# Patient Record
Sex: Female | Born: 1940
Health system: Southern US, Community
[De-identification: ages and names within clinical notes are randomized; demographics above are authoritative.]

## PROBLEM LIST (undated history)

## (undated) DIAGNOSIS — Z8639 Personal history of other endocrine, nutritional and metabolic disease: Secondary | ICD-10-CM

## (undated) DIAGNOSIS — F419 Anxiety disorder, unspecified: Secondary | ICD-10-CM

## (undated) DIAGNOSIS — I10 Essential (primary) hypertension: Secondary | ICD-10-CM

## (undated) DIAGNOSIS — R600 Localized edema: Secondary | ICD-10-CM

## (undated) DIAGNOSIS — K760 Fatty (change of) liver, not elsewhere classified: Secondary | ICD-10-CM

## (undated) DIAGNOSIS — M545 Low back pain, unspecified: Secondary | ICD-10-CM

## (undated) DIAGNOSIS — R2689 Other abnormalities of gait and mobility: Secondary | ICD-10-CM

## (undated) DIAGNOSIS — G4700 Insomnia, unspecified: Secondary | ICD-10-CM

## (undated) DIAGNOSIS — R55 Syncope and collapse: Secondary | ICD-10-CM

## (undated) DIAGNOSIS — R569 Unspecified convulsions: Secondary | ICD-10-CM

## (undated) DIAGNOSIS — F329 Major depressive disorder, single episode, unspecified: Secondary | ICD-10-CM

## (undated) DIAGNOSIS — F418 Other specified anxiety disorders: Secondary | ICD-10-CM

## (undated) DIAGNOSIS — C50919 Malignant neoplasm of unspecified site of unspecified female breast: Secondary | ICD-10-CM

## (undated) DIAGNOSIS — M159 Polyosteoarthritis, unspecified: Secondary | ICD-10-CM

## (undated) DIAGNOSIS — M5410 Radiculopathy, site unspecified: Secondary | ICD-10-CM

## (undated) DIAGNOSIS — L719 Rosacea, unspecified: Secondary | ICD-10-CM

## (undated) DIAGNOSIS — W19XXXA Unspecified fall, initial encounter: Secondary | ICD-10-CM

## (undated) DIAGNOSIS — Z9010 Acquired absence of unspecified breast and nipple: Secondary | ICD-10-CM

## (undated) DIAGNOSIS — M810 Age-related osteoporosis without current pathological fracture: Secondary | ICD-10-CM

## (undated) DIAGNOSIS — R251 Tremor, unspecified: Secondary | ICD-10-CM

## (undated) DIAGNOSIS — E785 Hyperlipidemia, unspecified: Secondary | ICD-10-CM

## (undated) DIAGNOSIS — C801 Malignant (primary) neoplasm, unspecified: Secondary | ICD-10-CM

## (undated) HISTORY — DX: Age-related osteoporosis without current pathological fracture: M81.0

## (undated) HISTORY — DX: Polyosteoarthritis, unspecified: M15.9

## (undated) HISTORY — DX: Rosacea, unspecified: L71.9

## (undated) HISTORY — DX: Major depressive disorder, single episode, unspecified: F32.9

## (undated) HISTORY — DX: Malignant (primary) neoplasm, unspecified: C80.1

## (undated) HISTORY — DX: Syncope and collapse: R55

## (undated) HISTORY — DX: Hyperlipidemia, unspecified: E78.5

## (undated) HISTORY — DX: Insomnia, unspecified: G47.00

## (undated) HISTORY — DX: Other specified anxiety disorders: F41.8

## (undated) HISTORY — PX: MASTECTOMY: SHX3

## (undated) HISTORY — PX: CATARACT EXTRACTION: SUR2

## (undated) HISTORY — DX: Unspecified fall, initial encounter: W19.XXXA

## (undated) HISTORY — DX: Fatty (change of) liver, not elsewhere classified: K76.0

## (undated) HISTORY — PX: TONSILLECTOMY: SUR1361

## (undated) HISTORY — DX: Localized edema: R60.0

## (undated) HISTORY — DX: Unspecified convulsions: R56.9

## (undated) HISTORY — DX: Anxiety disorder, unspecified: F41.9

## (undated) HISTORY — DX: Essential (primary) hypertension: I10

## (undated) HISTORY — DX: Personal history of other endocrine, nutritional and metabolic disease: Z86.39

## (undated) HISTORY — DX: Radiculopathy, site unspecified: M54.10

## (undated) HISTORY — DX: Other abnormalities of gait and mobility: R26.89

## (undated) HISTORY — DX: Malignant neoplasm of unspecified site of unspecified female breast: C50.919

## (undated) HISTORY — DX: Low back pain, unspecified: M54.50

## (undated) HISTORY — DX: Acquired absence of unspecified breast and nipple: Z90.10

## (undated) HISTORY — DX: Tremor, unspecified: R25.1

---
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
## 1992-06-26 DIAGNOSIS — C801 Malignant (primary) neoplasm, unspecified: Secondary | ICD-10-CM

## 1992-06-26 HISTORY — PX: BREAST SURGERY: SHX581

## 1992-06-26 HISTORY — DX: Malignant (primary) neoplasm, unspecified: C80.1

## 2000-11-21 ENCOUNTER — Ambulatory Visit (HOSPITAL_COMMUNITY): Admission: RE | Admit: 2000-11-21 | Discharge: 2000-11-21 | Payer: Self-pay | Admitting: Pulmonary Disease

## 2001-03-05 ENCOUNTER — Emergency Department (HOSPITAL_COMMUNITY): Admission: EM | Admit: 2001-03-05 | Discharge: 2001-03-05 | Payer: Self-pay | Admitting: Emergency Medicine

## 2001-03-05 ENCOUNTER — Encounter: Payer: Self-pay | Admitting: Emergency Medicine

## 2001-10-16 ENCOUNTER — Ambulatory Visit (HOSPITAL_COMMUNITY): Admission: RE | Admit: 2001-10-16 | Discharge: 2001-10-16 | Payer: Self-pay | Admitting: Pulmonary Disease

## 2001-10-22 ENCOUNTER — Ambulatory Visit (HOSPITAL_COMMUNITY): Admission: RE | Admit: 2001-10-22 | Discharge: 2001-10-22 | Payer: Self-pay | Admitting: Pulmonary Disease

## 2001-12-16 ENCOUNTER — Other Ambulatory Visit: Admission: RE | Admit: 2001-12-16 | Discharge: 2001-12-16 | Payer: Self-pay | Admitting: Gynecology

## 2002-12-19 ENCOUNTER — Other Ambulatory Visit: Admission: RE | Admit: 2002-12-19 | Discharge: 2002-12-19 | Payer: Self-pay | Admitting: Gynecology

## 2003-06-02 ENCOUNTER — Ambulatory Visit (HOSPITAL_COMMUNITY): Admission: RE | Admit: 2003-06-02 | Discharge: 2003-06-02 | Payer: Self-pay | Admitting: Pulmonary Disease

## 2003-12-23 ENCOUNTER — Other Ambulatory Visit: Admission: RE | Admit: 2003-12-23 | Discharge: 2003-12-23 | Payer: Self-pay | Admitting: Gynecology

## 2004-03-09 ENCOUNTER — Ambulatory Visit (HOSPITAL_COMMUNITY): Admission: RE | Admit: 2004-03-09 | Discharge: 2004-03-09 | Payer: Self-pay | Admitting: Internal Medicine

## 2005-01-02 ENCOUNTER — Other Ambulatory Visit: Admission: RE | Admit: 2005-01-02 | Discharge: 2005-01-02 | Payer: Self-pay | Admitting: Gynecology

## 2005-04-12 ENCOUNTER — Ambulatory Visit (HOSPITAL_COMMUNITY): Admission: RE | Admit: 2005-04-12 | Discharge: 2005-04-12 | Payer: Self-pay | Admitting: Pulmonary Disease

## 2005-04-13 ENCOUNTER — Ambulatory Visit: Payer: Self-pay | Admitting: Cardiology

## 2006-01-04 ENCOUNTER — Other Ambulatory Visit: Admission: RE | Admit: 2006-01-04 | Discharge: 2006-01-04 | Payer: Self-pay | Admitting: Gynecology

## 2006-11-20 ENCOUNTER — Ambulatory Visit: Payer: Self-pay | Admitting: Oncology

## 2007-01-04 ENCOUNTER — Ambulatory Visit: Payer: Self-pay | Admitting: Oncology

## 2007-01-23 ENCOUNTER — Encounter: Admission: RE | Admit: 2007-01-23 | Discharge: 2007-01-23 | Payer: Self-pay | Admitting: Oncology

## 2007-02-01 ENCOUNTER — Encounter: Admission: RE | Admit: 2007-02-01 | Discharge: 2007-02-01 | Payer: Self-pay | Admitting: Otolaryngology

## 2008-01-08 ENCOUNTER — Ambulatory Visit: Payer: Self-pay | Admitting: Oncology

## 2008-01-13 LAB — LACTATE DEHYDROGENASE: LDH: 177 U/L (ref 94–250)

## 2008-01-13 LAB — COMPREHENSIVE METABOLIC PANEL
Alkaline Phosphatase: 61 U/L (ref 39–117)
BUN: 22 mg/dL (ref 6–23)
CO2: 27 mEq/L (ref 19–32)
Creatinine, Ser: 0.81 mg/dL (ref 0.40–1.20)
Glucose, Bld: 88 mg/dL (ref 70–99)
Total Bilirubin: 0.4 mg/dL (ref 0.3–1.2)
Total Protein: 7.6 g/dL (ref 6.0–8.3)

## 2008-01-13 LAB — CBC WITH DIFFERENTIAL/PLATELET
Basophils Absolute: 0 10*3/uL (ref 0.0–0.1)
Eosinophils Absolute: 0.1 10*3/uL (ref 0.0–0.5)
HCT: 40.3 % (ref 34.8–46.6)
LYMPH%: 20.3 % (ref 14.0–48.0)
MCV: 87.5 fL (ref 81.0–101.0)
MONO#: 0.5 10*3/uL (ref 0.1–0.9)
MONO%: 8 % (ref 0.0–13.0)
NEUT#: 4 10*3/uL (ref 1.5–6.5)
NEUT%: 68.6 % (ref 39.6–76.8)
Platelets: 190 10*3/uL (ref 145–400)
WBC: 5.9 10*3/uL (ref 3.9–10.0)

## 2009-01-19 ENCOUNTER — Ambulatory Visit: Payer: Self-pay | Admitting: Oncology

## 2009-01-22 ENCOUNTER — Encounter: Admission: RE | Admit: 2009-01-22 | Discharge: 2009-01-22 | Payer: Self-pay | Admitting: Oncology

## 2009-01-22 LAB — CBC WITH DIFFERENTIAL/PLATELET
EOS%: 2.6 % (ref 0.0–7.0)
Eosinophils Absolute: 0.1 10*3/uL (ref 0.0–0.5)
LYMPH%: 25.2 % (ref 14.0–49.7)
MCH: 29.9 pg (ref 25.1–34.0)
MCV: 88.4 fL (ref 79.5–101.0)
MONO%: 9.8 % (ref 0.0–14.0)
Platelets: 170 10*3/uL (ref 145–400)
RBC: 4.41 10*6/uL (ref 3.70–5.45)
RDW: 14 % (ref 11.2–14.5)

## 2009-01-22 LAB — COMPREHENSIVE METABOLIC PANEL
AST: 28 U/L (ref 0–37)
Albumin: 4.5 g/dL (ref 3.5–5.2)
Alkaline Phosphatase: 79 U/L (ref 39–117)
BUN: 18 mg/dL (ref 6–23)
Glucose, Bld: 90 mg/dL (ref 70–99)
Potassium: 4.5 mEq/L (ref 3.5–5.3)
Sodium: 141 mEq/L (ref 135–145)
Total Bilirubin: 0.6 mg/dL (ref 0.3–1.2)
Total Protein: 6.8 g/dL (ref 6.0–8.3)

## 2010-02-04 ENCOUNTER — Ambulatory Visit: Payer: Self-pay | Admitting: Oncology

## 2010-02-09 ENCOUNTER — Ambulatory Visit: Payer: Self-pay | Admitting: Vascular Surgery

## 2010-02-09 ENCOUNTER — Ambulatory Visit
Admission: RE | Admit: 2010-02-09 | Discharge: 2010-02-09 | Payer: Self-pay | Source: Home / Self Care | Admitting: Oncology

## 2010-06-09 ENCOUNTER — Ambulatory Visit: Payer: Self-pay | Admitting: Otolaryngology

## 2010-11-11 NOTE — Op Note (Signed)
NAME:  Tammy Terry, Tammy Terry                          ACCOUNT NO.:  [id number]   MEDICAL RECORD NO.:  [id number]                   PATIENT TYPE:  AMB   LOCATION:  DAY                                  FACILITY:  [facility]   PHYSICIAN:  Lionel December, M.D.                 DATE OF BIRTH:  1940-08-17   DATE OF PROCEDURE:  03/09/2004  DATE OF DISCHARGE:                                 OPERATIVE REPORT   PROCEDURE:  Total colonoscopy.   INDICATIONS:  Tammy Terry is a 70 year old Caucasian female who is undergoing  screening colonoscopy. She does not have any GI symptoms. Personal history  is significant for breast carcinoma which remains in remission. There is no  family history of colon carcinoma. Procedure and risks were reviewed with  the patient and informed consent was obtained.   PREOPERATIVE MEDICATIONS:  Demerol 50 mg IV, Versed 6 mg IV in divided  doses.   FINDINGS:  Procedure performed in endoscopy suite. The patient's vital signs  and O2 saturations were monitored during procedure and remained stable. The  patient was placed in the left lateral position and rectal examination  performed. No abnormality noted on external or digital exam. Olympus video  scope was placed in the rectum and advanced into sigmoid colon and beyond.  Preparation was satisfactory. She had some liquid stool scattered here and  there which was suctioned out. Scope was advanced to the cecum which was  identified by appendiceal orifice and ileocecal valve. Picture taken for the  record. As the scope was withdrawn, colonic mucosa was once again carefully  examined, and there were no polyps and/or other abnormalities. The rectal  mucosa similarly was normal. Scope was retroflexed to examine anorectal  junction which was unremarkable. The scope was straightened and withdrawn.  The patient tolerated the procedure well.   FINAL DIAGNOSIS:  Normal colonoscopy.   RECOMMENDATIONS:  She should continue yearly Hemoccults and  consider next  reexamine 10 years for now.      ___________________________________________                                            Lionel December, M.D.   NR/MEDQ  D:  03/09/2004  T:  03/09/2004  Job:  [id number]   cc:   Gretta Cool, M.D.  [address] [place]  [place] [address]  Fax: [phone]   Oneal Deputy. Juanetta Gosling, M.D.  [address]  [place]  [place] [address]  Fax: [phone]

## 2010-12-08 ENCOUNTER — Ambulatory Visit (INDEPENDENT_AMBULATORY_CARE_PROVIDER_SITE_OTHER): Payer: Medicare Other | Admitting: Otolaryngology

## 2010-12-08 DIAGNOSIS — H60399 Other infective otitis externa, unspecified ear: Secondary | ICD-10-CM

## 2011-02-10 ENCOUNTER — Other Ambulatory Visit: Payer: Self-pay | Admitting: Oncology

## 2011-02-10 ENCOUNTER — Encounter (HOSPITAL_BASED_OUTPATIENT_CLINIC_OR_DEPARTMENT_OTHER): Payer: Medicare Other | Admitting: Oncology

## 2011-02-10 DIAGNOSIS — M7989 Other specified soft tissue disorders: Secondary | ICD-10-CM

## 2011-02-10 DIAGNOSIS — C50519 Malignant neoplasm of lower-outer quadrant of unspecified female breast: Secondary | ICD-10-CM

## 2011-02-10 LAB — COMPREHENSIVE METABOLIC PANEL
ALT: 15 U/L (ref 0–35)
AST: 18 U/L (ref 0–37)
Albumin: 4.3 g/dL (ref 3.5–5.2)
Alkaline Phosphatase: 67 U/L (ref 39–117)
Calcium: 10.5 mg/dL (ref 8.4–10.5)
Chloride: 101 mEq/L (ref 96–112)
Potassium: 4 mEq/L (ref 3.5–5.3)
Sodium: 138 mEq/L (ref 135–145)
Total Protein: 7.4 g/dL (ref 6.0–8.3)

## 2011-02-10 LAB — PROTIME-INR
INR: 0.9 — ABNORMAL LOW (ref 2.00–3.50)
Protime: 10.8 Seconds (ref 10.6–13.4)

## 2011-02-10 LAB — CBC WITH DIFFERENTIAL/PLATELET
BASO%: 0.4 % (ref 0.0–2.0)
Basophils Absolute: 0 10*3/uL (ref 0.0–0.1)
EOS%: 1.8 % (ref 0.0–7.0)
HGB: 12.8 g/dL (ref 11.6–15.9)
MCH: 30.6 pg (ref 25.1–34.0)
MCHC: 34.2 g/dL (ref 31.5–36.0)
MCV: 89.6 fL (ref 79.5–101.0)
MONO%: 7.3 % (ref 0.0–14.0)
RBC: 4.18 10*6/uL (ref 3.70–5.45)
RDW: 14.1 % (ref 11.2–14.5)
lymph#: 1.1 10*3/uL (ref 0.9–3.3)

## 2011-06-01 ENCOUNTER — Ambulatory Visit (INDEPENDENT_AMBULATORY_CARE_PROVIDER_SITE_OTHER): Payer: Medicare Other | Admitting: Otolaryngology

## 2011-06-01 DIAGNOSIS — H903 Sensorineural hearing loss, bilateral: Secondary | ICD-10-CM

## 2012-01-10 ENCOUNTER — Encounter: Payer: Self-pay | Admitting: Oncology

## 2012-01-19 ENCOUNTER — Telehealth: Payer: Self-pay | Admitting: Oncology

## 2012-01-19 NOTE — Telephone Encounter (Signed)
lmonvm for pt confirming appt for 9/6. Also checked w/Mlissa @ [facility] and per [place] pt has annual mammo 12/25/11.

## 2012-01-19 NOTE — Telephone Encounter (Signed)
Pt called back to let me know she did get my message re 9/6 appt and she had her mammo 12/25/11 @ [facility].

## 2012-02-02 ENCOUNTER — Ambulatory Visit: Payer: Medicare Other | Admitting: Oncology

## 2012-03-01 ENCOUNTER — Telehealth: Payer: Self-pay | Admitting: Oncology

## 2012-03-01 ENCOUNTER — Other Ambulatory Visit (HOSPITAL_BASED_OUTPATIENT_CLINIC_OR_DEPARTMENT_OTHER): Payer: Medicare Other | Admitting: Lab

## 2012-03-01 ENCOUNTER — Ambulatory Visit (HOSPITAL_BASED_OUTPATIENT_CLINIC_OR_DEPARTMENT_OTHER): Payer: Medicare Other | Admitting: Oncology

## 2012-03-01 ENCOUNTER — Encounter: Payer: Self-pay | Admitting: Oncology

## 2012-03-01 VITALS — BP 186/95 | HR 59 | Temp 97.3°F | Resp 18 | Ht 66.0 in | Wt 170.9 lb

## 2012-03-01 DIAGNOSIS — F341 Dysthymic disorder: Secondary | ICD-10-CM

## 2012-03-01 DIAGNOSIS — E785 Hyperlipidemia, unspecified: Secondary | ICD-10-CM | POA: Insufficient documentation

## 2012-03-01 DIAGNOSIS — C50919 Malignant neoplasm of unspecified site of unspecified female breast: Secondary | ICD-10-CM | POA: Insufficient documentation

## 2012-03-01 DIAGNOSIS — M7989 Other specified soft tissue disorders: Secondary | ICD-10-CM

## 2012-03-01 DIAGNOSIS — Z853 Personal history of malignant neoplasm of breast: Secondary | ICD-10-CM

## 2012-03-01 DIAGNOSIS — C50519 Malignant neoplasm of lower-outer quadrant of unspecified female breast: Secondary | ICD-10-CM

## 2012-03-01 DIAGNOSIS — F418 Other specified anxiety disorders: Secondary | ICD-10-CM

## 2012-03-01 HISTORY — DX: Hyperlipidemia, unspecified: E78.5

## 2012-03-01 HISTORY — DX: Other specified anxiety disorders: F41.8

## 2012-03-01 LAB — LACTATE DEHYDROGENASE (CC13): LDH: 176 U/L (ref 125–220)

## 2012-03-01 LAB — COMPREHENSIVE METABOLIC PANEL (CC13)
ALT: 13 U/L (ref 0–55)
Albumin: 4.1 g/dL (ref 3.5–5.0)
CO2: 27 mEq/L (ref 22–29)
Chloride: 105 mEq/L (ref 98–107)
Potassium: 4.3 mEq/L (ref 3.5–5.1)
Sodium: 142 mEq/L (ref 136–145)
Total Bilirubin: 0.8 mg/dL (ref 0.20–1.20)
Total Protein: 6.8 g/dL (ref 6.4–8.3)

## 2012-03-01 LAB — CBC WITH DIFFERENTIAL/PLATELET
BASO%: 0.5 % (ref 0.0–2.0)
EOS%: 1.5 % (ref 0.0–7.0)
HCT: 41 % (ref 34.8–46.6)
LYMPH%: 18.3 % (ref 14.0–49.7)
MCH: 30.2 pg (ref 25.1–34.0)
MCHC: 33 g/dL (ref 31.5–36.0)
NEUT%: 70.6 % (ref 38.4–76.8)
RBC: 4.48 10*6/uL (ref 3.70–5.45)
lymph#: 1.1 10*3/uL (ref 0.9–3.3)

## 2012-03-01 NOTE — Progress Notes (Signed)
Hematology and Oncology Follow Up Visit  Tammy Terry [id number] 02-Aug-1940 71 y.o. 03/01/2012 3:51 PM   Principle Diagnosis: Encounter Diagnoses  Name Primary?  . Breast cancer, stage 3 Yes  . Depression with anxiety   . Hyperlipidemia      Interim History:   Follow-up visit for this pleasant 71 year old lady with a history of stage III invasive lobular carcinoma of the left breast diagnosed in April 1994, 5-cm primary, ER/PR positive, one node positive, positive nipple involvement.  Positive lymphatic invasion.  She was treated with left mastectomy, eight cycles of CMF chemotherapy, chest wall radiation, and 5 years of tamoxifen.  She has done extremely well with no signs of obvious recurrence and now out over 19 years. I have given her the option of graduating from our office that she still likes to see me once a year.  Medically she has had no interim problems but psychologically she has become significantly depressed and anxious. She really doesn't understand why. There have been no major events that might have brought on a depression. She is feeling more and more isolated and feels she can't be separated from her husband for any length of time. 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 She has been on chronic low-dose Xanax 0.5 mg twice daily and Celexa 10 mg daily. She did see a psychologist in the past but didn't feel that this helped and stopped going after 2 visits. She has discussed her depression with her gynecologist and will be seeing her primary care physician next week.  She denies any headache. She has had a change in vision. She had a left cataract surgery a few months ago. This did not improve her vision. Which affects both eyes. She is reluctant to have the right cataract surgery done. She denies any bone pain. No change in bowel habit. No vaginal bleeding.  Medications: reviewed  Allergies:  Allergies  Allergen  Reactions  . Amoxicillin   . Codeine     Review of Systems: Constitutional: No constitutional symptoms   Respiratory: No cough or dyspnea Cardiovascular:  No chest pain or palpitations Gastrointestinal: See above Genito-Urinary: See above  Musculoskeletal: See above Neurologic: See above Skin: No rash or ecchymosis Remaining ROS negative.  Physical Exam: Blood pressure 186/95, pulse 59, temperature 97.3 F (36.3 C), temperature source Oral, resp. rate 18, height 5\' 6"  (1.676 m), weight 170 lb 14.4 oz (77.52 kg). Wt Readings from Last 3 Encounters:  03/01/12 170 lb 14.4 oz (77.52 kg)     General appearance: Well-nourished Caucasian woman HENNT: Pharynx no erythema or exudate Lymph nodes: No lymphadenopathy Breasts: Left mastectomy, no chest wall lesions, no right breast masses. Lungs: Clear to auscultation resonant to percussion Heart: Regular rhythm no murmur Abdomen: Soft nontender no mass no organomegaly Extremities: No edema no calf tenderness Vascular: No cyanosis Neurologic: Motor strength 5 over 5, reflexes 1+ symmetric Skin: No rash or ecchymosis  Lab Results: Lab Results  Component Value Date   WBC 6.0 03/01/2012   HGB 13.5 03/01/2012   HCT 41.0 03/01/2012   MCV 91.5 03/01/2012   PLT 141* 03/01/2012     Chemistry      Component Value Date/Time   NA 142 03/01/2012 1009   NA 138 02/10/2011 1529   K 4.3 03/01/2012 1009   K 4.0 02/10/2011 1529   CL 105 03/01/2012 1009   CL 101 02/10/2011 1529   CO2 27  03/01/2012 1009   CO2 27 02/10/2011 1529   BUN 20.0 03/01/2012 1009   BUN 19 02/10/2011 1529   CREATININE 0.8 03/01/2012 1009   CREATININE 0.73 02/10/2011 1529      Component Value Date/Time   CALCIUM 9.8 03/01/2012 1009   CALCIUM 10.5 02/10/2011 1529   ALKPHOS 73 03/01/2012 1009   ALKPHOS 67 02/10/2011 1529   AST 14 03/01/2012 1009   AST 18 02/10/2011 1529   ALT 13 03/01/2012 1009   ALT 15 02/10/2011 1529   BILITOT 0.80 03/01/2012 1009   BILITOT 0.4 02/10/2011 1529        Radiological Studies: Mammograms done back in January 28, 2023 on the right breast showed no new disease   Impression and Plan:  #1. Stage IIIA invasive lobular carcinoma left breast treated as outlined above. She remains free of any obvious new disease now out a remarkable 19 years. Plan: Continue annual followup  #2. Depression with anxiety. I think she might benefit from psychiatric evaluation.. I gave her the name of Dr. Geralyn Flash a good psychiatrist in [place] where she lives. She'll discuss this with her primary care physician next week.   CC:. Dr. Kari Baars; Beather Arbour; Charlean Merl, MD 9/6/20133:51 PM

## 2012-03-01 NOTE — Telephone Encounter (Signed)
[facility] @ [facility] for mammogram appt on 12/30/12 10 am date per patient's request. Order was also faxed to [facility] scheduling Pt will see MD with lab on 01/31/13

## 2012-05-30 ENCOUNTER — Ambulatory Visit (INDEPENDENT_AMBULATORY_CARE_PROVIDER_SITE_OTHER): Payer: Medicare Other | Admitting: Otolaryngology

## 2012-05-30 DIAGNOSIS — H903 Sensorineural hearing loss, bilateral: Secondary | ICD-10-CM

## 2012-05-30 DIAGNOSIS — H6980 Other specified disorders of Eustachian tube, unspecified ear: Secondary | ICD-10-CM

## 2012-05-30 DIAGNOSIS — H93299 Other abnormal auditory perceptions, unspecified ear: Secondary | ICD-10-CM

## 2012-09-11 ENCOUNTER — Other Ambulatory Visit (HOSPITAL_COMMUNITY): Payer: Self-pay | Admitting: Pulmonary Disease

## 2012-09-17 ENCOUNTER — Ambulatory Visit (HOSPITAL_COMMUNITY)
Admission: RE | Admit: 2012-09-17 | Discharge: 2012-09-17 | Disposition: A | Discharge status: Home / Self Care | Payer: Medicare Other | Source: Ambulatory Visit | Attending: Pulmonary Disease | Admitting: Pulmonary Disease

## 2012-09-17 DIAGNOSIS — K7689 Other specified diseases of liver: Secondary | ICD-10-CM | POA: Insufficient documentation

## 2012-09-17 DIAGNOSIS — R748 Abnormal levels of other serum enzymes: Secondary | ICD-10-CM | POA: Insufficient documentation

## 2012-10-10 ENCOUNTER — Encounter (INDEPENDENT_AMBULATORY_CARE_PROVIDER_SITE_OTHER): Payer: Self-pay | Admitting: *Deleted

## 2012-10-22 ENCOUNTER — Encounter (INDEPENDENT_AMBULATORY_CARE_PROVIDER_SITE_OTHER): Payer: Self-pay | Admitting: Internal Medicine

## 2012-10-22 ENCOUNTER — Ambulatory Visit (INDEPENDENT_AMBULATORY_CARE_PROVIDER_SITE_OTHER): Payer: Medicare Other | Admitting: Internal Medicine

## 2012-10-22 VITALS — BP 144/60 | HR 76 | Ht 65.0 in | Wt 171.6 lb

## 2012-10-22 DIAGNOSIS — I10 Essential (primary) hypertension: Secondary | ICD-10-CM | POA: Insufficient documentation

## 2012-10-22 DIAGNOSIS — K7689 Other specified diseases of liver: Secondary | ICD-10-CM

## 2012-10-22 DIAGNOSIS — K760 Fatty (change of) liver, not elsewhere classified: Secondary | ICD-10-CM

## 2012-10-22 DIAGNOSIS — R748 Abnormal levels of other serum enzymes: Secondary | ICD-10-CM | POA: Insufficient documentation

## 2012-10-22 LAB — COMPREHENSIVE METABOLIC PANEL
ALT: 24 U/L (ref 0–35)
AST: 18 U/L (ref 0–37)
Chloride: 102 mEq/L (ref 96–112)
Creat: 0.71 mg/dL (ref 0.50–1.10)
Total Bilirubin: 0.8 mg/dL (ref 0.3–1.2)

## 2012-10-22 NOTE — Progress Notes (Signed)
Subjective:     Patient ID: Tammy Terry, female   DOB: 08/11/1940, 72 y.o.   MRN: [id number]  Tammy Terry is a 72 yr old female referred to our office for elevated liver enzymes. She was also noted have a fatty liver. She denies prior hx of elevated liver enzymes. She has been off the cholesterol medication since February of this year. Appetite is good. No weight.  No abdominal pain. BMs are normal; every 3 days.   09/11/2012 US abdomen: IMPRESSION:  Fatty infiltration of the liver. No other abnormality seen in the  abdomen.   08/23/2012 ALP 94, AST 36, ALT 46  CMP     Component Value Date/Time   NA 142 03/01/2012 1009   NA 138 02/10/2011 1529   K 4.3 03/01/2012 1009   K 4.0 02/10/2011 1529   CL 105 03/01/2012 1009   CL 101 02/10/2011 1529   CO2 27 03/01/2012 1009   CO2 27 02/10/2011 1529   GLUCOSE 69* 03/01/2012 1009   GLUCOSE 96 02/10/2011 1529   BUN 20.0 03/01/2012 1009   BUN 19 02/10/2011 1529   CREATININE 0.8 03/01/2012 1009   CREATININE 0.73 02/10/2011 1529   CALCIUM 9.8 03/01/2012 1009   CALCIUM 10.5 02/10/2011 1529   PROT 6.8 03/01/2012 1009   PROT 7.4 02/10/2011 1529   ALBUMIN 4.1 03/01/2012 1009   ALBUMIN 4.3 02/10/2011 1529   AST 14 03/01/2012 1009   AST 18 02/10/2011 1529   ALT 13 03/01/2012 1009   ALT 15 02/10/2011 1529   ALKPHOS 73 03/01/2012 1009   ALKPHOS 67 02/10/2011 1529   BILITOT 0.80 03/01/2012 1009   BILITOT 0.4 02/10/2011 1529      Review of Systems see hpi Current Outpatient Prescriptions  Medication Sig Dispense Refill  . amLODipine (NORVASC) 2.5 MG tablet Take 2.5 mg by mouth daily.      Marland Kitchen aspirin 81 MG tablet Take 81 mg by mouth daily.      . Cholecalciferol (VITAMIN D-3 PO) Take 2,000 Int'l Units by mouth daily.      . citalopram (CELEXA) 10 MG tablet Take 20 mg by mouth daily.       . clonazePAM (KLONOPIN) 0.5 MG tablet Take 0.5 mg by mouth 2 (two) times daily as needed for anxiety.      . fish oil-omega-3 fatty acids 1000 MG capsule Take 1 g by mouth daily.      . Misc  Natural Products (OSTEO BI-FLEX ADV TRIPLE ST PO) Take 2 tablets by mouth daily.      . polyethylene glycol (MIRALAX / GLYCOLAX) packet Take 17 g by mouth daily as needed.       No current facility-administered medications for this visit.   Past Medical History  Diagnosis Date  . Depression with anxiety 03/01/2012  . Hyperlipidemia 03/01/2012  . Hypertension    Past Surgical History  Procedure Laterality Date  . Mastectomy      left breast for cancer in 1994   Allergies  Allergen Reactions  . Amoxicillin   . Codeine         Objective:   Physical Exam  Filed Vitals:   10/22/12 1021  BP: 144/60  Pulse: 76  Height: 5\' 5"  (1.651 m)  Weight: 171 lb 9.6 oz (77.837 kg)   Alert and oriented. Skin warm and dry. Oral mucosa is moist.   . Sclera anicteric, conjunctivae is pink. Thyroid not enlarged. No cervical lymphadenopathy. Lungs clear. Heart regular rate and rhythm.  Abdomen  is soft.  Abdomen obese. Bowel sounds are positive. No hepatomegaly. No abdominal masses felt. No tenderness.  No edema to lower extremities.       Assessment:    Elevated transaminases. ALT slightly elevated. Fatty infiltration of the liver on [place].  Zocor on hold.     Plan:    Repeat Cmet today. OV in 3 months with a Cmet. Patient needs to diet and exercise.

## 2012-10-22 NOTE — Patient Instructions (Addendum)
Diet and exercise. Cmet in 3 months

## 2012-10-23 ENCOUNTER — Telehealth (INDEPENDENT_AMBULATORY_CARE_PROVIDER_SITE_OTHER): Payer: Self-pay | Admitting: *Deleted

## 2012-10-23 DIAGNOSIS — K760 Fatty (change of) liver, not elsewhere classified: Secondary | ICD-10-CM

## 2012-10-23 NOTE — Telephone Encounter (Signed)
.  Per Terri Setzer,NP 

## 2012-10-30 ENCOUNTER — Encounter (INDEPENDENT_AMBULATORY_CARE_PROVIDER_SITE_OTHER): Payer: Self-pay

## 2012-12-18 ENCOUNTER — Encounter (INDEPENDENT_AMBULATORY_CARE_PROVIDER_SITE_OTHER): Payer: Self-pay | Admitting: *Deleted

## 2012-12-18 ENCOUNTER — Other Ambulatory Visit (INDEPENDENT_AMBULATORY_CARE_PROVIDER_SITE_OTHER): Payer: Self-pay | Admitting: *Deleted

## 2012-12-18 DIAGNOSIS — K760 Fatty (change of) liver, not elsewhere classified: Secondary | ICD-10-CM

## 2013-01-02 ENCOUNTER — Telehealth: Payer: Self-pay | Admitting: *Deleted

## 2013-01-02 NOTE — Telephone Encounter (Signed)
Received call from [facility] stating pt needs a recall for diagnostic & has made an appt for tues @ 10am & needs an order.  Returned call & asked her to fax order for MD to sign.

## 2013-01-13 ENCOUNTER — Telehealth: Payer: Self-pay | Admitting: *Deleted

## 2013-01-13 ENCOUNTER — Other Ambulatory Visit: Payer: Self-pay | Admitting: Oncology

## 2013-01-13 DIAGNOSIS — R921 Mammographic calcification found on diagnostic imaging of breast: Secondary | ICD-10-CM

## 2013-01-13 NOTE — Telephone Encounter (Signed)
Patient presents to office asking for advice on how to schedule right breast core biopsy that was suggested by [facility] at [facility]. Had called office and not heard back last week. Made her aware that nurse had attempted to reach her several times last week at her home # (confirmed #) without success. Today this RN called her home and was only able to get rapid busy signal. Patient will have her phone checked. Was provided with her cell [phone] as back up. Left messages with [facility] of [place] [phone] ext [phone] to contact our office/patient to schedule core biopsy if they are able to do this for patient. Also instructed patient to call them again when she returns home and give her cell # in addition to home # for contact. She will call our office when biopsy is scheduled to determine if Dr. Cyndie Chime wants to move her 01/31/13 annual office visit.

## 2013-01-13 NOTE — Telephone Encounter (Signed)
[facility] called and said they will contact patient to set up biopsy at the [facility] in [place]. Will send orders over for Dr. Cyndie Chime to sign.

## 2013-01-15 ENCOUNTER — Telehealth: Payer: Self-pay | Admitting: *Deleted

## 2013-01-15 NOTE — Telephone Encounter (Signed)
Patient called and left message that her right breast biopsy is scheduled for Wed. 7/30 at 3pm at the [facility]  She thinks her phone is fine.  She does have a cell phone but it is not turned on all the time.  She left her email so we could communicate.  Tried to call her back and let her know that we need to communicate with her by phone, we are unable to use personal email for communication.  No answer at her home phone and unable to leave a message on this number. No answer on cell number and no mailbox set up on it.

## 2013-01-18 LAB — COMPREHENSIVE METABOLIC PANEL
CO2: 30 mEq/L (ref 19–32)
Creat: 0.74 mg/dL (ref 0.50–1.10)
Glucose, Bld: 90 mg/dL (ref 70–99)
Total Bilirubin: 0.8 mg/dL (ref 0.3–1.2)

## 2013-01-21 ENCOUNTER — Ambulatory Visit (INDEPENDENT_AMBULATORY_CARE_PROVIDER_SITE_OTHER): Payer: Medicare Other | Admitting: Internal Medicine

## 2013-01-22 ENCOUNTER — Ambulatory Visit
Admission: RE | Admit: 2013-01-22 | Discharge: 2013-01-22 | Disposition: A | Discharge status: Home / Self Care | Payer: Medicare Other | Source: Ambulatory Visit | Attending: Oncology | Admitting: Oncology

## 2013-01-22 ENCOUNTER — Encounter: Payer: Self-pay | Admitting: Oncology

## 2013-01-22 DIAGNOSIS — R921 Mammographic calcification found on diagnostic imaging of breast: Secondary | ICD-10-CM

## 2013-01-27 ENCOUNTER — Ambulatory Visit: Payer: Self-pay | Admitting: Gynecology

## 2013-01-27 ENCOUNTER — Encounter: Payer: Self-pay | Admitting: Gynecology

## 2013-01-27 ENCOUNTER — Ambulatory Visit (INDEPENDENT_AMBULATORY_CARE_PROVIDER_SITE_OTHER): Payer: Medicare Other | Admitting: Gynecology

## 2013-01-27 VITALS — BP 146/86 | Ht 64.5 in | Wt 166.0 lb

## 2013-01-27 DIAGNOSIS — N951 Menopausal and female climacteric states: Secondary | ICD-10-CM

## 2013-01-27 DIAGNOSIS — M949 Disorder of cartilage, unspecified: Secondary | ICD-10-CM

## 2013-01-27 DIAGNOSIS — N952 Postmenopausal atrophic vaginitis: Secondary | ICD-10-CM

## 2013-01-27 DIAGNOSIS — Z780 Asymptomatic menopausal state: Secondary | ICD-10-CM

## 2013-01-27 DIAGNOSIS — M8580 Other specified disorders of bone density and structure, unspecified site: Secondary | ICD-10-CM

## 2013-01-27 DIAGNOSIS — Z853 Personal history of malignant neoplasm of breast: Secondary | ICD-10-CM

## 2013-01-27 NOTE — Progress Notes (Signed)
Tammy Terry 10/18/1940 [id number]   History:    72 y.o. new patient to the practice who was previously been followed by Dr. Nicholas Lose who has retired. Patient with long-standing history of osteopenia as well as vaginal atrophy. Patient is here for gynecological exam.  Patient withhistory of stage III invasive lobular carcinoma of the left breast diagnosed in April 1994, 5-cm primary, ER/PR positive, one node positive, positive nipple involvement. Positive lymphatic invasion. She was treated with left mastectomy, eight cycles of CMF chemotherapy, chest wall radiation, and 5 years of tamoxifen. She has been followed by Dr. Cyndie Chime   her oncologist. Early this year patient had a right breast biopsy the pathology report was fibroadenoma.  Patient does suffer from depression and anxiety this being followed by her psychiatrist. she has been taking Xanax 0.5 mg twice a day when necessary  For anxiety along with Celexa 10 mg daily.  Patient would know prior history of abnormal Pap smears. Her primary physician is Dr. Kari Baars who has been doing her lab work in treating her for hypercholesterolemia. Patient's last colonoscopy was in 2005. Her last bone density study was in 2012 with osteopenia being diagnosed.    Past medical history,surgical history, family history and social history were all reviewed and documented in the EPIC chart.  Gynecologic History No LMP recorded. Patient is postmenopausal. Contraception: post menopausal status Last Pap: 2010. Results were: normal Last mammogram: see above. Results were: see above  Obstetric History OB History   Grav Para Term Preterm Abortions TAB SAB Ect Mult Living   1 1        1      # Outc Date GA Lbr Len/2nd Wgt Sex Del Anes PTL Lv   1 PAR                ROS: A ROS was performed and pertinent positives and negatives are included in the history.  GENERAL: No fevers or chills. HEENT: No change in vision, no earache, sore throat or sinus  congestion. NECK: No pain or stiffness. CARDIOVASCULAR: No chest pain or pressure. No palpitations. PULMONARY: No shortness of breath, cough or wheeze. GASTROINTESTINAL: No abdominal pain, nausea, vomiting or diarrhea, melena or bright red blood per rectum. GENITOURINARY: No urinary frequency, urgency, hesitancy or dysuria. MUSCULOSKELETAL: No joint or muscle pain, no back pain, no recent trauma. DERMATOLOGIC: No rash, no itching, no lesions. ENDOCRINE: No polyuria, polydipsia, no heat or cold intolerance. No recent change in weight. HEMATOLOGICAL: No anemia or easy bruising or bleeding. NEUROLOGIC: No headache, seizures, numbness, tingling or weakness. PSYCHIATRIC: No depression, no loss of interest in normal activity or change in sleep pattern.     Exam: chaperone present  BP 146/86  Ht 5' 4.5" (1.638 m)  Wt 166 lb (75.297 kg)  BMI 28.06 kg/m2  Body mass index is 28.06 kg/(m^2).  General appearance : Well developed well nourished female. No acute distress HEENT: Neck supple, trachea midline, no carotid bruits, no thyroidmegaly Lungs: Clear to auscultation, no rhonchi or wheezes, or rib retractions  Heart: Regular rate and rhythm, no murmurs or gallops Breast:Examined in sitting and supine position were symmetrical in appearance, no palpable masses or tenderness,  no skin retraction, no nipple inversion, no nipple discharge, no skin discoloration, no axillary or supraclavicular lymphadenopathy Abdomen: no palpable masses or tenderness, no rebound or guarding Extremities: no edema or skin discoloration or tenderness  Pelvic:  Bartholin, Urethra, Skene Glands: Within normal limits  Vagina: No gross lesions or discharge,atrophic changes  Cervix: No gross lesions or discharge  Uterus  axial, normal size, shape and consistency, non-tender and mobile  Adnexa  Without masses or tenderness  Anus and perineum  normal   Rectovaginal  normal sphincter tone without palpated masses or  tenderness             Hemoccult cords provided     Assessment/Plan:  72 y.o. female with history of osteopenia overdue for her bone density study which she will schedule. She will bring to the office the same day the Hemoccult cards for testing. I gave her information on Evista which she would benefit since she has had history of breast cancer and  with history of osteopenia. We will discuss further after the upcoming bone density study. Patient did state that she had been several years ago on Fosamax for greater than 5 years. Pap smear not done today for a new guidelines were discussed. She was reminded to continue doing monthly self breast examination. We discussed importance of calcium vitamin D and regular exercise for osteoporosis prevention.   Ok Edwards MD, 1:21 PM 01/27/2013

## 2013-01-27 NOTE — Patient Instructions (Addendum)
Raloxifene tablets What is this medicine? RALOXIFENE (ral OX i feen) reduces the amount of calcium lost from bones. It is used to treat and prevent osteoporosis in women who have experienced menopause. This medicine may be used for other purposes; ask your health care provider or pharmacist if you have questions. What should I tell my health care provider before I take this medicine? They need to know if you have any of these conditions: -a history of blood clots -cancer -heart failure -liver disease -premenopausal -an unusual or allergic reaction to raloxifene, other medicines, foods, dyes, or preservatives -pregnant or trying to get pregnant -breast-feeding How should I use this medicine? Take this medicine by mouth with a glass of water. Follow the directions on the prescription label. The tablets can be taken with or without food. Take your doses at regular intervals. Do not take your medicine more often than directed. Talk to your pediatrician regarding the use of this medicine in children. Special care may be needed. Overdosage: If you think you have taken too much of this medicine contact a poison control center or emergency room at once. NOTE: This medicine is only for you. Do not share this medicine with others. What if I miss a dose? If you miss a dose, take it as soon as you can. If it is almost time for your next dose, take only that dose. Do not take double or extra doses. What may interact with this medicine? -ampicillin -cholestyramine -colestipol -diazepam -diazoxide -female hormones like hormone replacement therapy -lidocaine -warfarin This list may not describe all possible interactions. Give your health care provider a list of all the medicines, herbs, non-prescription drugs, or dietary supplements you use. Also tell them if you smoke, drink alcohol, or use illegal drugs. Some items may interact with your medicine. What should I watch for while using this  medicine? Visit your doctor or health care professional for regular checks on your progress. Do not stop taking this medicine except on the advice of your doctor or health care professional. You should make sure you get enough calcium and vitamin D in your diet while you are taking this medicine. Discuss your dietary needs with your health care professional or nutritionist. Exercise may help to prevent bone loss. Discuss your exercise needs with your doctor or health care professional. This medicine can rarely cause blood clots. You should avoid long periods of bed rest while taking this medicine. If you are going to have surgery, tell your doctor or health care professional that you are taking this medicine. This medicine should be stopped at least 3 days before surgery. After surgery, it should be restarted only after you are walking again. It should not be restarted while you still need long periods of bed rest. You should not smoke while taking this medicine. Smoking may also increase your risk of blood clots. Smoking can also decrease the effects of this medicine. This medicine does not prevent hot flashes. It may cause hot flashes in some patients at the start of therapy. What side effects may I notice from receiving this medicine? Side effects that you should report to your doctor or health care professional as soon as possible: -change in vision -chest pain -difficulty breathing -leg pain or swelling -skin rash, itching Side effects that usually do not require medical attention (report to your doctor or health care professional if they continue or are bothersome): -fluid build-up -leg cramps -stomach pain -sweating This list may not describe all possible side  effects. Call your doctor for medical advice about side effects. You may report side effects to FDA at 1-800-FDA-1088. Where should I keep my medicine? Keep out of the reach of children. Store at room temperature between 15 and 30  degrees C (59 and 86 degrees F). Throw away any unused medicine after the expiration date. NOTE: This sheet is a summary. It may not cover all possible information. If you have questions about this medicine, talk to your doctor, pharmacist, or health care provider.  2013, [employer]vier/Gold Standard. (05/28/2008 3:15:14 PM) Bone Densitometry Bone densitometry is a special X-ray that measures your bone density and can be used to help predict your risk of bone fractures. This test is used to determine bone mineral content and density to diagnose osteoporosis. Osteoporosis is the loss of bone that may cause the bone to become weak. Osteoporosis commonly occurs in women entering menopause. However, it may be found in men and in people with other diseases. PREPARATION FOR TEST No preparation necessary. WHO SHOULD BE TESTED?  All women older than 85.  Postmenopausal women (50 to 44) with risk factors for osteoporosis.  People with a previous fracture caused by normal activities.  People with a small body frame (less than 127 poundsor a body mass index [BMI] of less than 21).  People who have a parent with a hip fracture or history of osteoporosis.  People who smoke.  People who have rheumatoid arthritis.  Anyone who engages in excessive alcohol use (more than 3 drinks most days).  Women who experience early menopause. WHEN SHOULD YOU BE RETESTED? Current guidelines suggest that you should wait at least 2 years before doing a bone density test again if your first test was normal.Recent studies indicated that women with normal bone density may be able to wait a few years before needing to repeat a bone density test. You should discuss this with your caregiver.  NORMAL FINDINGS   Normal: less than standard deviation below normal (greater than -1).  Osteopenia: 1 to 2.5 standard deviations below normal (-1 to -2.5).  Osteoporosis: greater than 2.5 standard deviations below normal (less than  -2.5). Test results are reported as a "T score" and a "Z score."The T score is a number that compares your bone density with the bone density of healthy, young women.The Z score is a number that compares your bone density with the scores of women who are the same age, gender, and race.  Ranges for normal findings may vary among different laboratories and hospitals. You should always check with your doctor after having lab work or other tests done to discuss the meaning of your test results and whether your values are considered within normal limits. MEANING OF TEST  Your caregiver will go over the test results with you and discuss the importance and meaning of your results, as well as treatment options and the need for additional tests if necessary. OBTAINING THE TEST RESULTS It is your responsibility to obtain your test results. Ask the lab or department performing the test when and how you will get your results. Document Released: 07/04/2004 Document Revised: 09/04/2011 Document Reviewed: 07/27/2010 [facility] Patient Information 2014 [place], [place].

## 2013-01-29 ENCOUNTER — Telehealth: Payer: Self-pay | Admitting: *Deleted

## 2013-01-29 NOTE — Telephone Encounter (Signed)
Received message from pt asking to confirm appt for 01/31/13.  Attempted several times home # --received busy signal; when it did ring--no answering machine.  Will continue to try and confirm appt for pt.

## 2013-01-31 ENCOUNTER — Telehealth: Payer: Self-pay | Admitting: Oncology

## 2013-01-31 ENCOUNTER — Ambulatory Visit (HOSPITAL_BASED_OUTPATIENT_CLINIC_OR_DEPARTMENT_OTHER): Payer: Medicare Other | Admitting: Oncology

## 2013-01-31 ENCOUNTER — Other Ambulatory Visit (HOSPITAL_BASED_OUTPATIENT_CLINIC_OR_DEPARTMENT_OTHER): Payer: Medicare Other | Admitting: Lab

## 2013-01-31 VITALS — BP 151/72 | HR 61 | Temp 97.9°F | Resp 18 | Ht 64.0 in | Wt 167.4 lb

## 2013-01-31 DIAGNOSIS — F341 Dysthymic disorder: Secondary | ICD-10-CM

## 2013-01-31 DIAGNOSIS — Z853 Personal history of malignant neoplasm of breast: Secondary | ICD-10-CM

## 2013-01-31 DIAGNOSIS — C50912 Malignant neoplasm of unspecified site of left female breast: Secondary | ICD-10-CM

## 2013-01-31 DIAGNOSIS — C50919 Malignant neoplasm of unspecified site of unspecified female breast: Secondary | ICD-10-CM

## 2013-01-31 LAB — COMPREHENSIVE METABOLIC PANEL (CC13)
ALT: 12 U/L (ref 0–55)
BUN: 20.9 mg/dL (ref 7.0–26.0)
CO2: 27 mEq/L (ref 22–29)
Calcium: 9.8 mg/dL (ref 8.4–10.4)
Creatinine: 0.8 mg/dL (ref 0.6–1.1)
Total Bilirubin: 0.68 mg/dL (ref 0.20–1.20)

## 2013-01-31 LAB — CBC WITH DIFFERENTIAL/PLATELET
BASO%: 0.8 % (ref 0.0–2.0)
Basophils Absolute: 0 10*3/uL (ref 0.0–0.1)
EOS%: 1.5 % (ref 0.0–7.0)
HCT: 37.9 % (ref 34.8–46.6)
HGB: 12.7 g/dL (ref 11.6–15.9)
LYMPH%: 22.8 % (ref 14.0–49.7)
MCH: 30.6 pg (ref 25.1–34.0)
MCHC: 33.6 g/dL (ref 31.5–36.0)
NEUT%: 65.1 % (ref 38.4–76.8)
Platelets: 141 10*3/uL — ABNORMAL LOW (ref 145–400)

## 2013-01-31 NOTE — Telephone Encounter (Signed)
gv and printed appt sched and avs for pt...sched mamo at [facility] in [place].Marland KitchenMarland Kitchen

## 2013-02-03 NOTE — Progress Notes (Signed)
Hematology and Oncology Follow Up Visit  Tammy Terry [id number] 1941-04-12 72 y.o. 02/03/2013 8:40 AM   Principle Diagnosis: Encounter Diagnosis  Name Primary?  . Breast cancer, stage 3, left Yes     Interim History:   Follow-up visit for this pleasant 72 year old lady with a history of stage III invasive lobular carcinoma of the left breast diagnosed in April 1994, 5-cm primary, ER/PR positive, one node positive, positive nipple involvement. Positive lymphatic invasion. She was treated with left mastectomy, eight cycles of CMF chemotherapy, chest wall radiation, and 5 years of tamoxifen. She has done extremely well with no signs of obvious recurrence and now out over 19 years. I have given her the option of graduating from our office that she still likes to see me once a year. Recent routine followup mammogram done 12/30/2012 showed an area of clustered calcifications in the right breast. She was called back. A biopsy was done on July 30. Fortunately this just showed a benign fibroadenoma and no invasive cancer.  At time of visit last year she was severely depressed. She did take steps to help herself. She is seeing a psychiatrist and a psychologist. She reports significant progress. Current dose of Celexa 20 mg daily with Klonopin 0.5 mg twice daily as needed for anxiety.  No other interim medical problems. She denies any severe headache or change in vision, no bone pain, no change in bowel habit, no vaginal bleeding or discharge.   Medications: reviewed  Allergies:  Allergies  Allergen Reactions  . Amoxicillin   . Codeine     Review of Systems: Constitutional:   No constitutional symptoms Respiratory: No cough or dyspnea Cardiovascular:  No chest pain or palpitations Gastrointestinal: See above Genito-Urinary: See above Musculoskeletal: See above Neurologic: See above Skin: No rash Remaining ROS negative.  Physical Exam: Blood pressure 151/72, pulse 61, temperature  97.9 F (36.6 C), temperature source Oral, resp. rate 18, height 5\' 4"  (1.626 m), weight 167 lb 6.4 oz (75.932 kg). Wt Readings from Last 3 Encounters:  01/31/13 167 lb 6.4 oz (75.932 kg)  01/27/13 166 lb (75.297 kg)  10/22/12 171 lb 9.6 oz (77.837 kg)     General appearance: Well-nourished Caucasian woman HENNT: Pharynx no erythema or exudate Lymph nodes: No cervical, supraclavicular, or axillary adenopathy Breasts: Left mastectomy. No chest wall lesions. No palpable right breast masses. Ecchymosis at site of recent biopsy. Lungs: Clear to auscultation resonant to percussion Heart: Regular rhythm no murmur Abdomen: Soft, nontender, no mass, no organomegaly Extremities: No edema, no calf tenderness Musculoskeletal: No joint deformities GU: Vascular: No carotid bruits, no cyanosis Neurologic: Mental status intact, cranial nerves grossly normal, motor strength 5 over 5, reflexes 1+ symmetric Skin: No rash or ecchymosis  Lab Results: Lab Results  Component Value Date   WBC 4.5 01/31/2013   HGB 12.7 01/31/2013   HCT 37.9 01/31/2013   MCV 91.3 01/31/2013   PLT 141* 01/31/2013     Chemistry      Component Value Date/Time   NA 143 01/31/2013 1253   NA 142 01/17/2013 0112   K 4.1 01/31/2013 1253   K 4.9 01/17/2013 0112   CL 104 01/17/2013 0112   CL 105 03/01/2012 1009   CO2 27 01/31/2013 1253   CO2 30 01/17/2013 0112   BUN 20.9 01/31/2013 1253   BUN 18 01/17/2013 0112   CREATININE 0.8 01/31/2013 1253   CREATININE 0.74 01/17/2013 0112   CREATININE 0.73 02/10/2011 1529      Component Value  Date/Time   CALCIUM 9.8 01/31/2013 1253   CALCIUM 9.7 01/17/2013 0112   ALKPHOS 69 01/31/2013 1253   ALKPHOS 63 01/17/2013 0112   AST 14 01/31/2013 1253   AST 18 01/17/2013 0112   ALT 12 01/31/2013 1253   ALT 14 01/17/2013 0112   BILITOT 0.68 01/31/2013 1253   BILITOT 0.8 01/17/2013 0112       Radiological Studies: Mm Rt Breast Bx W Loc Dev 1st Lesion Image Bx Spec Stereo Guide  01/23/2013   **ADDENDUM** CREATED:  01/23/2013 10:18:45  Final pathology demonstrates FIBROADENOMA WITH CALCIFICATIONS - NO MALIGNANCY. Histology correlates with imaging findings.  The patient was contacted by phone on 01/23/2013 and these results given to her which she understood. Her questions were answered. The patient had no complaints with her biopsy site.  Recommend right diagnostic mammogram in 6 months to follow up other likely benign right breast calcifications.  **END ADDENDUM** SIGNED BY: Tinnie Gens T. Si Gaul, M.D.  01/22/2013   *RADIOLOGY REPORT*  Clinical Data:  Right breast calcifications  STEREOTACTIC-GUIDED VACUUM ASSISTED BIOPSY OF THE RIGHT BREAST AND SPECIMEN RADIOGRAPH  Comparison: Mammograms dated 01/07/2013 and 12/30/2012 from [facility]  I met with the patient and we discussed the procedure of stereotactic-guided biopsy, including benefits and alternatives. We discussed the high likelihood of a successful procedure. We discussed the risks of the procedure, including infection, bleeding, tissue injury, clip migration, and inadequate sampling. Informed, written consent was given. The usual time-out protocol was performed immediately prior to the procedure.  Using sterile technique and 2% Lidocaine as local anesthetic, under stereotactic guidance, a 9 gauge vacuum-assisted device was used to perform core needle biopsy of calcifications in the posterior third of the medial aspect of the right breast using a medial approach. Specimen radiograph was performed, showing calcifications are present the tissue samples.  Specimens with calcifications are identified for pathology.  At the conclusion of the procedure, a tophat tissue marker clip was deployed into the biopsy cavity.  Follow-up 2-view mammogram confirmed clip placement.  IMPRESSION: Stereotactic-guided biopsy of calcifications in the posterior third of the medial right breast.  No apparent complications.   Original Report Authenticated By: Baird Lyons, M.D.     Impression: #1. Stage IIIA invasive lobular carcinoma left breast treated as outlined above. She remains free of any obvious new disease now out a remarkable 20 years.   She recently established with a new gynecologist. He asked her to consider use of Evista for osteoporosis prevention with secondary benefit for breast cancer prevention. I told her I personally do not use Evista. It was only studied for breast cancer prevention in a trial of women who did not have a prior diagnosis of breast cancer. It was never studied in any population of patients with diagnosed breast cancer. Although it does not stimulate the endometrium like tamoxifen does, it is not any less thrombogenic than tamoxifen with approximate 1% per year chance of thrombotic events including stroke which is increased in women over the age of 95. She is 20 years without breast cancer recurrence. I do not feel she needs to be put on a preventive drug. There are safer medications available to use to prevent osteoporosis. I would recommend either a single annual dose of Reclast or a biannual dose of Zometa.  #2. Recent 01/22/2013 right breast biopsy with findings of benign fibroadenoma  #3. Depression with anxiety. Much better controlled with combination of antidepressant and counseling.     CC:. Dr. Kari Baars; Dr. Lars Mage  Lily Peer; Dr. Geralyn Flash   Levert Feinstein, MD 8/11/20148:40 AM

## 2013-02-19 ENCOUNTER — Encounter: Payer: Self-pay | Admitting: Gynecology

## 2013-02-27 ENCOUNTER — Ambulatory Visit (INDEPENDENT_AMBULATORY_CARE_PROVIDER_SITE_OTHER): Payer: Medicare Other

## 2013-02-27 DIAGNOSIS — M899 Disorder of bone, unspecified: Secondary | ICD-10-CM

## 2013-02-27 DIAGNOSIS — M8580 Other specified disorders of bone density and structure, unspecified site: Secondary | ICD-10-CM

## 2013-02-27 DIAGNOSIS — Z780 Asymptomatic menopausal state: Secondary | ICD-10-CM

## 2013-03-05 ENCOUNTER — Other Ambulatory Visit: Payer: Medicare Other | Admitting: Anesthesiology

## 2013-03-05 DIAGNOSIS — Z1211 Encounter for screening for malignant neoplasm of colon: Secondary | ICD-10-CM

## 2013-03-05 LAB — FECAL OCCULT BLOOD, IMMUNOCHEMICAL: IFOBT: NEGATIVE

## 2013-03-18 ENCOUNTER — Encounter: Payer: Self-pay | Admitting: Gynecology

## 2013-03-18 ENCOUNTER — Ambulatory Visit (INDEPENDENT_AMBULATORY_CARE_PROVIDER_SITE_OTHER): Payer: Medicare Other | Admitting: Gynecology

## 2013-03-18 VITALS — BP 132/80

## 2013-03-18 DIAGNOSIS — M899 Disorder of bone, unspecified: Secondary | ICD-10-CM

## 2013-03-18 DIAGNOSIS — M8580 Other specified disorders of bone density and structure, unspecified site: Secondary | ICD-10-CM | POA: Insufficient documentation

## 2013-03-18 NOTE — Progress Notes (Signed)
Patient is a 72 year old who presented to the office today to discuss her recent bone density study. Patient stated that for over 10 years she had been on Fosamax has a prior history of osteoporosis. Patient has been off of the Fosamax for greater than 5 years.  Patient withhistory of stage III invasive lobular carcinoma of the left breast diagnosed in April 1994, 5-cm primary, ER/PR positive, one node positive, positive nipple involvement. Positive lymphatic invasion. She was treated with left mastectomy, eight cycles of CMF chemotherapy, chest wall radiation, and 5 years of tamoxifen. She has been followed by Dr. Cyndie Chime her oncologist. Early this year patient had a right breast biopsy the pathology report was fibroadenoma. Patient has been 20 years without breast cancer recurrence and the oncologist and not recommend putting her on Evista but perhaps a single dose Reclast  IV once a year.  Recent bone density study done here at our office had indicated that her lowest T. Score was at the left femoral neck with a value of -1.8. Her 10 year fracture risk of the hip is 3.8% exceeding 3.0% threshold. Patient's bone mass is 25% below normal. The patient has a 7 times greater risk of hip fracture and 5 time greater risk of spine fracture when compared to a younger population.   Prior bone density study done in a different facility with a different machine so comparison not as accurate. Based on patient's age of 26 and her past history of osteoporosis she would benefit by Recalst IV or Actonel 150 mg q. Monthly. The risks benefits and pros and cons of both medication were discussed to include osteonecrosis of the jaw and spontaneous fracture of the hip. We discussed the importance of calcium 1200 mg daily and vitamin D 2000 units daily as well as weightbearing exercises. Patient's recent BUN, creatinine and calcium levels were normal on August 8. Literature information was provided. We would need to do a bone  density study one year after initiating therapy to monitor response. Literature and information was provided.

## 2013-03-18 NOTE — Patient Instructions (Addendum)
Calcium Carbonate; Risedronate tablets What is this medicine? CALCIUM CARBONATE; RISEDRONATE (KAL see um KAR bon ate; ris ED roe nate) reduces calcium loss from bones. It is used to prevent and to treat osteoporosis. This medicine may be used for other purposes; ask your health care provider or pharmacist if you have questions. What should I tell my health care provider before I take this medicine? They need to know if you have any of these conditions: -dental disease -esophageal, stomach, or intestinal problems, like acid reflux or GERD -hyperparathyroidism -kidney stones or disease -low or high blood calcium -problems sitting or standing for 30 minutes -trouble swallowing -an unusual or allergic reaction to calcium, risedronate, other bisphosphonates or medicines, foods, dyes, or preservatives -pregnant or trying to get pregnant -breast-feeding How should I use this medicine? Follow the directions on the prescription label. Take risedronate on the same day of the week, every week. Take calcium on the other 6 days of the week. On the day you take the risedronate: You must take this medicine exactly as directly or you will lower the amount of the medicine that you absorb into your body or you may cause yourself harm. Take this medicine by mouth first thing in the morning, after you are up for the day. Do not eat or drink anything before you take this medicine. Swallow the tablet with a full glass (6 to 8 ounces) of plain water. Do not take this medicine with any other drink. Do not chew or crush the tablet. After taking this medicine, do not eat breakfast, drink, or take any other medicines or vitamins for at least 30 minutes. Stand or sit up for at least 30 minutes after you take this medicine; do not lie down. Take this medicine on the same day every week. Do not take your medicine more often than directed. On the days you take the calcium: Take your calcium tablet with food on the 6 days of  the week that you do not take risedronate. Do not take other medicines at the same time as your calcium. Talk to your pediatrician regarding the use of this medicine in children. Special care may be needed. Overdosage: If you think you have taken too much of this medicine contact a poison control center or emergency room at once. NOTE: This medicine is only for you. Do not share this medicine with others. What if I miss a dose? If you miss a dose of risedronate, take the dose on the morning after you remember. Then take your next dose on your regular scheduled day of the week. Never take 2 tablets on the same day. Do not take double or extra doses. If you miss a dose of calcium, take it as soon as you remember with your next meal. Do not take 2 tablets with the same meal. Do not take calcium and risedronate tablets at the same time. What may interact with this medicine? Do not take this medicine with any of the following medications: -ammonium chloride -methenamine This medicine may also interact with the following medications: -antacids like aluminum hydroxide or magnesium hydroxide -antibiotics like ciprofloxacin, tetracycline and others -aspirin -calcium supplements -drugs for inflammation like ibuprofen, naproxen, and others -iron supplements -magnesium supplements -NSAIDs, medicines for pain and inflammation, like ibuprofen or naproxen -steroid medicines like prednisone or cortisone -thiazide diuretics -thyroid hormones -vitamins with minerals This list may not describe all possible interactions. Give your health care provider a list of all the medicines, herbs, non-prescription drugs, or  dietary supplements you use. Also tell them if you smoke, drink alcohol, or use illegal drugs. Some items may interact with your medicine. What should I watch for while using this medicine? Visit your doctor or health care professional for regular check ups. It may be some time before you see the  benefit from this medicine. Your doctor may order blood tests or other tests to see how you are doing. You should make sure that you get enough calcium and vitamin D while you are taking this medicine. Discuss the foods you eat and the vitamins you take with your health care professional. Some people who take this medicine have severe bone, joint, and/or muscle pain. This medicine may also increase your risk for a broken thigh bone. Tell your doctor right away if you have pain in your upper leg or groin. Tell your doctor if you have any pain that does not go away or that gets worse. What side effects may I notice from receiving this medicine? Side effects that you should report to your doctor as soon as possible: -allergic reactions like skin rash, itching or hives, swelling of the face, lips, or tongue -breathing problems -black or tarry stools -changes in vision -heartburn or stomach pain -jaw pain, especially after dental work -pain or difficulty when swallowing -redness, blistering, peeling, or loosening of the skin, including inside the mouth -unusually weak or tired Side effects that usually do not require medical attention (report to your doctor if they continue or are bothersome): -bone, muscle, or joint pain -changes in taste -diarrhea or constipation -eye pain or itching -headache -nausea or vomiting -stomach gas or fullness This list may not describe all possible side effects. Call your doctor for medical advice about side effects. You may report side effects to FDA at 1-800-FDA-1088. Where should I keep my medicine? Keep out of the reach of children. Store at room temperature between 15 and 30 degrees C (59 and 86 degrees F). Throw away any unused medicine after the expiration date. NOTE: This sheet is a summary. It may not cover all possible information. If you have questions about this medicine, talk to your doctor, pharmacist, or health care provider.  2013, Elsevier/Gold  Standard. (12/12/2010 9:47:05 AM) Zoledronic Acid injection (Paget's Disease, Osteoporosis) What is this medicine? ZOLEDRONIC ACID (ZOE le dron ik AS id) lowers the amount of calcium loss from bone. It is used to treat Paget's disease and osteoporosis in women. This medicine may be used for other purposes; ask your health care provider or pharmacist if you have questions. What should I tell my health care provider before I take this medicine? They need to know if you have any of these conditions: -aspirin-sensitive asthma -dental disease -kidney disease -low levels of calcium in the blood -past surgery on the parathyroid gland or intestines -an unusual or allergic reaction to zoledronic acid, other medicines, foods, dyes, or preservatives -pregnant or trying to get pregnant -breast-feeding How should I use this medicine? This medicine is for infusion into a vein. It is given by a health care professional in a hospital or clinic setting. Talk to your pediatrician regarding the use of this medicine in children. This medicine is not approved for use in children. Overdosage: If you think you have taken too much of this medicine contact a poison control center or emergency room at once. NOTE: This medicine is only for you. Do not share this medicine with others. What if I miss a dose? It is important not  to miss your dose. Call your doctor or health care professional if you are unable to keep an appointment. What may interact with this medicine? -certain antibiotics given by injection -NSAIDs, medicines for pain and inflammation, like ibuprofen or naproxen -some diuretics like bumetanide, furosemide -teriparatide This list may not describe all possible interactions. Give your health care provider a list of all the medicines, herbs, non-prescription drugs, or dietary supplements you use. Also tell them if you smoke, drink alcohol, or use illegal drugs. Some items may interact with your  medicine. What should I watch for while using this medicine? Visit your doctor or health care professional for regular checkups. It may be some time before you see the benefit from this medicine. Do not stop taking your medicine unless your doctor tells you to. Your doctor may order blood tests or other tests to see how you are doing. Women should inform their doctor if they wish to become pregnant or think they might be pregnant. There is a potential for serious side effects to an unborn child. Talk to your health care professional or pharmacist for more information. You should make sure that you get enough calcium and vitamin D while you are taking this medicine. Discuss the foods you eat and the vitamins you take with your health care professional. Some people who take this medicine have severe bone, joint, and/or muscle pain. This medicine may also increase your risk for a broken thigh bone. Tell your doctor right away if you have pain in your upper leg or groin. Tell your doctor if you have any pain that does not go away or that gets worse. What side effects may I notice from receiving this medicine? Side effects that you should report to your doctor or health care professional as soon as possible: -allergic reactions like skin rash, itching or hives, swelling of the face, lips, or tongue -breathing problems -changes in vision -feeling faint or lightheaded, falls -jaw burning, cramping, or pain -muscle cramps, stiffness, or weakness -trouble passing urine or change in the amount of urine Side effects that usually do not require medical attention (report to your doctor or health care professional if they continue or are bothersome): -bone, joint, or muscle pain -fever -irritation at site where injected -loss of appetite -nausea, vomiting -stomach upset -tired This list may not describe all possible side effects. Call your doctor for medical advice about side effects. You may report side  effects to FDA at 1-800-FDA-1088. Where should I keep my medicine? This drug is given in a hospital or clinic and will not be stored at home. NOTE: This sheet is a summary. It may not cover all possible information. If you have questions about this medicine, talk to your doctor, pharmacist, or health care provider.  2013, Elsevier/Gold Standard. (12/09/2010 9:08:15 AM) Osteoporosis Throughout your life, your body breaks down old bone and replaces it with new bone. As you get older, your body does not replace bone as quickly as it breaks it down. By the age of 30 years, most people begin to gradually lose bone because of the imbalance between bone loss and replacement. Some people lose more bone than others. Bone loss beyond a specified normal degree is considered osteoporosis.  Osteoporosis affects the strength and durability of your bones. The inside of the ends of your bones and your flat bones, like the bones of your pelvis, look like honeycomb, filled with tiny open spaces. As bone loss occurs, your bones become less dense. This  means that the open spaces inside your bones become bigger and the walls between these spaces become thinner. This makes your bones weaker. Bones of a person with osteoporosis can become so weak that they can break (fracture) during minor accidents, such as a simple fall. CAUSES  The following factors have been associated with the development of osteoporosis:  Smoking.  Drinking more than 2 alcoholic drinks several days per week.  Long-term use of certain medicines:  Corticosteroids.  Chemotherapy medicines.  Thyroid medicines.  Antiepileptic medicines.  Gonadal hormone suppression medicine.  Immunosuppression medicine.  Being underweight.  Lack of physical activity.  Lack of exposure to the sun. This can lead to vitamin D deficiency.  Certain medical conditions:  Certain inflammatory bowel diseases, such as Crohn's disease and ulcerative  colitis.  Diabetes.  Hyperthyroidism.  Hyperparathyroidism. RISK FACTORS Anyone can develop osteoporosis. However, the following factors can increase your risk of developing osteoporosis:  Gender Women are at higher risk than men.  Age Being older than 50 years increases your risk.  Ethnicity White and Asian people have an increased risk.  Weight Being extremely underweight can increase your risk of osteoporosis.  Family history of osteoporosis Having a family member who has developed osteoporosis can increase your risk. SYMPTOMS  Usually, people with osteoporosis have no symptoms.  DIAGNOSIS  Signs during a physical exam that may prompt your caregiver to suspect osteoporosis include:  Decreased height. This is usually caused by the compression of the bones that form your spine (vertebrae) because they have weakened and become fractured.  A curving or rounding of the upper back (kyphosis). To confirm signs of osteoporosis, your caregiver may request a procedure that uses 2 low-dose X-ray beams with different levels of energy to measure your bone mineral density (dual-energy X-ray absorptiometry [DXA]). Also, your caregiver may check your level of vitamin D. TREATMENT  The goal of osteoporosis treatment is to strengthen bones in order to decrease the risk of bone fractures. There are different types of medicines available to help achieve this goal. Some of these medicines work by slowing the processes of bone loss. Some medicines work by increasing bone density. Treatment also involves making sure that your levels of calcium and vitamin D are adequate. PREVENTION  There are things you can do to help prevent osteoporosis. Adequate intake of calcium and vitamin D can help you achieve optimal bone mineral density. Regular exercise can also help, especially resistance and weight-bearing activities. If you smoke, quitting smoking is an important part of osteoporosis prevention. MAKE SURE  YOU:  Understand these instructions.  Will watch your condition.  Will get help right away if you are not doing well or get worse. Document Released: 03/22/2005 Document Revised: 05/29/2012 Document Reviewed: 05/27/2011 [facility] Patient Information 2014 [place], [place].

## 2013-03-21 ENCOUNTER — Encounter: Payer: Self-pay | Admitting: Oncology

## 2013-03-27 ENCOUNTER — Telehealth: Payer: Self-pay | Admitting: *Deleted

## 2013-03-27 NOTE — Telephone Encounter (Signed)
I have called the patient's insurance to verify benefits for reclast, [phone] does not need PA. Outpatient covered at a $100 copay. Pt's diagnosis is 733.01, Osteoporosis. Pt had a fragility fracture and has been on treatment for Osteoporosis. Has an elevated hip fracture risk. Pt needs a current creatinine and calcium level drawn and once I get the results I will call her back to schedule the infusion at [facility]. KW CMA

## 2013-03-28 ENCOUNTER — Encounter (INDEPENDENT_AMBULATORY_CARE_PROVIDER_SITE_OTHER): Payer: Self-pay | Admitting: *Deleted

## 2013-03-31 ENCOUNTER — Other Ambulatory Visit: Payer: Self-pay | Admitting: Gynecology

## 2013-03-31 LAB — CREATININE, SERUM: Creat: 0.66 mg/dL (ref 0.50–1.10)

## 2013-04-02 NOTE — Telephone Encounter (Signed)
I have received labs, they are normal, reclast scheduled at [facility] 10/13 Mon at 12, arrive at 1145. Instructions mailed and informed verbally. Order to be faxed KW

## 2013-04-03 NOTE — Telephone Encounter (Signed)
Just to add, I spoke with Loraine Leriche at patients insurance, [facility]. KW

## 2013-04-04 ENCOUNTER — Other Ambulatory Visit (HOSPITAL_COMMUNITY): Payer: Self-pay | Admitting: *Deleted

## 2013-04-07 ENCOUNTER — Ambulatory Visit (HOSPITAL_COMMUNITY)
Admission: RE | Admit: 2013-04-07 | Discharge: 2013-04-07 | Disposition: A | Discharge status: Home / Self Care | Payer: Medicare Other | Source: Ambulatory Visit | Attending: Gynecology | Admitting: Gynecology

## 2013-04-07 DIAGNOSIS — M810 Age-related osteoporosis without current pathological fracture: Secondary | ICD-10-CM | POA: Insufficient documentation

## 2013-04-07 MED ORDER — ZOLEDRONIC ACID 5 MG/100ML IV SOLN
INTRAVENOUS | Status: AC
  Administered 2013-04-07: 5 mg
  Filled 2013-04-07: qty 100

## 2013-04-07 MED ORDER — ZOLEDRONIC ACID 5 MG/100ML IV SOLN: 5.0000 mg | Freq: Once | INTRAVENOUS | Status: DC

## 2013-04-23 ENCOUNTER — Ambulatory Visit (INDEPENDENT_AMBULATORY_CARE_PROVIDER_SITE_OTHER): Payer: Medicare Other | Admitting: Internal Medicine

## 2013-04-23 ENCOUNTER — Encounter (INDEPENDENT_AMBULATORY_CARE_PROVIDER_SITE_OTHER): Payer: Self-pay | Admitting: Internal Medicine

## 2013-04-23 VITALS — BP 122/60 | HR 76 | Temp 98.5°F | Ht 65.5 in | Wt 166.7 lb

## 2013-04-23 DIAGNOSIS — R748 Abnormal levels of other serum enzymes: Secondary | ICD-10-CM

## 2013-04-23 DIAGNOSIS — K760 Fatty (change of) liver, not elsewhere classified: Secondary | ICD-10-CM

## 2013-04-23 DIAGNOSIS — K7689 Other specified diseases of liver: Secondary | ICD-10-CM

## 2013-04-23 NOTE — Patient Instructions (Signed)
OV in 1 yr. Continue to exercise

## 2013-04-23 NOTE — Progress Notes (Signed)
Subjective:     Patient ID: Tammy Terry, female   DOB: 1940/09/22, 72 y.o.   MRN: [id number]  HPI Here today for f/u of her elevated liver enzymes and fatty liver. Since her last visit she has lost about 5 pounds.  There has been no abdominal pain. Appetite is good. No melena or bright red rectal bleeding Her last liver enzymes were normal.  She is trying to exercise. Recently started on Reclast yearly for osteoporosis.   08/2012 US abdomen: IMPRESSION:  Fatty infiltration of the liver. No other abnormality seen in the  abdomen.  Hx of stage 111 invasive lobular carcinoma of the left breast diagnosed in April of 1994.  She underwent a left mastectomy, eight cycles of CMF chemotherapy, chest wall radiation, and 5 yrs of tamoxifen. Followed by Dr. Cyndie Chime.     CMP     Component Value Date/Time   NA 143 01/31/2013 1253   NA 142 01/17/2013 0112   K 4.1 01/31/2013 1253   K 4.9 01/17/2013 0112   CL 104 01/17/2013 0112   CL 105 03/01/2012 1009   CO2 27 01/31/2013 1253   CO2 30 01/17/2013 0112   GLUCOSE 99 01/31/2013 1253   GLUCOSE 90 01/17/2013 0112   GLUCOSE 69* 03/01/2012 1009   BUN 20.9 01/31/2013 1253   BUN 18 01/17/2013 0112   CREATININE 0.66 03/31/2013 1057   CREATININE 0.8 01/31/2013 1253   CREATININE 0.73 02/10/2011 1529   CALCIUM 10.0 03/31/2013 1057   CALCIUM 9.8 01/31/2013 1253   PROT 7.0 01/31/2013 1253   PROT 6.9 01/17/2013 0112   ALBUMIN 3.9 01/31/2013 1253   ALBUMIN 4.4 01/17/2013 0112   AST 14 01/31/2013 1253   AST 18 01/17/2013 0112   ALT 12 01/31/2013 1253   ALT 14 01/17/2013 0112   ALKPHOS 69 01/31/2013 1253   ALKPHOS 63 01/17/2013 0112   BILITOT 0.68 01/31/2013 1253   BILITOT 0.8 01/17/2013 0112        Review of Systemssee hpi Current Outpatient Prescriptions  Medication Sig Dispense Refill  . aspirin 81 MG tablet Take 81 mg by mouth daily.      Marland Kitchen buPROPion (WELLBUTRIN SR) 150 MG 12 hr tablet Take 150 mg by mouth daily.      . calcium carbonate (OS-CAL) 600 MG TABS tablet Take 600 mg  by mouth 2 (two) times daily with a meal.      . Cholecalciferol (VITAMIN D-3 PO) Take 2,000 Int'l Units by mouth daily.      . clonazePAM (KLONOPIN) 0.5 MG tablet Take 0.5 mg by mouth 2 (two) times daily as needed for anxiety.      . fish oil-omega-3 fatty acids 1000 MG capsule Take 1 g by mouth daily.      Marland Kitchen lisinopril (PRINIVIL,ZESTRIL) 10 MG tablet Take 10 mg by mouth daily.      . Misc Natural Products (OSTEO BI-FLEX ADV TRIPLE ST PO) Take 2 tablets by mouth daily.      . polyethylene glycol (MIRALAX / GLYCOLAX) packet Take 17 g by mouth daily as needed.       No current facility-administered medications for this visit.   Past Medical History  Diagnosis Date  . Depression with anxiety 03/01/2012  . Hyperlipidemia 03/01/2012  . Hypertension   . Cancer 1994    BREAST CANCER, TREATED WITH RADIATION AND CHEMOTHERAPY  . Anxiety    Past Surgical History  Procedure Laterality Date  . Mastectomy      left breast  for cancer in 1994  . Breast surgery  1994    LEFT MASTECTOMY   . Cesarean section    . Cataract extraction Left    Allergies  Allergen Reactions  . Amoxicillin   . Codeine         Objective:   Physical Exam  Filed Vitals:   04/23/13 1047  BP: 122/60  Pulse: 76  Temp: 98.5 F (36.9 C)  Height: 5' 5.5" (1.664 m)  Weight: 166 lb 11.2 oz (75.615 kg)   Alert and oriented. Skin warm and dry. Oral mucosa is moist.   . Sclera anicteric, conjunctivae is pink. Thyroid not enlarged. No cervical lymphadenopathy. Lungs clear. Heart regular rate and rhythm.  Abdomen is soft. Bowel sounds are positive. No hepatomegaly. No abdominal masses felt. No tenderness.  No edema to lower extremities.  .     Assessment:    Fatty liver. Liver enzymes are normal. She is trying to exercise. She seems to be doing well.     Plan:     OV in one year. Continue to exercise. We will continue to monitor her.

## 2013-05-01 ENCOUNTER — Other Ambulatory Visit: Payer: Self-pay

## 2013-05-29 ENCOUNTER — Ambulatory Visit (INDEPENDENT_AMBULATORY_CARE_PROVIDER_SITE_OTHER): Payer: Medicare Other | Admitting: Otolaryngology

## 2013-05-29 DIAGNOSIS — H6120 Impacted cerumen, unspecified ear: Secondary | ICD-10-CM

## 2013-07-18 ENCOUNTER — Other Ambulatory Visit: Payer: Self-pay | Admitting: *Deleted

## 2013-08-23 ENCOUNTER — Encounter: Payer: Self-pay | Admitting: Oncology

## 2013-08-23 ENCOUNTER — Telehealth: Payer: Self-pay | Admitting: Oncology

## 2013-08-23 NOTE — Telephone Encounter (Signed)
, °

## 2013-08-27 ENCOUNTER — Other Ambulatory Visit: Payer: Self-pay | Admitting: Physician Assistant

## 2013-09-03 ENCOUNTER — Telehealth: Payer: Self-pay | Admitting: Oncology

## 2013-09-03 NOTE — Telephone Encounter (Signed)
received meessage from switchbaoard that pt is was trying to return my call but not able to lm. I did not call pt but did return call based on message received from switchboard. . not able to reach pt or lm - line busy.

## 2013-09-05 ENCOUNTER — Other Ambulatory Visit: Payer: Self-pay | Admitting: Oncology

## 2013-09-05 DIAGNOSIS — C50919 Malignant neoplasm of unspecified site of unspecified female breast: Secondary | ICD-10-CM

## 2013-09-08 ENCOUNTER — Telehealth: Payer: Self-pay | Admitting: Oncology

## 2013-09-08 NOTE — Telephone Encounter (Signed)
no answer and cell no vm available...mailed pt appt radioligy for to make aware of mammo @ [facility] on 7.28.15 @ 10:40am

## 2013-09-08 NOTE — Telephone Encounter (Signed)
CALLED PATIENT NOT ABLE TO LEAVE WILL TRY BACK WILL SEND APPT CALENDAR

## 2013-09-19 ENCOUNTER — Telehealth: Payer: Self-pay | Admitting: Hematology and Oncology

## 2013-09-19 NOTE — Telephone Encounter (Signed)
LEFT MESSAGE FOR PATIENT TO RETURN CALL  °

## 2013-09-30 ENCOUNTER — Encounter: Payer: Self-pay | Admitting: Oncology

## 2013-11-12 ENCOUNTER — Other Ambulatory Visit (HOSPITAL_COMMUNITY): Payer: Self-pay | Admitting: Pulmonary Disease

## 2013-11-12 ENCOUNTER — Ambulatory Visit (HOSPITAL_COMMUNITY)
Admission: RE | Admit: 2013-11-12 | Discharge: 2013-11-12 | Disposition: A | Discharge status: Home / Self Care | Payer: Medicare Other | Source: Ambulatory Visit | Attending: Pulmonary Disease | Admitting: Pulmonary Disease

## 2013-11-12 DIAGNOSIS — M25552 Pain in left hip: Secondary | ICD-10-CM

## 2013-11-12 DIAGNOSIS — M25551 Pain in right hip: Secondary | ICD-10-CM

## 2013-11-12 DIAGNOSIS — M25562 Pain in left knee: Secondary | ICD-10-CM

## 2013-11-12 DIAGNOSIS — IMO0002 Reserved for concepts with insufficient information to code with codable children: Secondary | ICD-10-CM | POA: Insufficient documentation

## 2013-11-12 DIAGNOSIS — I10 Essential (primary) hypertension: Secondary | ICD-10-CM | POA: Insufficient documentation

## 2013-11-12 DIAGNOSIS — M545 Low back pain, unspecified: Secondary | ICD-10-CM

## 2013-11-12 DIAGNOSIS — M47817 Spondylosis without myelopathy or radiculopathy, lumbosacral region: Secondary | ICD-10-CM | POA: Insufficient documentation

## 2013-11-12 DIAGNOSIS — M25559 Pain in unspecified hip: Secondary | ICD-10-CM | POA: Insufficient documentation

## 2013-11-12 DIAGNOSIS — M25561 Pain in right knee: Secondary | ICD-10-CM

## 2013-11-12 DIAGNOSIS — M1710 Unilateral primary osteoarthritis, unspecified knee: Secondary | ICD-10-CM | POA: Insufficient documentation

## 2013-11-12 DIAGNOSIS — M51379 Other intervertebral disc degeneration, lumbosacral region without mention of lumbar back pain or lower extremity pain: Secondary | ICD-10-CM | POA: Insufficient documentation

## 2013-11-12 DIAGNOSIS — M5137 Other intervertebral disc degeneration, lumbosacral region: Secondary | ICD-10-CM | POA: Insufficient documentation

## 2013-11-12 DIAGNOSIS — Z853 Personal history of malignant neoplasm of breast: Secondary | ICD-10-CM | POA: Insufficient documentation

## 2013-11-27 ENCOUNTER — Other Ambulatory Visit (HOSPITAL_COMMUNITY): Payer: Self-pay | Admitting: Pulmonary Disease

## 2013-11-27 DIAGNOSIS — C50919 Malignant neoplasm of unspecified site of unspecified female breast: Secondary | ICD-10-CM

## 2013-12-01 ENCOUNTER — Encounter (HOSPITAL_COMMUNITY): Payer: Self-pay

## 2013-12-01 ENCOUNTER — Ambulatory Visit (HOSPITAL_COMMUNITY)
Admission: RE | Admit: 2013-12-01 | Discharge: 2013-12-01 | Disposition: A | Discharge status: Home / Self Care | Payer: Medicare Other | Source: Ambulatory Visit | Attending: Pulmonary Disease | Admitting: Pulmonary Disease

## 2013-12-01 ENCOUNTER — Encounter (HOSPITAL_COMMUNITY)
Admission: RE | Admit: 2013-12-01 | Discharge: 2013-12-01 | Disposition: A | Discharge status: Home / Self Care | Payer: Medicare Other | Source: Ambulatory Visit | Attending: Pulmonary Disease | Admitting: Pulmonary Disease

## 2013-12-01 DIAGNOSIS — C50919 Malignant neoplasm of unspecified site of unspecified female breast: Secondary | ICD-10-CM | POA: Insufficient documentation

## 2013-12-01 DIAGNOSIS — M259 Joint disorder, unspecified: Secondary | ICD-10-CM | POA: Insufficient documentation

## 2013-12-01 DIAGNOSIS — M25569 Pain in unspecified knee: Secondary | ICD-10-CM | POA: Insufficient documentation

## 2013-12-01 DIAGNOSIS — M25559 Pain in unspecified hip: Secondary | ICD-10-CM | POA: Insufficient documentation

## 2013-12-01 MED ORDER — TECHNETIUM TC 99M MEDRONATE IV KIT
25.0000 | PACK | Freq: Once | INTRAVENOUS | Status: AC | PRN
  Administered 2013-12-01: 25 via INTRAVENOUS

## 2013-12-02 ENCOUNTER — Telehealth: Payer: Self-pay | Admitting: Hematology and Oncology

## 2013-12-02 NOTE — Telephone Encounter (Signed)
, °

## 2013-12-31 ENCOUNTER — Encounter (INDEPENDENT_AMBULATORY_CARE_PROVIDER_SITE_OTHER): Payer: Self-pay | Admitting: *Deleted

## 2014-01-24 HISTORY — PX: CAPSULOTOMY: SHX379

## 2014-01-28 ENCOUNTER — Ambulatory Visit: Payer: Medicare Other | Admitting: Hematology and Oncology

## 2014-01-28 ENCOUNTER — Other Ambulatory Visit: Payer: Medicare Other

## 2014-01-28 ENCOUNTER — Ambulatory Visit (INDEPENDENT_AMBULATORY_CARE_PROVIDER_SITE_OTHER): Payer: Medicare Other | Admitting: Gynecology

## 2014-01-28 ENCOUNTER — Encounter: Payer: Self-pay | Admitting: Gynecology

## 2014-01-28 VITALS — BP 130/84 | Ht 64.0 in | Wt 185.0 lb

## 2014-01-28 DIAGNOSIS — N952 Postmenopausal atrophic vaginitis: Secondary | ICD-10-CM

## 2014-01-28 DIAGNOSIS — M899 Disorder of bone, unspecified: Secondary | ICD-10-CM

## 2014-01-28 DIAGNOSIS — M8580 Other specified disorders of bone density and structure, unspecified site: Secondary | ICD-10-CM

## 2014-01-28 DIAGNOSIS — M949 Disorder of cartilage, unspecified: Secondary | ICD-10-CM

## 2014-01-28 DIAGNOSIS — Z853 Personal history of malignant neoplasm of breast: Secondary | ICD-10-CM

## 2014-01-28 NOTE — Progress Notes (Signed)
Tammy Terry 1941-02-15 [id number]   History:    72 y.o.  for GYN followup exam. Patient had a bone density study in 2014. Which had demonstrated that her lowest T. Score was at the left femoral neck with a value of -1.8. Her 10 year fracture risk of the hip is 3.8% exceeding 3.0% threshold. Patient's bone mass is 25% below normal. The patient has a 7 times greater risk of hip fracture and 5 time greater risk of spine fracture when compared to a younger population.Prior bone density study done in a different facility with a different machine so comparison not as accurate. Based on patient's age of 75 and her past history of osteoporosis she was started on Reclast IV October of 2014.Patient withhistory of stage III invasive lobular carcinoma of the left breast diagnosed in April 1994, 5-cm primary, ER/PR positive, one node positive, positive nipple involvement. Positive lymphatic invasion. She was treated with left mastectomy, eight cycles of CMF chemotherapy, chest wall radiation, and 5 years of tamoxifen. She has been followed by Dr. Beryle Beams her oncologist. Early this year patient had a right breast biopsy the pathology report was fibroadenoma. Patient has been 20 years without breast cancer recurrence.   Patient would no previous history of abnormal Pap smears.Her primary physician is Dr. Sinda Du who has been doing her lab work in treating her for hypercholesterolemia. Patient's last colonoscopy was in 2005. Patient had a stereotactic biopsy in 2014 and demonstrated a fibroadenoma. Patient had a mammogram this July for which we do not have records.    Past medical history,surgical history, family history and social history were all reviewed and documented in the EPIC chart.  Gynecologic History No LMP recorded. Patient is postmenopausal. Contraception: post menopausal status Last Pap: 2010. Results were: normal Last mammogram: See above. Results were: See above  Obstetric  History OB History  Gravida Para Term Preterm AB SAB TAB Ectopic Multiple Living  1 1        1     # Outcome Date GA Lbr Len/2nd Weight Sex Delivery Anes PTL Lv  1 PAR                ROS: A ROS was performed and pertinent positives and negatives are included in the history.  GENERAL: No fevers or chills. HEENT: No change in vision, no earache, sore throat or sinus congestion. NECK: No pain or stiffness. CARDIOVASCULAR: No chest pain or pressure. No palpitations. PULMONARY: No shortness of breath, cough or wheeze. GASTROINTESTINAL: No abdominal pain, nausea, vomiting or diarrhea, melena or bright red blood per rectum. GENITOURINARY: No urinary frequency, urgency, hesitancy or dysuria. MUSCULOSKELETAL: No joint or muscle pain, no back pain, no recent trauma. DERMATOLOGIC: No rash, no itching, no lesions. ENDOCRINE: No polyuria, polydipsia, no heat or cold intolerance. No recent change in weight. HEMATOLOGICAL: No anemia or easy bruising or bleeding. NEUROLOGIC: No headache, seizures, numbness, tingling or weakness. PSYCHIATRIC: No depression, no loss of interest in normal activity or change in sleep pattern.     Exam: chaperone present  BP 130/84  Ht 5\' 4"  (1.626 m)  Wt 185 lb (83.915 kg)  BMI 31.74 kg/m2  Body mass index is 31.74 kg/(m^2).  General appearance : Well developed well nourished female. No acute distress HEENT: Neck supple, trachea midline, no carotid bruits, no thyroidmegaly Lungs: Clear to auscultation, no rhonchi or wheezes, or rib retractions  Heart: Regular rate and rhythm, no murmurs or gallops Breast:Examined in sitting and supine position  were symmetrical in appearance, no palpable masses or tenderness,  no skin retraction, no nipple inversion, no nipple discharge, no skin discoloration, no axillary or supraclavicular lymphadenopathy Abdomen: no palpable masses or tenderness, no rebound or guarding Extremities: no edema or skin discoloration or  tenderness  Pelvic:  Bartholin, Urethra, Skene Glands: Within normal limits             Vagina: No gross lesions or discharge, atrophic changes  Cervix: No gross lesions or discharge  Uterus  axial, normal size, shape and consistency, non-tender and mobile  Adnexa  Without masses or tenderness  Anus and perineum  normal   Rectovaginal  normal sphincter tone without palpated masses or tenderness             Hemoccult PCP provides     Assessment/Plan:  73 y.o. female with a personal past history of breast cancer and osteopenia high-risk for fracture was started on intravenous Reclast in 2014 due for her next dose October of this year. She will have a bone density study this September to monitor response to treatment. No Pap smear longer required. We have requested release of her past mammogram report. She is due for a colonoscopy later this year. We discussed the importance of calcium and vitamin D daily.  Note: This dictation was prepared with  Dragon/digital dictation along withSmart phrase technology. Any transcriptional errors that result from this process are unintentional.   Terrance Mass MD, 12:36 PM 01/28/2014

## 2014-02-02 ENCOUNTER — Other Ambulatory Visit: Payer: Medicare Other

## 2014-02-02 ENCOUNTER — Ambulatory Visit: Payer: Medicare Other | Admitting: Oncology

## 2014-02-05 ENCOUNTER — Encounter: Payer: Self-pay | Admitting: Oncology

## 2014-02-12 ENCOUNTER — Other Ambulatory Visit (INDEPENDENT_AMBULATORY_CARE_PROVIDER_SITE_OTHER): Payer: Self-pay | Admitting: *Deleted

## 2014-02-12 ENCOUNTER — Telehealth: Payer: Self-pay

## 2014-02-12 DIAGNOSIS — Z1211 Encounter for screening for malignant neoplasm of colon: Secondary | ICD-10-CM

## 2014-02-12 NOTE — Telephone Encounter (Signed)
Patient had L breast cancer 20 years ago and has chosen to continue to see her oncologist every year since. Now he has retired and she really does not need oncologist anymore.  She wants you to order her R diagnostic mammogram that she has annually.  They always schedule it a year in advance and she has spoken with her insurance person and can have the next one on January 27, 2015 and she wants a 9 or 9:30am appt. She goes to [facility] and they are associated with [facility] in [place], [place].  Ok to order and schedule this for her.

## 2014-02-12 NOTE — Telephone Encounter (Signed)
Yes, thanks

## 2014-02-17 NOTE — Telephone Encounter (Signed)
I called [facility] to schedule appointment, but once speaking to scheduler I was informed that it is too far to schedule out. I called and explained to pt this as well, [facility] will send pt a letter to remind.

## 2014-02-18 ENCOUNTER — Ambulatory Visit: Payer: Medicare Other | Admitting: Hematology and Oncology

## 2014-02-18 ENCOUNTER — Other Ambulatory Visit: Payer: Medicare Other

## 2014-02-19 ENCOUNTER — Ambulatory Visit: Payer: Medicare Other | Admitting: Oncology

## 2014-02-19 ENCOUNTER — Other Ambulatory Visit: Payer: Medicare Other

## 2014-02-20 ENCOUNTER — Other Ambulatory Visit: Payer: Self-pay | Admitting: *Deleted

## 2014-02-20 DIAGNOSIS — C50919 Malignant neoplasm of unspecified site of unspecified female breast: Secondary | ICD-10-CM

## 2014-02-23 ENCOUNTER — Encounter: Payer: Self-pay | Admitting: Hematology and Oncology

## 2014-02-23 ENCOUNTER — Other Ambulatory Visit (HOSPITAL_BASED_OUTPATIENT_CLINIC_OR_DEPARTMENT_OTHER): Payer: Medicare Other

## 2014-02-23 ENCOUNTER — Ambulatory Visit (HOSPITAL_BASED_OUTPATIENT_CLINIC_OR_DEPARTMENT_OTHER): Payer: Medicare Other | Admitting: Hematology and Oncology

## 2014-02-23 VITALS — BP 164/68 | HR 91 | Temp 98.4°F | Resp 18 | Ht 64.0 in | Wt 187.4 lb

## 2014-02-23 DIAGNOSIS — Z853 Personal history of malignant neoplasm of breast: Secondary | ICD-10-CM

## 2014-02-23 DIAGNOSIS — C50919 Malignant neoplasm of unspecified site of unspecified female breast: Secondary | ICD-10-CM

## 2014-02-23 LAB — CBC WITH DIFFERENTIAL/PLATELET
BASO%: 0.5 % (ref 0.0–2.0)
Basophils Absolute: 0 10*3/uL (ref 0.0–0.1)
EOS ABS: 0.1 10*3/uL (ref 0.0–0.5)
EOS%: 1.9 % (ref 0.0–7.0)
HCT: 39.9 % (ref 34.8–46.6)
HGB: 12.8 g/dL (ref 11.6–15.9)
LYMPH#: 1.4 10*3/uL (ref 0.9–3.3)
LYMPH%: 24.9 % (ref 14.0–49.7)
MCH: 29.8 pg (ref 25.1–34.0)
MCHC: 32.1 g/dL (ref 31.5–36.0)
MCV: 93 fL (ref 79.5–101.0)
MONO#: 0.5 10*3/uL (ref 0.1–0.9)
MONO%: 8.8 % (ref 0.0–14.0)
NEUT%: 63.9 % (ref 38.4–76.8)
NEUTROS ABS: 3.7 10*3/uL (ref 1.5–6.5)
PLATELETS: 145 10*3/uL (ref 145–400)
RBC: 4.29 10*6/uL (ref 3.70–5.45)
RDW: 13.9 % (ref 11.2–14.5)
WBC: 5.8 10*3/uL (ref 3.9–10.3)

## 2014-02-23 LAB — COMPREHENSIVE METABOLIC PANEL (CC13)
ALK PHOS: 79 U/L (ref 40–150)
ALT: 31 U/L (ref 0–55)
AST: 21 U/L (ref 5–34)
Albumin: 4.1 g/dL (ref 3.5–5.0)
Anion Gap: 8 mEq/L (ref 3–11)
BUN: 24.2 mg/dL (ref 7.0–26.0)
CHLORIDE: 107 meq/L (ref 98–109)
CO2: 28 mEq/L (ref 22–29)
Calcium: 10.2 mg/dL (ref 8.4–10.4)
Creatinine: 0.8 mg/dL (ref 0.6–1.1)
Glucose: 99 mg/dl (ref 70–140)
Potassium: 4.1 mEq/L (ref 3.5–5.1)
Sodium: 144 mEq/L (ref 136–145)
Total Bilirubin: 0.43 mg/dL (ref 0.20–1.20)
Total Protein: 7.1 g/dL (ref 6.4–8.3)

## 2014-02-23 NOTE — Assessment & Plan Note (Signed)
Invasive lobular cancer of the left breast 5 cm primary T2, N1, M0 stage III status post mastectomy and radiation CMF x8 followed by tamoxifen x5 years. Initial diagnosis was in 1994. Patient was seen Dr. Beryle Beams and since he retired, she has come here today to establish oncology care. Patient has no problems or concerns. She gets mammograms done through GYN. She wishes to follow with OB/GYN for her followups and with her PCP. If she had any problems or lumps or nodules her up there were any additional biopsies done, she would come back to see [place].  Return to clinic as needed

## 2014-02-23 NOTE — Progress Notes (Signed)
Patient Care Team: Alonza Bogus, MD as PCP - General (Internal Medicine)  DIAGNOSIS: No matching staging information was found for the patient.  SUMMARY OF ONCOLOGIC HISTORY:   Breast cancer, stage 3   09/29/1992 Initial Diagnosis Breast cancer, stage 3; 5 cm primary, one lymph node positive, positive nipple involvement and lymphatic invasion, left mastectomy, CMF x8 cycles, chest wall radiation, 5 years tamoxifen   01/22/2013 Procedure Biopsy of calcifications, benign   01/22/2013 Mammogram 2 clusters of calcifications    CHIEF COMPLIANT: Breast cancer followup  INTERVAL HISTORY: Mrs. Glahn is a 73 year old Caucasian lady with above-mentioned history of breast cancer diagnosed 20 years ago and was treated with surgery chemotherapy radiation therapy and adjuvant hormonal therapy for 5 years of tamoxifen. She was followed by Dr. Beryle Beams but since he retired she is been set up to see [place] for followup. She had a recent mammogram which was normal except for benign calcifications. Prior to this mammogram she had 2 mammograms in 6 months apart for abnormal calcifications one of which was in biopsied in July 2014 which came back as benign. She has no new complaints or concerns.   REVIEW OF SYSTEMS:   Constitutional: Denies fevers, chills or abnormal weight loss Eyes: Denies blurriness of vision Ears, nose, mouth, throat, and face: Denies mucositis or sore throat Respiratory: Denies cough, dyspnea or wheezes Cardiovascular: Denies palpitation, chest discomfort or lower extremity swelling Gastrointestinal:  Denies nausea, heartburn or change in bowel habits Skin: Denies abnormal skin rashes Lymphatics: Denies new lymphadenopathy or easy bruising Neurological:Denies numbness, tingling or new weaknesses Behavioral/Psych: Mood is stable, no new changes  Breast:  denies any pain or lumps or nodules in either breasts All other systems were reviewed with the patient and are negative.  I have  reviewed the past medical history, past surgical history, social history and family history with the patient and they are unchanged from previous note.  ALLERGIES:  is allergic to amoxicillin and codeine.  MEDICATIONS:  Current Outpatient Prescriptions  Medication Sig Dispense Refill  . acetaminophen (TYLENOL) 325 MG tablet Take 650 mg by mouth every 4 (four) hours as needed.      Marland Kitchen aspirin 81 MG tablet Take 81 mg by mouth daily.      . calcium carbonate (OS-CAL) 600 MG TABS tablet Take 600 mg by mouth 2 (two) times daily with a meal.      . Cholecalciferol (VITAMIN D-3 PO) Take 2,000 Int'l Units by mouth daily.      . clonazePAM (KLONOPIN) 0.5 MG tablet Take 0.5 mg by mouth 2 (two) times daily as needed for anxiety.      Marland Kitchen ibuprofen (ADVIL,MOTRIN) 200 MG tablet Take 200 mg by mouth every 4 (four) hours as needed.      Marland Kitchen losartan (COZAAR) 50 MG tablet Take 50 mg by mouth daily.      . Misc Natural Products (OSTEO BI-FLEX ADV TRIPLE ST PO) Take 2 tablets by mouth daily.      . polyethylene glycol (MIRALAX / GLYCOLAX) packet Take 17 g by mouth daily as needed.      . sertraline (ZOLOFT) 100 MG tablet Take 100 mg by mouth daily.       No current facility-administered medications for this visit.    PHYSICAL EXAMINATION: ECOG PERFORMANCE STATUS: 0 - Asymptomatic  Filed Vitals:   02/23/14 1433  BP: 164/68  Pulse: 91  Temp: 98.4 F (36.9 C)  Resp: 18   Filed Weights   02/23/14 1433  Weight: 187 lb 6.4 oz (85.004 kg)    GENERAL:alert, no distress and comfortable SKIN: skin color, texture, turgor are normal, no rashes or significant lesions EYES: normal, Conjunctiva are pink and non-injected, sclera clear OROPHARYNX:no exudate, no erythema and lips, buccal mucosa, and tongue normal  NECK: supple, thyroid normal size, non-tender, without nodularity LYMPH:  no palpable lymphadenopathy in the cervical, axillary or inguinal LUNGS: clear to auscultation and percussion with normal  breathing effort HEART: regular rate & rhythm and no murmurs and no lower extremity edema ABDOMEN:abdomen soft, non-tender and normal bowel sounds Musculoskeletal:no cyanosis of digits and no clubbing  NEURO: alert & oriented x 3 with fluent speech, no focal motor/sensory deficits    LABORATORY DATA:  I have reviewed the data as listed   Chemistry      Component Value Date/Time   NA 144 02/23/2014 1418   NA 142 01/17/2013 0112   K 4.1 02/23/2014 1418   K 4.9 01/17/2013 0112   CL 104 01/17/2013 0112   CL 105 03/01/2012 1009   CO2 28 02/23/2014 1418   CO2 30 01/17/2013 0112   BUN 24.2 02/23/2014 1418   BUN 18 01/17/2013 0112   CREATININE 0.8 02/23/2014 1418   CREATININE 0.66 03/31/2013 1057   CREATININE 0.73 02/10/2011 1529      Component Value Date/Time   CALCIUM 10.2 02/23/2014 1418   CALCIUM 10.0 03/31/2013 1057   ALKPHOS 79 02/23/2014 1418   ALKPHOS 63 01/17/2013 0112   AST 21 02/23/2014 1418   AST 18 01/17/2013 0112   ALT 31 02/23/2014 1418   ALT 14 01/17/2013 0112   BILITOT 0.43 02/23/2014 1418   BILITOT 0.8 01/17/2013 0112       Lab Results  Component Value Date   WBC 5.8 02/23/2014   HGB 12.8 02/23/2014   HCT 39.9 02/23/2014   MCV 93.0 02/23/2014   PLT 145 02/23/2014   NEUTROABS 3.7 02/23/2014     RADIOGRAPHIC STUDIES: I have personally reviewed the radiology reports and agreed with their findings. No results found.   ASSESSMENT & PLAN:  Breast cancer, stage 3 Invasive lobular cancer of the left breast 5 cm primary T2, N1, M0 stage III status post mastectomy and radiation CMF x8 followed by tamoxifen x5 years. Initial diagnosis was in 1994. Patient was seen Dr. Beryle Beams and since he retired, she has come here today to establish oncology care. Patient has no problems or concerns. She gets mammograms done through GYN. She wishes to follow with OB/GYN for her followups and with her PCP. If she had any problems or lumps or nodules her up there were any additional biopsies done, she  would come back to see [place].  Return to clinic as needed    No orders of the defined types were placed in this encounter.   The patient has a good understanding of the overall plan. she agrees with it. She will call with any problems that may develop before her next visit here.  I spent 20 minutes counseling the patient face to face. The total time spent in the appointment was 25 minutes and more than 50% was on counseling and review of test results    Rulon Eisenmenger, MD 02/23/2014 3:17 PM

## 2014-03-10 ENCOUNTER — Ambulatory Visit (INDEPENDENT_AMBULATORY_CARE_PROVIDER_SITE_OTHER): Payer: Medicare Other

## 2014-03-10 DIAGNOSIS — M949 Disorder of cartilage, unspecified: Secondary | ICD-10-CM

## 2014-03-10 DIAGNOSIS — M899 Disorder of bone, unspecified: Secondary | ICD-10-CM

## 2014-03-10 DIAGNOSIS — M8580 Other specified disorders of bone density and structure, unspecified site: Secondary | ICD-10-CM

## 2014-03-13 ENCOUNTER — Telehealth: Payer: Self-pay | Admitting: *Deleted

## 2014-03-13 NOTE — Telephone Encounter (Signed)
Pt called requesting dexa results on 03/10/14, I told pt results are not back yet

## 2014-03-20 ENCOUNTER — Telehealth: Payer: Self-pay | Admitting: *Deleted

## 2014-03-20 NOTE — Telephone Encounter (Signed)
Pt called questions regarding dexa results, all question answered

## 2014-03-27 ENCOUNTER — Telehealth (INDEPENDENT_AMBULATORY_CARE_PROVIDER_SITE_OTHER): Payer: Self-pay | Admitting: *Deleted

## 2014-03-27 DIAGNOSIS — Z1211 Encounter for screening for malignant neoplasm of colon: Secondary | ICD-10-CM

## 2014-03-27 NOTE — Telephone Encounter (Signed)
Patient needs movi prep 

## 2014-03-30 MED ORDER — PEG-KCL-NACL-NASULF-NA ASC-C 100 G PO SOLR: 1.0000 | Freq: Once | ORAL | Status: DC

## 2014-04-15 ENCOUNTER — Telehealth (INDEPENDENT_AMBULATORY_CARE_PROVIDER_SITE_OTHER): Payer: Self-pay | Admitting: *Deleted

## 2014-04-15 NOTE — Telephone Encounter (Signed)
Referring MD/PCP: hawkins   Procedure: tcs  Reason/Indication:  screening  Has patient had this procedure before?  Yes, 2005 -- epic  If so, when, by whom and where?    Is there a family history of colon cancer?  no  Who?  What age when diagnosed?    Is patient diabetic?   no      Does patient have prosthetic heart valve?  no  Do you have a pacemaker?  no  Has patient ever had endocarditis? no  Has patient had joint replacement within last 12 months?  no  Does patient tend to be constipated or take laxatives? no  Is patient on Coumadin, Plavix and/or Aspirin? no  Medications: sertraline 100 mg 1/2 tab in am, clonazepam 0.5 mg 1/2 tab every 4 hours prn, losartan 50 mg daily, calcium 600 mg bid, osteo bioflex bid, vit d3 1000 iu daily, tylenol prn  Allergies: see epic  Medication Adjustment:   Procedure date & time: 05/13/14 at 930

## 2014-04-17 NOTE — Telephone Encounter (Signed)
agree

## 2014-04-27 ENCOUNTER — Encounter: Payer: Self-pay | Admitting: Hematology and Oncology

## 2014-04-28 ENCOUNTER — Ambulatory Visit (INDEPENDENT_AMBULATORY_CARE_PROVIDER_SITE_OTHER): Payer: Medicare Other | Admitting: Internal Medicine

## 2014-05-13 ENCOUNTER — Encounter (HOSPITAL_COMMUNITY)
Admission: RE | Disposition: A | Discharge status: Home / Self Care | Payer: Self-pay | Source: Ambulatory Visit | Attending: Internal Medicine

## 2014-05-13 ENCOUNTER — Encounter (HOSPITAL_COMMUNITY): Payer: Self-pay | Admitting: *Deleted

## 2014-05-13 ENCOUNTER — Ambulatory Visit (HOSPITAL_COMMUNITY)
Admission: RE | Admit: 2014-05-13 | Discharge: 2014-05-13 | Disposition: A | Discharge status: Home / Self Care | Payer: Medicare Other | Source: Ambulatory Visit | Attending: Internal Medicine | Admitting: Internal Medicine

## 2014-05-13 DIAGNOSIS — I10 Essential (primary) hypertension: Secondary | ICD-10-CM | POA: Insufficient documentation

## 2014-05-13 DIAGNOSIS — Z886 Allergy status to analgesic agent status: Secondary | ICD-10-CM | POA: Insufficient documentation

## 2014-05-13 DIAGNOSIS — Z1211 Encounter for screening for malignant neoplasm of colon: Secondary | ICD-10-CM | POA: Insufficient documentation

## 2014-05-13 DIAGNOSIS — D120 Benign neoplasm of cecum: Secondary | ICD-10-CM | POA: Insufficient documentation

## 2014-05-13 DIAGNOSIS — K644 Residual hemorrhoidal skin tags: Secondary | ICD-10-CM | POA: Diagnosis not present

## 2014-05-13 DIAGNOSIS — Z881 Allergy status to other antibiotic agents status: Secondary | ICD-10-CM | POA: Insufficient documentation

## 2014-05-13 DIAGNOSIS — E785 Hyperlipidemia, unspecified: Secondary | ICD-10-CM | POA: Insufficient documentation

## 2014-05-13 DIAGNOSIS — F418 Other specified anxiety disorders: Secondary | ICD-10-CM | POA: Insufficient documentation

## 2014-05-13 DIAGNOSIS — K649 Unspecified hemorrhoids: Secondary | ICD-10-CM

## 2014-05-13 DIAGNOSIS — Z853 Personal history of malignant neoplasm of breast: Secondary | ICD-10-CM | POA: Diagnosis not present

## 2014-05-13 HISTORY — PX: COLONOSCOPY: SHX5424

## 2014-05-13 SURGERY — COLONOSCOPY
Anesthesia: Moderate Sedation

## 2014-05-13 MED ORDER — SODIUM CHLORIDE 0.9 % IV SOLN
INTRAVENOUS | Status: DC
  Administered 2014-05-13: 1000 mL via INTRAVENOUS

## 2014-05-13 MED ORDER — MEPERIDINE HCL 50 MG/ML IJ SOLN
INTRAMUSCULAR | Status: DC | PRN
  Administered 2014-05-13 (×2): 25 mg via INTRAVENOUS

## 2014-05-13 MED ORDER — MIDAZOLAM HCL 5 MG/5ML IJ SOLN
INTRAMUSCULAR | Status: DC | PRN
  Administered 2014-05-13 (×2): 1 mg via INTRAVENOUS
  Administered 2014-05-13: 2 mg via INTRAVENOUS
  Administered 2014-05-13: 1 mg via INTRAVENOUS

## 2014-05-13 MED ORDER — STERILE WATER FOR IRRIGATION IR SOLN
Status: DC | PRN
  Administered 2014-05-13: 10:00:00

## 2014-05-13 MED ORDER — MIDAZOLAM HCL 5 MG/5ML IJ SOLN
INTRAMUSCULAR | Status: AC
  Filled 2014-05-13: qty 10

## 2014-05-13 MED ORDER — MEPERIDINE HCL 50 MG/ML IJ SOLN
INTRAMUSCULAR | Status: AC
  Filled 2014-05-13: qty 1

## 2014-05-13 NOTE — Op Note (Signed)
COLONOSCOPY PROCEDURE REPORT  PATIENT:  Tammy Terry  MR#:  [id number] Birthdate:  1940-11-29, 73 y.o., female Endoscopist:  Dr. Rogene Houston, MD Referred By:  Dr. Alonza Bogus, MD Procedure Date: 05/13/2014  Procedure:   Colonoscopy  Indications:  Patient is 73 year old Caucasian female was undergoing average risk screening colonoscopy. Patient's last exam was about 10 years ago.  Informed Consent:  The procedure and risks were reviewed with the patient and informed consent was obtained.  Medications:  Demerol 50 mg IV Versed 5 mg IV  Description of procedure:  After a digital rectal exam was performed, that colonoscope was advanced from the anus through the rectum and colon to the area of the cecum, ileocecal valve and appendiceal orifice. The cecum was deeply intubated. These structures were well-seen and photographed for the record. From the level of the cecum and ileocecal valve, the scope was slowly and cautiously withdrawn. The mucosal surfaces were carefully surveyed utilizing scope tip to flexion to facilitate fold flattening as needed. The scope was pulled down into the rectum where a thorough exam including retroflexion was performed.  Findings:   Prep satisfactory. 6 x 4 mm sessile polyp hot snare from cecum. Mucosa rest of the colon and rectum was normal. Small hemorrhoids below the dentate line.   Therapeutic/Diagnostic Maneuvers Performed:  See above  Complications:  none  Cecal Withdrawal Time:  14 minutes  Impression:  Examination performed to cecum. 6 x 4 mm sessile cecal polyp hot snared. Small external hemorrhoids.  Recommendations:  Standard instructions given. I will contact patient with biopsy results and further recommendations.  REHMAN,NAJEEB U  05/13/2014 10:17 AM  CC: Dr. Alonza Bogus, MD & Dr. Rayne Du ref. provider found

## 2014-05-13 NOTE — Discharge Instructions (Signed)
No aspirin or NSAIDs for 1 week. Resume other medications and usual diet. No driving for 24 hours. Physician will call with biopsy results.  Colonoscopy, Care After Refer to this sheet in the next few weeks. These instructions provide you with information on caring for yourself after your procedure. Your health care provider may also give you more specific instructions. Your treatment has been planned according to current medical practices, but problems sometimes occur. Call your health care provider if you have any problems or questions after your procedure. WHAT TO EXPECT AFTER THE PROCEDURE  After your procedure, it is typical to have the following:  A small amount of blood in your stool.  Moderate amounts of gas and mild abdominal cramping or bloating. HOME CARE INSTRUCTIONS  Do not drive, operate machinery, or sign important documents for 24 hours.  You may shower and resume your regular physical activities, but move at a slower pace for the first 24 hours.  Take frequent rest periods for the first 24 hours.  Walk around or put a warm pack on your abdomen to help reduce abdominal cramping and bloating.  Drink enough fluids to keep your urine clear or pale yellow.  You may resume your normal diet as instructed by your health care provider. Avoid heavy or fried foods that are hard to digest.  Avoid drinking alcohol for 24 hours or as instructed by your health care provider.  Only take over-the-counter or prescription medicines as directed by your health care provider.  If a tissue sample (biopsy) was taken during your procedure:  Do not take aspirin or blood thinners for 7 days, or as instructed by your health care provider.  Do not drink alcohol for 7 days, or as instructed by your health care provider.  Eat soft foods for the first 24 hours. SEEK MEDICAL CARE IF: You have persistent spotting of blood in your stool 2-3 days after the procedure. SEEK IMMEDIATE MEDICAL CARE  IF:  You have more than a small spotting of blood in your stool.  You pass large blood clots in your stool.  Your abdomen is swollen (distended).  You have nausea or vomiting.  You have a fever.  You have increasing abdominal pain that is not relieved with medicine. Document Released: 01/25/2004 Document Revised: 04/02/2013 Document Reviewed: 02/17/2013 [facility] Patient Information 2015 [place], [place]. This information is not intended to replace advice given to you by your health care provider. Make sure you discuss any questions you have with your health care provider.

## 2014-05-13 NOTE — H&P (Signed)
Tammy Terry is an 73 y.o. female.   Chief Complaint:  Patient is here for colonoscopy. HPI:   Patient is 73 year old Caucasian female who is here for screening colonoscopy. She denies abdominal pain change in bowel habits or rectal bleeding. Last colonoscopy was 10 years ago.  Family history is negative for CRC.  Past Medical History  Diagnosis Date  . Depression with anxiety 03/01/2012  . Hyperlipidemia 03/01/2012  . Hypertension   . Cancer 1994    BREAST CANCER, TREATED WITH RADIATION AND CHEMOTHERAPY  . Anxiety     Past Surgical History  Procedure Laterality Date  . Mastectomy      left breast for cancer in 1994  . Breast surgery  1994    LEFT MASTECTOMY   . Cesarean section    . Cataract extraction Left   . Capsulotomy  august 2015    of left eye    Family History  Problem Relation Age of Onset  . Diabetes Paternal Grandmother    Social History:  reports that she has never smoked. She does not have any smokeless tobacco history on file. She reports that she does not drink alcohol or use illicit drugs.  Allergies:  Allergies  Allergen Reactions  . Amoxicillin   . Codeine     Medications Prior to Admission  Medication Sig Dispense Refill  . acetaminophen (TYLENOL) 325 MG tablet Take 650 mg by mouth every 4 (four) hours as needed for mild pain.     . calcium carbonate (OS-CAL) 600 MG TABS tablet Take 600 mg by mouth 2 (two) times daily with a meal.    . Cholecalciferol (VITAMIN D-3 PO) Take 2,000 Int'l Units by mouth daily.    . clonazePAM (KLONOPIN) 0.5 MG tablet Take 0.25 mg by mouth 4 (four) times daily as needed for anxiety.     Marland Kitchen ibuprofen (ADVIL,MOTRIN) 200 MG tablet Take 200 mg by mouth every 4 (four) hours as needed for moderate pain.     Marland Kitchen losartan (COZAAR) 50 MG tablet Take 50 mg by mouth daily.    . Misc Natural Products (OSTEO BI-FLEX ADV TRIPLE ST PO) Take 2 tablets by mouth daily.    . peg 3350 powder (MOVIPREP) 100 G SOLR Take 1 kit (200 g total) by  mouth once. 1 kit 0  . sertraline (ZOLOFT) 100 MG tablet Take 50 mg by mouth daily.       No results found for this or any previous visit (from the past 48 hour(s)). No results found.  ROS  Blood pressure 163/73, pulse 78, temperature 98.1 F (36.7 C), temperature source Oral, resp. rate 12, weight 184 lb (83.462 kg), SpO2 99 %. Physical Exam  Constitutional: She appears well-developed and well-nourished.  HENT:  Mouth/Throat: Oropharynx is clear and moist.  Eyes: Conjunctivae are normal. No scleral icterus.  Neck: No thyromegaly present.  Cardiovascular: Normal rate, regular rhythm and normal heart sounds.   No murmur heard. Respiratory: Effort normal and breath sounds normal.  GI:  Protuberant but soft abdomen without tenderness organomegaly or masses.  Musculoskeletal: She exhibits no edema.  Lymphadenopathy:    She has no cervical adenopathy.  Neurological: She is alert.  Skin: Skin is warm and dry.     Assessment/Plan Average risk screening colonoscopy.  Tammy Terry U 05/13/2014, 9:42 AM

## 2014-05-19 ENCOUNTER — Encounter (INDEPENDENT_AMBULATORY_CARE_PROVIDER_SITE_OTHER): Payer: Self-pay | Admitting: *Deleted

## 2014-05-19 ENCOUNTER — Encounter (HOSPITAL_COMMUNITY): Payer: Self-pay | Admitting: Internal Medicine

## 2014-07-02 ENCOUNTER — Ambulatory Visit (INDEPENDENT_AMBULATORY_CARE_PROVIDER_SITE_OTHER): Payer: Medicare Other | Admitting: Otolaryngology

## 2014-07-02 DIAGNOSIS — H608X3 Other otitis externa, bilateral: Secondary | ICD-10-CM

## 2014-07-02 DIAGNOSIS — H6122 Impacted cerumen, left ear: Secondary | ICD-10-CM

## 2014-12-07 ENCOUNTER — Emergency Department (HOSPITAL_COMMUNITY): Payer: Medicare Other

## 2014-12-07 ENCOUNTER — Encounter (HOSPITAL_COMMUNITY): Payer: Self-pay | Admitting: Emergency Medicine

## 2014-12-07 ENCOUNTER — Emergency Department (HOSPITAL_COMMUNITY)
Admission: EM | Admit: 2014-12-07 | Discharge: 2014-12-07 | Disposition: A | Discharge status: Home / Self Care | Payer: Medicare Other | Attending: Emergency Medicine | Admitting: Emergency Medicine

## 2014-12-07 DIAGNOSIS — Z79899 Other long term (current) drug therapy: Secondary | ICD-10-CM | POA: Diagnosis not present

## 2014-12-07 DIAGNOSIS — Z880 Allergy status to penicillin: Secondary | ICD-10-CM | POA: Diagnosis not present

## 2014-12-07 DIAGNOSIS — E785 Hyperlipidemia, unspecified: Secondary | ICD-10-CM | POA: Diagnosis not present

## 2014-12-07 DIAGNOSIS — F418 Other specified anxiety disorders: Secondary | ICD-10-CM | POA: Insufficient documentation

## 2014-12-07 DIAGNOSIS — K5900 Constipation, unspecified: Secondary | ICD-10-CM | POA: Insufficient documentation

## 2014-12-07 DIAGNOSIS — I10 Essential (primary) hypertension: Secondary | ICD-10-CM | POA: Insufficient documentation

## 2014-12-07 DIAGNOSIS — Z853 Personal history of malignant neoplasm of breast: Secondary | ICD-10-CM | POA: Diagnosis not present

## 2014-12-07 MED ORDER — FLEET ENEMA 7-19 GM/118ML RE ENEM
1.0000 | ENEMA | Freq: Once | RECTAL | Status: AC
  Administered 2014-12-07: 1 via RECTAL

## 2014-12-07 NOTE — Discharge Instructions (Signed)
Constipation Constipation is when a person:  Poops (has a bowel movement) less than 3 times a week.  Has a hard time pooping.  Has poop that is dry, hard, or bigger than normal. HOME CARE   Eat foods with a lot of fiber in them. This includes fruits, vegetables, beans, and whole grains such as brown rice.  Avoid fatty foods and foods with a lot of sugar. This includes french fries, hamburgers, cookies, candy, and soda.  If you are not getting enough fiber from food, take products with added fiber in them (supplements).  Drink enough fluid to keep your pee (urine) clear or pale yellow.  Exercise on a regular basis, or as told by your doctor.  Go to the restroom when you feel like you need to poop. Do not hold it.  Only take medicine as told by your doctor. Do not take medicines that help you poop (laxatives) without talking to your doctor first. GET HELP RIGHT AWAY IF:   You have bright red blood in your poop (stool).  Your constipation lasts more than 4 days or gets worse.  You have belly (abdominal) or butt (rectal) pain.  You have thin poop (as thin as a pencil).  You lose weight, and it cannot be explained. MAKE SURE YOU:   Understand these instructions.  Will watch your condition.  Will get help right away if you are not doing well or get worse. Document Released: 11/29/2007 Document Revised: 06/17/2013 Document Reviewed: 03/24/2013 [facility] Patient Information 2015 [place], [place]. This information is not intended to replace advice given to you by your health care provider. Make sure you discuss any questions you have with your health care provider.   Increase fluid, fruit, fibers. Other products include Miralax, Dulcolax, fleets enema

## 2014-12-07 NOTE — ED Provider Notes (Signed)
CSN: [id number]     Arrival date & time 12/07/14  1704 History   First MD Initiated Contact with Patient 12/07/14 1714     Chief Complaint  Patient presents with  . Constipation     (Consider location/radiation/quality/duration/timing/severity/associated sxs/prior Treatment) HPI .Marland Kitchen... Decreased bowel movement for several days. Patient does not normally have this problem. She has been eating without abdominal pain. No fever, chills. Status post cesarean section many years ago. No rectal bleeding  Past Medical History  Diagnosis Date  . Depression with anxiety 03/01/2012  . Hyperlipidemia 03/01/2012  . Hypertension   . Cancer 1994    BREAST CANCER, TREATED WITH RADIATION AND CHEMOTHERAPY  . Anxiety    Past Surgical History  Procedure Laterality Date  . Mastectomy      left breast for cancer in 1994  . Breast surgery  1994    LEFT MASTECTOMY   . Cesarean section    . Cataract extraction Left   . Capsulotomy  august 2015    of left eye  . Colonoscopy N/A 05/13/2014    Procedure: COLONOSCOPY;  Surgeon: Rogene Houston, MD;  Location: [facility];  Service: Endoscopy;  Laterality: N/A;  930   Family History  Problem Relation Age of Onset  . Diabetes Paternal Grandmother    History  Substance Use Topics  . Smoking status: Never Smoker   . Smokeless tobacco: Not on file  . Alcohol Use: No   OB History    Gravida Para Term Preterm AB TAB SAB Ectopic Multiple Living   1 1        1      Review of Systems  All other systems reviewed and are negative.     Allergies  Amoxicillin and Codeine  Home Medications   Prior to Admission medications   Medication Sig Start Date End Date Taking? Authorizing Provider  acetaminophen (TYLENOL) 325 MG tablet Take 650 mg by mouth every 4 (four) hours as needed for mild pain.     Historical Provider, MD  calcium carbonate (OS-CAL) 600 MG TABS tablet Take 600 mg by mouth 2 (two) times daily with a meal.    Historical Provider, MD   Cholecalciferol (VITAMIN D-3 PO) Take 2,000 Int'l Units by mouth daily.    Historical Provider, MD  clonazePAM (KLONOPIN) 0.5 MG tablet Take 0.25 mg by mouth 4 (four) times daily as needed for anxiety.     Historical Provider, MD  losartan (COZAAR) 50 MG tablet Take 50 mg by mouth daily.    Historical Provider, MD  Misc Natural Products (OSTEO BI-FLEX ADV TRIPLE ST PO) Take 2 tablets by mouth daily.    Historical Provider, MD  sertraline (ZOLOFT) 100 MG tablet Take 50 mg by mouth daily.     Historical Provider, MD   BP 172/85 mmHg  Pulse 78  Temp(Src) 98 F (36.7 C) (Oral)  Resp 18  Ht 5\' 5"  (1.651 m)  Wt 170 lb (77.111 kg)  BMI 28.29 kg/m2  SpO2 100% Physical Exam  Constitutional: She is oriented to person, place, and time. She appears well-developed and well-nourished.  HENT:  Head: Normocephalic and atraumatic.  Eyes: Conjunctivae and EOM are normal. Pupils are equal, round, and reactive to light.  Neck: Normal range of motion. Neck supple.  Cardiovascular: Normal rate and regular rhythm.   Pulmonary/Chest: Effort normal and breath sounds normal.  Abdominal: Soft. Bowel sounds are normal.  Genitourinary:  Rectal exam shows excessive firm stool in the vault  Musculoskeletal:  Normal range of motion.  Neurological: She is alert and oriented to person, place, and time.  Skin: Skin is warm and dry.  Psychiatric: She has a normal mood and affect. Her behavior is normal.  Nursing note and vitals reviewed.   ED Course  Fecal disimpaction Date/Time: 12/07/2014 9:23 PM Performed by: Nat Christen Authorized by: Nat Christen Comments: 2100:   Digital fecal impaction performed by examiner with good results. Large amount of stool removed manually. Fleets enema applied   (including critical care time) Labs Review Labs Reviewed - No data to display  Imaging Review Dg Abd Acute W/chest  12/07/2014   CLINICAL DATA:  Acute onset of severe rectal pain and multiple episodes of diarrhea.  Initial encounter.  EXAM: DG ABDOMEN ACUTE W/ 1V CHEST  COMPARISON:  Lumbar spine radiographs performed 11/12/2013  FINDINGS: The lungs are well-aerated. Peribronchial thickening is noted. Pulmonary vascularity is at the upper limits of normal. Mild bilateral atelectasis or scarring is seen. Postoperative change is noted overlying the left breast. There is no evidence of pleural effusion or pneumothorax. The cardiomediastinal silhouette is borderline enlarged.  The visualized bowel gas pattern is unremarkable. Scattered stool and air are seen within the colon; there is no evidence of small bowel dilatation to suggest obstruction. No free intra-abdominal air is identified on the provided upright view.  No acute osseous abnormalities are seen; the sacroiliac joints are unremarkable in appearance. Mild degenerative change is noted at the lower lumbar spine.  IMPRESSION: 1. Unremarkable bowel gas pattern; no free intra-abdominal air seen. Moderate amount of stool noted in the colon. 2. Peribronchial thickening noted. Mild bilateral atelectasis or scarring seen. Borderline cardiomegaly.   Electronically Signed   By: Garald Balding M.D.   On: 12/07/2014 18:19     EKG Interpretation None      MDM   Final diagnoses:  Constipation, unspecified constipation type    Patient had profound constipation. I performed a digital disimpaction. Good results were obtained.    Nat Christen, MD 12/07/14 2124

## 2014-12-07 NOTE — ED Notes (Signed)
Pt states that she went to the bathroom today and it was runny but felt like she was constipated and needed to keep going but nothing would come out.  States fell 2 weeks ago on tailbone and did not get evaluated.

## 2014-12-07 NOTE — ED Notes (Signed)
Assumed care of patient from [place], [place]. Pt lying on stretcher awaiting EDP re - eval - pt continues to report lower back pain (EDP aware) - blanket given - VSS - no distress noted at this time - husband at side.

## 2014-12-07 NOTE — ED Notes (Signed)
Pt ambulatory to bathroom with assist.   

## 2015-02-03 ENCOUNTER — Other Ambulatory Visit: Payer: Self-pay | Admitting: Gynecology

## 2015-02-03 ENCOUNTER — Ambulatory Visit (INDEPENDENT_AMBULATORY_CARE_PROVIDER_SITE_OTHER): Payer: Medicare Other | Admitting: Gynecology

## 2015-02-03 ENCOUNTER — Encounter: Payer: Self-pay | Admitting: Gynecology

## 2015-02-03 ENCOUNTER — Telehealth: Payer: Self-pay | Admitting: *Deleted

## 2015-02-03 VITALS — BP 140/82 | Ht 64.0 in | Wt 181.0 lb

## 2015-02-03 DIAGNOSIS — Z01419 Encounter for gynecological examination (general) (routine) without abnormal findings: Secondary | ICD-10-CM | POA: Diagnosis not present

## 2015-02-03 DIAGNOSIS — R140 Abdominal distension (gaseous): Secondary | ICD-10-CM

## 2015-02-03 DIAGNOSIS — M8580 Other specified disorders of bone density and structure, unspecified site: Secondary | ICD-10-CM | POA: Diagnosis not present

## 2015-02-03 DIAGNOSIS — Z853 Personal history of malignant neoplasm of breast: Secondary | ICD-10-CM | POA: Diagnosis not present

## 2015-02-03 NOTE — Telephone Encounter (Signed)
-----   Message from Terrance Mass, MD sent at 02/03/2015 10:31 AM EDT ----- Please schedule abdominal pelvic ultrasound  scan: Abdominal distention

## 2015-02-03 NOTE — Telephone Encounter (Signed)
Appointment 02/15/15 @ 8:00am at [facility] left message for pt to call. Npo after midnight.

## 2015-02-03 NOTE — Progress Notes (Signed)
Tammy Terry Nov 03, 1940 [id number]   History:    74 y.o.  for annual gyn exam with no complaints today.Patient had a bone density study in 2014. Which had demonstrated that her lowest T. Score was at the left femoral neck with a value of -1.8. Her 10 year fracture risk of the hip is 3.8% exceeding 3.0% threshold. Patient's bone mass is 25% below normal. The patient has a 7 times greater risk of hip fracture and 5 time greater risk of spine fracture when compared to a younger population.Prior bone density study done in a different facility with a different machine so comparison not as accurate. Based on patient's age of 64 and her past history of osteoporosis she was started on Reclast IV October of 2014. Patient did not receive her Reclast in 2015?    Patient withhistory of stage III invasive lobular carcinoma of the left breast diagnosed in April 1994, 5-cm primary, ER/PR positive, one node positive, positive nipple involvement. Positive lymphatic invasion. She was treated with left mastectomy, eight cycles of CMF chemotherapy, chest wall radiation, and 5 years of tamoxifen. She has been followed by Dr. Beryle Beams her oncologist. Early this year patient had a right breast biopsy the pathology report was fibroadenoma. Patient has been 20 years without breast cancer recurrence.  Patient would no previous history of abnormal Pap smears.Her primary physician is Dr. Sinda Du who has been doing her lab work in treating her for hypercholesterolemia. Patient's last colonoscopy was in 2015 and benign polyps were removed.Patient had a stereotactic biopsy in 2014 and demonstrated a fibroadenoma. Patient had a mammogram this July for which we do not have records.  Past medical history,surgical history, family history and social history were all reviewed and documented in the EPIC chart.  Gynecologic History No LMP recorded. Patient is postmenopausal. Contraception: post menopausal status Last  Pa2011 Results were: normal Last mammogram 2016 Results were: we do not have reported although patient states it was normal   Obstetric History OB History  Gravida Para Term Preterm AB SAB TAB Ectopic Multiple Living  1 1        1     # Outcome Date GA Lbr Len/2nd Weight Sex Delivery Anes PTL Lv  1 Para                ROS: A ROS was performed and pertinent positives and negatives are included in the history.  GENERAL: No fevers or chills. HEENT: No change in vision, no earache, sore throat or sinus congestion. NECK: No pain or stiffness. CARDIOVASCULAR: No chest pain or pressure. No palpitations. PULMONARY: No shortness of breath, cough or wheeze. GASTROINTESTINAL: No abdominal pain, nausea, vomiting or diarrhea, melena or bright red blood per rectum. GENITOURINARY: No urinary frequency, urgency, hesitancy or dysuria. MUSCULOSKELETAL: No joint or muscle pain, no back pain, no recent trauma. DERMATOLOGIC: No rash, no itching, no lesions. ENDOCRINE: No polyuria, polydipsia, no heat or cold intolerance. No recent change in weight. HEMATOLOGICAL: No anemia or easy bruising or bleeding. NEUROLOGIC: No headache, seizures, numbness, tingling or weakness. PSYCHIATRIC: No depression, no loss of interest in normal activity or change in sleep pattern.     Exam: chaperone present  BP 140/82 mmHg  Ht 5\' 4"  (1.626 m)  Wt 181 lb (82.101 kg)  BMI 31.05 kg/m2  Body mass index is 31.05 kg/(m^2).  General appearance : Well developed well nourished female. No acute distress HEENT: Eyes: no retinal hemorrhage or exudates,  Neck supple, trachea midline,  no carotid bruits, no thyroidmegaly Lungs: Clear to auscultation, no rhonchi or wheezes, or rib retractions  Heart: Regular rate and rhythm, no murmurs or gallops Breast:Examined in sitting and supine positileft breast absent. No palpable masses no supraclavicular or axillary lymphadenopathy Abdomen: Distended soft nontender no rebound or  guarding  Pelvic:  Bartholin, Urethra, Skene Glands: Within normal limits             Vagina: No gross lesions or discharge, atrophic changes  Cervix: No gross lesions or discharge  Uteruaxial, normal size, shape and consistency, non-tender and mobile  Adnexa  Without masses or tenderness  Anus and perineum  normal   Rectovaginal  normal sphincter tone without palpated masses or tenderness             Hemoccult PCP provides    assessment/plan: Patient with past history of osteoporosis had responded well after one year of Reclast IV did not receive her dose in 2015 we'll schedule for her to receive in the next few weeks. We will check her calcium, vitamin D level and PTH level today. Incidental finding during exam patient was found to have a distended abdomen soft nontender no rebound or guarding. Going to order an abdominal ultrasound and pelvic ultrasound and have her follow up as well with her PCP Dr. Luan Pulling   who is been monitoring her for hyperlipidemia and elevated liver function tests and fatty liver. Pap smear not indicated. We discussed importance of calcium vitamin D and weightbearing exercises daily.   Terrance Mass MD, 10:39 AM 02/03/2015

## 2015-02-03 NOTE — Patient Instructions (Signed)

## 2015-02-03 NOTE — Telephone Encounter (Signed)
Spoke with Oleta Mouse at [phone] the head of ultrasound department and was informed pt will need to have transvaginal ultrasound order as well, order will be placed and they will contact pt to reschedule time slots.

## 2015-02-04 ENCOUNTER — Telehealth: Payer: Self-pay

## 2015-02-04 ENCOUNTER — Telehealth: Payer: Self-pay | Admitting: Gynecology

## 2015-02-04 ENCOUNTER — Encounter: Payer: Self-pay | Admitting: Gynecology

## 2015-02-04 LAB — PTH, INTACT AND CALCIUM
CALCIUM: 10.5 mg/dL (ref 8.4–10.5)
PTH: 33 pg/mL (ref 14–64)

## 2015-02-04 LAB — VITAMIN D 25 HYDROXY (VIT D DEFICIENCY, FRACTURES): VIT D 25 HYDROXY: 29 ng/mL — AB (ref 30–100)

## 2015-02-04 LAB — BUN: BUN: 22 mg/dL (ref 7–25)

## 2015-02-04 LAB — CREATININE, SERUM: CREATININE: 0.84 mg/dL (ref 0.60–0.93)

## 2015-02-04 NOTE — Telephone Encounter (Signed)
Patient called in voice mail stating she was checking on Reclast appt Anderson Malta was scheduling for her. I called her back and explained that Anderson Malta did not schedule Reclast. Pam schedules that and I have forwarded Pam a staff message letting her know that patient is ready for appointment. I told patient she will hear from [facility].  I told her that I see that Anderson Malta was scheduling an u/s for her. She was aware of that info and said that has all been worked out.

## 2015-02-04 NOTE — Telephone Encounter (Addendum)
Talked with pt. Regarding blood work for [employer] . Pt needs BUN Creatinine, Calcium. Verbal order to Lab to add the BUN and Creat. To blood work from 02/03/15. Will call pt and schedule Reclast next week after review of blood work and insurance.  Notes from Dr Marylen Ponto, MD Jeannene Patella this patient did not get her Reclast last year. Please schedule. Calcium/vit D and PTH ordered today  She will also need BUN AND Creatinine in addition to the below , must have the BUN, Calcium and Creatinine within a 30 day window to receive the Reclast.  I will try to add this to blood work from yesterday.O  K, thank you Pam

## 2015-02-05 ENCOUNTER — Encounter: Payer: Self-pay | Admitting: Gynecology

## 2015-02-05 NOTE — Telephone Encounter (Signed)
Pt schedule on 02/11/15 and 02/12/15 for both ultrasounds

## 2015-02-09 NOTE — Telephone Encounter (Signed)
Received blood work   Bun 22  Calcium 10.5  Creatinine  0.84  02/03/2015. Reclast information from [facility] insurance No PA required. Pt will have a $100 co pay at facility. Will fax and schedule pt for infusion. She would like to have infusion on 02/22/2015.

## 2015-02-10 ENCOUNTER — Encounter: Payer: Self-pay | Admitting: Gynecology

## 2015-02-11 ENCOUNTER — Ambulatory Visit (HOSPITAL_COMMUNITY)
Admission: RE | Admit: 2015-02-11 | Discharge: 2015-02-11 | Disposition: A | Discharge status: Home / Self Care | Payer: Medicare Other | Source: Ambulatory Visit | Attending: Gynecology | Admitting: Gynecology

## 2015-02-11 DIAGNOSIS — R140 Abdominal distension (gaseous): Secondary | ICD-10-CM

## 2015-02-12 ENCOUNTER — Other Ambulatory Visit (HOSPITAL_COMMUNITY): Payer: Medicare Other

## 2015-02-12 ENCOUNTER — Ambulatory Visit (HOSPITAL_COMMUNITY): Payer: Medicare Other

## 2015-02-15 ENCOUNTER — Ambulatory Visit (HOSPITAL_COMMUNITY): Payer: Medicare Other

## 2015-02-16 ENCOUNTER — Other Ambulatory Visit: Payer: Self-pay | Admitting: Gynecology

## 2015-02-16 ENCOUNTER — Ambulatory Visit (INDEPENDENT_AMBULATORY_CARE_PROVIDER_SITE_OTHER): Payer: Medicare Other | Admitting: Gynecology

## 2015-02-16 ENCOUNTER — Ambulatory Visit (INDEPENDENT_AMBULATORY_CARE_PROVIDER_SITE_OTHER): Payer: Medicare Other

## 2015-02-16 ENCOUNTER — Encounter: Payer: Self-pay | Admitting: Gynecology

## 2015-02-16 VITALS — BP 122/80

## 2015-02-16 DIAGNOSIS — N8320 Unspecified ovarian cysts: Secondary | ICD-10-CM

## 2015-02-16 DIAGNOSIS — N858 Other specified noninflammatory disorders of uterus: Secondary | ICD-10-CM

## 2015-02-16 DIAGNOSIS — B9689 Other specified bacterial agents as the cause of diseases classified elsewhere: Secondary | ICD-10-CM

## 2015-02-16 DIAGNOSIS — N760 Acute vaginitis: Secondary | ICD-10-CM

## 2015-02-16 DIAGNOSIS — B373 Candidiasis of vulva and vagina: Secondary | ICD-10-CM

## 2015-02-16 DIAGNOSIS — R140 Abdominal distension (gaseous): Secondary | ICD-10-CM

## 2015-02-16 DIAGNOSIS — R934 Abnormal findings on diagnostic imaging of urinary organs: Secondary | ICD-10-CM | POA: Diagnosis not present

## 2015-02-16 DIAGNOSIS — R9389 Abnormal findings on diagnostic imaging of other specified body structures: Secondary | ICD-10-CM

## 2015-02-16 DIAGNOSIS — N83202 Unspecified ovarian cyst, left side: Secondary | ICD-10-CM

## 2015-02-16 DIAGNOSIS — A499 Bacterial infection, unspecified: Secondary | ICD-10-CM | POA: Diagnosis not present

## 2015-02-16 DIAGNOSIS — B3731 Acute candidiasis of vulva and vagina: Secondary | ICD-10-CM

## 2015-02-16 DIAGNOSIS — N898 Other specified noninflammatory disorders of vagina: Secondary | ICD-10-CM

## 2015-02-16 LAB — WET PREP FOR TRICH, YEAST, CLUE: Trich, Wet Prep: NONE SEEN

## 2015-02-16 MED ORDER — FLUCONAZOLE 150 MG PO TABS: 150.0000 mg | ORAL_TABLET | Freq: Once | ORAL | Status: DC

## 2015-02-16 MED ORDER — TINIDAZOLE 500 MG PO TABS: ORAL_TABLET | ORAL | Status: DC

## 2015-02-16 NOTE — Patient Instructions (Addendum)
Tinidazole tablets What is this medicine? TINIDAZOLE (tye NI da zole) is an antiinfective. It is used to treat amebiasis, giardiasis, trichomoniasis, and vaginosis. It will not work for colds, flu, or other viral infections. This medicine may be used for other purposes; ask your health care provider or pharmacist if you have questions. COMMON BRAND NAME(S): Tindamax What should I tell my health care provider before I take this medicine? They need to know if you have any of these conditions: -anemia or other blood disorders -if you frequently drink alcohol containing drinks -receiving hemodialysis -seizure disorder -an unusual or allergic reaction to tinidazole, other medicines, foods, dyes, or preservatives -pregnant or trying to get pregnant -breast-feeding How should I use this medicine? Take this medicine by mouth with a full glass of water. Follow the directions on the prescription label. Take with food. Take your medicine at regular intervals. Do not take your medicine more often than directed. Take all of your medicine as directed even if you think you are better. Do not skip doses or stop your medicine early. Talk to your pediatrician regarding the use of this medicine in children. While this drug may be prescribed for children as young as 3 years of age for selected conditions, precautions do apply. Overdosage: If you think you have taken too much of this medicine contact a poison control center or emergency room at once. NOTE: This medicine is only for you. Do not share this medicine with others. What if I miss a dose? If you miss a dose, take it as soon as you can. If it is almost time for your next dose, take only that dose. Do not take double or extra doses. What may interact with this medicine? Do not take this medicine with any of the following medications: -alcohol or any product that contains alcohol -amprenavir oral solution -disulfiram -paclitaxel injection -ritonavir  oral solution -sertraline oral solution -sulfamethoxazole-trimethoprim injection This medicine may also interact with the following medications: -cholestyramine -cimetidine -conivaptan -cyclosporin -fluorouracil -fosphenytoin, phenytoin -ketoconazole -lithium -phenobarbital -tacrolimus -warfarin This list may not describe all possible interactions. Give your health care provider a list of all the medicines, herbs, non-prescription drugs, or dietary supplements you use. Also tell them if you smoke, drink alcohol, or use illegal drugs. Some items may interact with your medicine. What should I watch for while using this medicine? Tell your doctor or health care professional if your symptoms do not improve or if they get worse. Avoid alcoholic drinks while you are taking this medicine and for three days afterward. Alcohol may make you feel dizzy, sick, or flushed. If you are being treated for a sexually transmitted disease, avoid sexual contact until you have finished your treatment. Your sexual partner may also need treatment. What side effects may I notice from receiving this medicine? Side effects that you should report to your doctor or health care professional as soon as possible: -allergic reactions like skin rash, itching or hives, swelling of the face, lips, or tongue -breathing problems -confusion, depression -dark or white patches in the mouth -feeling faint or lightheaded, falls -fever, infection -numbness, tingling, pain or weakness in the hands or feet -pain when passing urine -seizures -unusually weak or tired -vaginal irritation or discharge -vomiting Side effects that usually do not require medical attention (report to your doctor or health care professional if they continue or are bothersome): -dark brown or reddish urine -diarrhea -headache -loss of appetite -metallic taste -nausea -stomach upset This list may not describe all  possible side effects. Call your  doctor for medical advice about side effects. You may report side effects to FDA at 1-800-FDA-1088. Where should I keep my medicine? Keep out of the reach of children. Store at room temperature between 15 and 30 degrees C (59 and 86 degrees F). Protect from light and moisture. Keep container tightly closed. Throw away any unused medicine after the expiration date. NOTE: This sheet is a summary. It may not cover all possible information. If you have questions about this medicine, talk to your doctor, pharmacist, or health care provider.  2015, Elsevier/Gold Standard. (2008-03-09 15:22:28) Fluconazole tablets What is this medicine? FLUCONAZOLE (floo KON na zole) is an antifungal medicine. It is used to treat certain kinds of fungal or yeast infections. This medicine may be used for other purposes; ask your health care provider or pharmacist if you have questions. COMMON BRAND NAME(S): Diflucan What should I tell my health care provider before I take this medicine? They need to know if you have any of these conditions: -electrolyte abnormalities -history of irregular heart beat -kidney disease -an unusual or allergic reaction to fluconazole, other azole antifungals, medicines, foods, dyes, or preservatives -pregnant or trying to get pregnant -breast-feeding How should I use this medicine? Take this medicine by mouth. Follow the directions on the prescription label. Do not take your medicine more often than directed. Talk to your pediatrician regarding the use of this medicine in children. Special care may be needed. This medicine has been used in children as young as 70 months of age. Overdosage: If you think you have taken too much of this medicine contact a poison control center or emergency room at once. NOTE: This medicine is only for you. Do not share this medicine with others. What if I miss a dose? If you miss a dose, take it as soon as you can. If it is almost time for your next dose,  take only that dose. Do not take double or extra doses. What may interact with this medicine? Do not take this medicine with any of the following medications: -astemizole -certain medicines for irregular heart beat like dofetilide, dronedarone, quinidine -cisapride -erythromycin -lomitapide -other medicines that prolong the QT interval (cause an abnormal heart rhythm) -pimozide -terfenadine -thioridazine -tolvaptan -ziprasidone This medicine may also interact with the following medications: -antiviral medicines for HIV or AIDS -birth control pills -certain antibiotics like rifabutin, rifampin -certain medicines for blood pressure like amlodipine, isradipine, felodipine, hydrochlorothiazide, losartan, nifedipine -certain medicines for cancer like cyclophosphamide, vinblastine, vincristine -certain medicines for cholesterol like atorvastatin, lovastatin, fluvastatin, simvastatin -certain medicines for depression, anxiety, or psychotic disturbances like amitriptyline, midazolam, nortriptyline, triazolam -certain medicines for diabetes like glipizide, glyburide, tolbutamide -certain medicines for pain like alfentanil, fentanyl, methadone -certain medicines for seizures like carbamazepine, phenytoin -certain medicines that treat or prevent blood clots like warfarin -halofantrine -medicines that lower your chance of fighting infection like cyclosporine, prednisone, tacrolimus -NSAIDS, medicines for pain and inflammation, like celecoxib, diclofenac, flurbiprofen, ibuprofen, meloxicam, naproxen -other medicines for fungal infections -sirolimus -theophylline -tofacitinib This list may not describe all possible interactions. Give your health care provider a list of all the medicines, herbs, non-prescription drugs, or dietary supplements you use. Also tell them if you smoke, drink alcohol, or use illegal drugs. Some items may interact with your medicine. What should I watch for while using  this medicine? Visit your doctor or health care professional for regular checkups. If you are taking this medicine for a long time you may need  blood work. Tell your doctor if your symptoms do not improve. Some fungal infections need many weeks or months of treatment to cure. Alcohol can increase possible damage to your liver. Avoid alcoholic drinks. If you have a vaginal infection, do not have sex until you have finished your treatment. You can wear a sanitary napkin. Do not use tampons. Wear freshly washed cotton, not synthetic, panties. What side effects may I notice from receiving this medicine? Side effects that you should report to your doctor or health care professional as soon as possible: -allergic reactions like skin rash or itching, hives, swelling of the lips, mouth, tongue, or throat -dark urine -feeling dizzy or faint -irregular heartbeat or chest pain -redness, blistering, peeling or loosening of the skin, including inside the mouth -trouble breathing -unusual bruising or bleeding -vomiting -yellowing of the eyes or skin Side effects that usually do not require medical attention (report to your doctor or health care professional if they continue or are bothersome): -changes in how food tastes -diarrhea -headache -stomach upset or nausea This list may not describe all possible side effects. Call your doctor for medical advice about side effects. You may report side effects to FDA at 1-800-FDA-1088. Where should I keep my medicine? Keep out of the reach of children. Store at room temperature below 30 degrees C (86 degrees F). Throw away any medicine after the expiration date. NOTE: This sheet is a summary. It may not cover all possible information. If you have questions about this medicine, talk to your doctor, pharmacist, or health care provider.  2015, Elsevier/Gold Standard. (2013-01-18 16:13:04) Monilial Vaginitis Vaginitis in a soreness, swelling and redness  (inflammation) of the vagina and vulva. Monilial vaginitis is not a sexually transmitted infection. CAUSES  Yeast vaginitis is caused by yeast (candida) that is normally found in your vagina. With a yeast infection, the candida has overgrown in number to a point that upsets the chemical balance. SYMPTOMS   White, thick vaginal discharge.  Swelling, itching, redness and irritation of the vagina and possibly the lips of the vagina (vulva).  Burning or painful urination.  Painful intercourse. DIAGNOSIS  Things that may contribute to monilial vaginitis are:  Postmenopausal and virginal states.  Pregnancy.  Infections.  Being tired, sick or stressed, especially if you had monilial vaginitis in the past.  Diabetes. Good control will help lower the chance.  Birth control pills.  Tight fitting garments.  Using bubble bath, feminine sprays, douches or deodorant tampons.  Taking certain medications that kill germs (antibiotics).  Sporadic recurrence can occur if you become ill. TREATMENT  Your caregiver will give you medication.  There are several kinds of anti monilial vaginal creams and suppositories specific for monilial vaginitis. For recurrent yeast infections, use a suppository or cream in the vagina 2 times a week, or as directed.  Anti-monilial or steroid cream for the itching or irritation of the vulva may also be used. Get your caregiver's permission.  Painting the vagina with methylene blue solution may help if the monilial cream does not work.  Eating yogurt may help prevent monilial vaginitis. HOME CARE INSTRUCTIONS   Finish all medication as prescribed.  Do not have sex until treatment is completed or after your caregiver tells you it is okay.  Take warm sitz baths.  Do not douche.  Do not use tampons, especially scented ones.  Wear cotton underwear.  Avoid tight pants and panty hose.  Tell your sexual partner that you have a yeast infection. They  should go to their caregiver if they have symptoms such as mild rash or itching.  Your sexual partner should be treated as well if your infection is difficult to eliminate.  Practice safer sex. Use condoms.  Some vaginal medications cause latex condoms to fail. Vaginal medications that harm condoms are:  Cleocin cream.  Butoconazole (Femstat).  Terconazole (Terazol) vaginal suppository.  Miconazole (Monistat) (may be purchased over the counter). SEEK MEDICAL CARE IF:   You have a temperature by mouth above 102 F (38.9 C).  The infection is getting worse after 2 days of treatment.  The infection is not getting better after 3 days of treatment.  You develop blisters in or around your vagina.  You develop vaginal bleeding, and it is not your menstrual period.  You have pain when you urinate.  You develop intestinal problems.  You have pain with sexual intercourse. Document Released: 03/22/2005 Document Revised: 09/04/2011 Document Reviewed: 12/04/2008 [facility] Patient Information 2015 [place], [place]. This information is not intended to replace advice given to you by your health care provider. Make sure you discuss any questions you have with your health care provider.  CA-125 Tumor Marker CA 125 is a tumor marker that is used to help monitor the course of ovarian or endometrial cancer. PREPARATION FOR TEST No preparation is necessary. NORMAL FINDINGS Adults: 0-35 units/mL (0-35 kilounits)/L Ranges for normal findings may vary among different laboratories and hospitals. You should always check with your doctor after having lab work or other tests done to discuss the meaning of your test results and whether your values are considered within normal limits. MEANING OF TEST  Your caregiver will go over the test results with you and discuss the importance and meaning of your results, as well as treatment options and the need for additional tests if necessary. OBTAINING THE  TEST RESULTS It is your responsibility to obtain your test results. Ask the lab or department performing the test when and how you will get your results. Document Released: 07/04/2004 Document Revised: 09/04/2011 Document Reviewed: 05/20/2008 [facility] Patient Information 2015 [place], [place]. This information is not intended to replace advice given to you by your health care provider. Make sure you discuss any questions you have with your health care provider. Ovarian Cyst An ovarian cyst is a fluid-filled sac that forms on an ovary. The ovaries are small organs that produce eggs in women. Various types of cysts can form on the ovaries. Most are not cancerous. Many do not cause problems, and they often go away on their own. Some may cause symptoms and require treatment. Common types of ovarian cysts include:  Functional cysts--These cysts may occur every month during the menstrual cycle. This is normal. The cysts usually go away with the next menstrual cycle if the woman does not get pregnant. Usually, there are no symptoms with a functional cyst.  Endometrioma cysts--These cysts form from the tissue that lines the uterus. They are also called "chocolate cysts" because they become filled with blood that turns brown. This type of cyst can cause pain in the lower abdomen during intercourse and with your menstrual period.  Cystadenoma cysts--This type develops from the cells on the outside of the ovary. These cysts can get very big and cause lower abdomen pain and pain with intercourse. This type of cyst can twist on itself, cut off its blood supply, and cause severe pain. It can also easily rupture and cause a lot of pain.  Dermoid cysts--This type of cyst is sometimes  found in both ovaries. These cysts may contain different kinds of body tissue, such as skin, teeth, hair, or cartilage. They usually do not cause symptoms unless they get very big.  Theca lutein cysts--These cysts occur when too much of a  certain hormone (human chorionic gonadotropin) is produced and overstimulates the ovaries to produce an egg. This is most common after procedures used to assist with the conception of a baby (in vitro fertilization). CAUSES   Fertility drugs can cause a condition in which multiple large cysts are formed on the ovaries. This is called ovarian hyperstimulation syndrome.  A condition called polycystic ovary syndrome can cause hormonal imbalances that can lead to nonfunctional ovarian cysts. SIGNS AND SYMPTOMS  Many ovarian cysts do not cause symptoms. If symptoms are present, they may include:  Pelvic pain or pressure.  Pain in the lower abdomen.  Pain during sexual intercourse.  Increasing girth (swelling) of the abdomen.  Abnormal menstrual periods.  Increasing pain with menstrual periods.  Stopping having menstrual periods without being pregnant. DIAGNOSIS  These cysts are commonly found during a routine or annual pelvic exam. Tests may be ordered to find out more about the cyst. These tests may include:  Ultrasound.  X-ray of the pelvis.  CT scan.  MRI.  Blood tests. TREATMENT  Many ovarian cysts go away on their own without treatment. Your health care provider may want to check your cyst regularly for 2-3 months to see if it changes. For women in menopause, it is particularly important to monitor a cyst closely because of the higher rate of ovarian cancer in menopausal women. When treatment is needed, it may include any of the following:  A procedure to drain the cyst (aspiration). This may be done using a long needle and ultrasound. It can also be done through a laparoscopic procedure. This involves using a thin, lighted tube with a tiny camera on the end (laparoscope) inserted through a small incision.  Surgery to remove the whole cyst. This may be done using laparoscopic surgery or an open surgery involving a larger incision in the lower abdomen.  Hormone treatment or  birth control pills. These methods are sometimes used to help dissolve a cyst. HOME CARE INSTRUCTIONS   Only take over-the-counter or prescription medicines as directed by your health care provider.  Follow up with your health care provider as directed.  Get regular pelvic exams and Pap tests. SEEK MEDICAL CARE IF:   Your periods are late, irregular, or painful, or they stop.  Your pelvic pain or abdominal pain does not go away.  Your abdomen becomes larger or swollen.  You have pressure on your bladder or trouble emptying your bladder completely.  You have pain during sexual intercourse.  You have feelings of fullness, pressure, or discomfort in your stomach.  You lose weight for no apparent reason.  You feel generally ill.  You become constipated.  You lose your appetite.  You develop acne.  You have an increase in body and facial hair.  You are gaining weight, without changing your exercise and eating habits.  You think you are pregnant. SEEK IMMEDIATE MEDICAL CARE IF:   You have increasing abdominal pain.  You feel sick to your stomach (nauseous), and you throw up (vomit).  You develop a fever that comes on suddenly.  You have abdominal pain during a bowel movement.  Your menstrual periods become heavier than usual. MAKE SURE YOU:  Understand these instructions.  Will watch your condition.  Will  get help right away if you are not doing well or get worse. Document Released: 06/12/2005 Document Revised: 06/17/2013 Document Reviewed: 02/17/2013 [facility] Patient Information 2015 [place], [place]. This information is not intended to replace advice given to you by your health care provider. Make sure you discuss any questions you have with your health care provider.

## 2015-02-16 NOTE — Progress Notes (Signed)
Patient is a 74 year old who was seen in the office for annual gynecological examination on 02/03/2015. See previous note for details. Patient was noted to have a distended soft abdomen which initiated the evaluation to consist of an upper abdominal ultrasound as well as a pelvic ultrasound which was done at the hospital recently. Patient does have a history of stage III invasive lobular carcinoma of the left breast diagnosed in April 1994, 5-cm primary, ER/PR positive, one node positive, positive nipple involvement. Positive lymphatic invasion. She was treated with left mastectomy, eight cycles of CMF chemotherapy, chest wall radiation, and 5 years of tamoxifen. She has been followed by Dr. Beryle Beams her oncologist. Early this year patient had a right breast biopsy the pathology report was fibroadenoma. Patient has been 20 years without breast cancer recurrence. Patient has denied any vaginal bleeding.  Hospital ultrasound done August 18 demonstrated the following: Abdominal ultrasound was unremarkable  Pelvic ultrasound: FINDINGS: Uterus  Measurements: 7.1 x 2.5 x 3.9 cm. No fibroids or other mass visualized.  Endometrium  The endometrial cavity is distended with fluid.  Right and left ovaries not visualized.  Other findings  No free fluid.  IMPRESSION: Limited exam.  The endometrial cavity appears to be distended with fluid. This may be secondary to cervical stenosis or potentially cervical malignancy. Recommend clinical correlation and direct visualization.  The right and left ovaries are not visualized.  I had requested the radiologist Communion endometrial stripe and the following was the addendum he had a 2 report: There is limited visualization of the endometrium and an adequate measurement of the endometrial thickness is not able to be performed, specifically given the technical difficulties with endometrial visualization and the distention of the  endometrium with Fluid.  Patient presented to the office today for an attempted endometrial biopsy. It was noted that she had a discharge so wet prep was done which demonstrated moderate yeast and positive amine, many clue cells, too numerous to count WBC and too numerous to count bacteria. The cervical os could barely be visualized and the vagina and cervix were friable. The patient was quite uncomfortable. An ultrasound was repeated but here in my office today to see if we can get a better assessment on the endometrial stripe that was not reported in the previous scan at the hospital. Transvaginal and transabdominal ultrasound and office today demonstrated the following:  Uterus measures 7.5 x 4.9 x 2.7 cm endometrial stripe 5.1 mm. Fluid was noted endometrial cavity. Avascular with echogenic floating debris. Right ovary not seen. No apparent right adnexal masses. Left ovary had 2 thin avascular cyst one measuring 13 x 10 x 11 mm the second one measuring 19 x 9 mm with numerous echogenic foci within the ovary less than 20 mm at the wall of each cyst, left ovary with arterial blood flow seen. Negative CDS.   Assessment/plan: #1 yeast vaginitis patient will be prescribed Diflucan 150 mg one by mouth #2 bacterial vaginosis patient we prescribed Tindamax 500 mg tablet she will take 4 tablets today and repeat in 24 hours  #3 borderline endometrial thickness no vaginal bleeding some fluid noted in the uterine cavity. Very stenotic cervical loss. 2 simple appearing right ovarian cyst thinwall avascular. #4 because of the above were going to obtain an ovarian cancer screening with a ROMA-1 blood test. #5 if the above blood tests is normal she will return back in 6 months and will follow-up on the simple cyst as well as on the endometrial stripe she  has any vaginal bleeding. Patient with multiple medical comorbidities

## 2015-02-16 NOTE — Addendum Note (Signed)
Addended by: Terrance Mass on: 02/16/2015 03:58 PM   Modules accepted: Orders

## 2015-02-20 LAB — OVARIAN MALIGNANCY RISK-ROMA
CA125: 24 U/mL (ref <35)
HE4: 72 pM (ref <151)
ROMA POSTMENOPAUSAL: 2.12 (ref <2.77)
ROMA PREMENOPAUSAL: 1.65 — AB (ref <1.31)

## 2015-02-22 ENCOUNTER — Ambulatory Visit (HOSPITAL_COMMUNITY)
Admission: RE | Admit: 2015-02-22 | Discharge: 2015-02-22 | Disposition: A | Discharge status: Home / Self Care | Payer: Medicare Other | Source: Ambulatory Visit | Attending: Gynecology | Admitting: Gynecology

## 2015-02-22 DIAGNOSIS — M810 Age-related osteoporosis without current pathological fracture: Secondary | ICD-10-CM | POA: Diagnosis present

## 2015-02-22 MED ORDER — ZOLEDRONIC ACID 5 MG/100ML IV SOLN
INTRAVENOUS | Status: AC
  Filled 2015-02-22: qty 100

## 2015-02-22 MED ORDER — ZOLEDRONIC ACID 5 MG/100ML IV SOLN
5.0000 mg | Freq: Once | INTRAVENOUS | Status: AC
  Administered 2015-02-22: 5 mg via INTRAVENOUS

## 2015-02-22 NOTE — Telephone Encounter (Signed)
Infusion today 02/22/2015 next due after 02/23/2016. Patient called me at home this weekend asking about instructions she received phone call from office to remind her of appointment. I told patient to follow the paper instructions that were mailed to her.

## 2015-02-23 ENCOUNTER — Telehealth: Payer: Self-pay | Admitting: *Deleted

## 2015-02-23 NOTE — Telephone Encounter (Signed)
-----   Message from Terrance Mass, MD sent at 02/22/2015  7:54 AM EDT ----- Anderson Malta, I would like to refer this patient to the GYN oncologist in reference to endometrial thickness, distended endometrial cavity with fluid, endometrial thickness, and ovarian cyst was elevated tumor marker.

## 2015-02-23 NOTE — Telephone Encounter (Signed)
Appointment on 02/24/15 with Dr. Nancy Marus @ 1:00pm pt aware.

## 2015-02-24 ENCOUNTER — Other Ambulatory Visit (HOSPITAL_COMMUNITY)
Admission: RE | Admit: 2015-02-24 | Discharge: 2015-02-24 | Disposition: A | Discharge status: Home / Self Care | Payer: Medicare Other | Source: Ambulatory Visit | Attending: Gynecologic Oncology | Admitting: Gynecologic Oncology

## 2015-02-24 ENCOUNTER — Ambulatory Visit: Payer: Medicare Other | Attending: Gynecologic Oncology | Admitting: Gynecologic Oncology

## 2015-02-24 ENCOUNTER — Encounter: Payer: Self-pay | Admitting: Gynecologic Oncology

## 2015-02-24 VITALS — BP 151/82 | HR 86 | Temp 98.0°F | Resp 18 | Ht 64.0 in | Wt 182.4 lb

## 2015-02-24 DIAGNOSIS — R934 Abnormal findings on diagnostic imaging of urinary organs: Secondary | ICD-10-CM | POA: Diagnosis not present

## 2015-02-24 DIAGNOSIS — Z01411 Encounter for gynecological examination (general) (routine) with abnormal findings: Secondary | ICD-10-CM | POA: Insufficient documentation

## 2015-02-24 DIAGNOSIS — R9389 Abnormal findings on diagnostic imaging of other specified body structures: Secondary | ICD-10-CM

## 2015-02-24 DIAGNOSIS — N882 Stricture and stenosis of cervix uteri: Secondary | ICD-10-CM

## 2015-02-24 LAB — HM PAP SMEAR: HM Pap smear: NEGATIVE

## 2015-02-24 NOTE — Patient Instructions (Signed)
We will call you with the results of your pap smear from today.  Plan to have a repeat ultrasound in three months at [facility] and we will call you with the results of that as well.  Please call for any questions or concerns.

## 2015-02-24 NOTE — Progress Notes (Signed)
Consult Note: Gyn-Onc  Tammy Terry 74 y.o. female  CC:  Chief Complaint  Patient presents with  . increased endometrial stripe thickness    New consult    HPI: Patient is seen today in consultation at the request of Dr. Uvaldo Rising  This Darrough is a very pleasant 73 year old with history of stage III invasive lobular carcinoma of the left breast diagnosed in April 1994, 5-cm primary, ER/PR positive, one node positive, positive nipple involvement. Positive lymphatic invasion. She was treated with left mastectomy, eight cycles of CMF chemotherapy, chest wall radiation, and 5 years of tamoxifen. She has been followed by Dr. Beryle Beams her oncologist. Patient has been 20 years without breast cancer recurrence. Last Pap smear July 2011 was negative  Dr. Toney Rakes saw her in clinic and felt that her abdomen was distended and ordered an ultrasound.  She had an ultrasound performed that revealed:  Uterus measures 7.5 x 4.9 x 2.7 cm endometrial stripe 5.1 mm. Fluid was noted endometrial cavity. Avascular with echogenic floating debris. Right ovary not seen. No apparent right adnexal masses. Left ovary had 2 thin avascular cyst one measuring 13 x 10 x 11 mm the second one measuring 19 x 9 mm with numerous echogenic foci within the ovary less than 20 mm at the wall of each cyst, left ovary with arterial blood flow seen. Negative CDS.  She had a ROMA drawn that was normal at 2.12. CA-125 was 24 and a HE4 was 72. Because of the fluid noted in the endometrial cavity we are asked to see her today. Of note per his report she's denies any vaginal bleeding.  The patient is overall doing well and really denies any significant complaints. She states that she's been menopausal since her 57s. She did take some hormone replacement therapy but is not sure how long she took it. She's not taken any since her diagnosis of her breast cancer. She states that she does not feel that her abdomen is any larger than it  normally is. She believes that she is just "fatty" he needs to lose weight. She is very sedentary and does not get any exercise.  Review of Systems  Constitutional: Denies fever. Skin: No rash Cardiovascular: No chest pain, shortness of breath, or edema  Pulmonary: No cough Gastro Intestinal: No nausea, vomiting, constipation, or diarrhea reported. No change in bowel movement.  Genitourinary: Denies vaginal bleeding and discharge.  Musculoskeletal: No joint swelling or pain.  Neurologic: No weakness Psychology: No changes  Current Meds:  Outpatient Encounter Prescriptions as of 02/24/2015  Medication Sig  . acetaminophen (TYLENOL) 325 MG tablet Take 650 mg by mouth every 4 (four) hours as needed for mild pain.   . calcium carbonate (OS-CAL) 600 MG TABS tablet Take 600 mg by mouth 2 (two) times daily with a meal.  . Cholecalciferol (VITAMIN D-3 PO) Take 2,000 Int'l Units by mouth daily.  . clonazePAM (KLONOPIN) 0.5 MG tablet Take 0.25 mg by mouth 4 (four) times daily as needed for anxiety.   Marland Kitchen losartan (COZAAR) 50 MG tablet Take 50 mg by mouth daily.  . propranolol (INDERAL) 10 MG tablet Take 10 mg by mouth daily.   Marland Kitchen venlafaxine (EFFEXOR) 75 MG tablet Take 75 mg by mouth 2 (two) times daily.  . [DISCONTINUED] fluconazole (DIFLUCAN) 150 MG tablet Take 1 tablet (150 mg total) by mouth once.  . [DISCONTINUED] tinidazole (TINDAMAX) 500 MG tablet Take four tablets today and four tablets tomorrow at the same time  No facility-administered encounter medications on file as of 02/24/2015.    Allergy:  Allergies  Allergen Reactions  . Amoxicillin   . Codeine     Social Hx:   Social History   Social History  . Marital Status: Married    Spouse Name: N/A  . Number of Children: N/A  . Years of Education: N/A   Occupational History  . Not on file.   Social History Main Topics  . Smoking status: Never Smoker   . Smokeless tobacco: Not on file  . Alcohol Use: No  . Drug Use: No  .  Sexual Activity: No   Other Topics Concern  . Not on file   Social History Narrative    Past Surgical Hx:  Past Surgical History  Procedure Laterality Date  . Mastectomy      left breast for cancer in 1994  . Breast surgery  1994    LEFT MASTECTOMY   . Cesarean section    . Cataract extraction Left   . Capsulotomy  august 2015    of left eye  . Colonoscopy N/A 05/13/2014    Procedure: COLONOSCOPY;  Surgeon: Rogene Houston, MD;  Location: [facility];  Service: Endoscopy;  Laterality: N/A;  29    Past Medical Hx:  Past Medical History  Diagnosis Date  . Depression with anxiety 03/01/2012  . Hyperlipidemia 03/01/2012  . Hypertension   . Cancer 1994    BREAST CANCER, TREATED WITH RADIATION AND CHEMOTHERAPY  . Anxiety   . H/O vitamin D deficiency     Oncology Hx:    Breast cancer, stage 3   09/29/1992 Initial Diagnosis Breast cancer, stage 3; 5 cm primary, one lymph node positive, positive nipple involvement and lymphatic invasion, left mastectomy, CMF x8 cycles, chest wall radiation, 5 years tamoxifen   01/22/2013 Procedure Biopsy of calcifications, benign   01/22/2013 Mammogram 2 clusters of calcifications    Family Hx:  Family History  Problem Relation Age of Onset  . Diabetes Paternal Grandmother     Vitals:  Blood pressure 151/82, pulse 86, temperature 98 F (36.7 C), temperature source Oral, resp. rate 18, height 5\' 4"  (1.626 m), weight 182 lb 6.4 oz (82.736 kg), SpO2 98 %.  Physical Exam: Well-nourished well-developed female in no acute distress.  Pelvic: External genitalia within normal limits. Vagina is markedly atrophic. The cervix is visualized is no gross visible lesions. The os is very flush with the remainder the cervix which is flush with vagina. Thin prep Pap smear was admitted without difficulty. After obtaining the patient's verbal consent, the cervix on the posterior lip was grasped with single-tooth tenaculum. Attempt was made to enter through the  cervical os with an os finder as well as the endometrial biopsy [place]. However, could not secondary to cervical stenosis. Bimanual examination the cervix is palpably normal. The uterus is not markedly enlarged. I cannot appreciate the size of the adnexa. However, exam is limited by habitus.  Assessment/Plan: 74 year old who underwent an ultrasound secondary concerns of abdominal distention. Ultrasound was notable for endometrial cavity that was distended to 5.1 mm. There was fluid noted within the endometrial cavity with avascular echogenic floating debris. The right ovary was not seen. The left ovary had to thin avascular cyst. One measured 1.3 x 1.0 x 1.1 cm and the second one measured 1.9 x 0.9 cm. CA-125 and a HE4 were both normal. The patient was primarily referred secondarily endometrial stripe thickness of greater than 5 mm.  We  were not able to proceed with an endometrial biopsy today secondary to cervical stenosis. Patient was offered the options of proceeding with a D&C in the operating room versus expectant management. I believe the likelihood of this being a malignancy is quite well. After our discussion the patient wishes to proceed with close follow-up. She'll return for an ultrasound in 3 months to evaluate both the adnexa as well as the endometrial stripe thickness. I will call her with those results. She understands that the endometrial stripe thickness is thicker we will need to proceed with a D&C in the operating room. She was given precautions for which to call us, namely vaginal bleeding.  Rafan Sanders A., MD 02/24/2015, 1:41 PM

## 2015-03-02 ENCOUNTER — Telehealth: Payer: Self-pay | Admitting: Nurse Practitioner

## 2015-03-02 LAB — CYTOLOGY - PAP

## 2015-03-02 NOTE — Telephone Encounter (Signed)
Per Joylene John, NP, patient informed of normal PAP. She verbalizes understanding and is aware to call clinic with questions or concerns.

## 2015-03-11 ENCOUNTER — Other Ambulatory Visit (HOSPITAL_COMMUNITY): Payer: Self-pay | Admitting: Pulmonary Disease

## 2015-03-11 DIAGNOSIS — M5442 Lumbago with sciatica, left side: Secondary | ICD-10-CM

## 2015-03-22 ENCOUNTER — Ambulatory Visit (HOSPITAL_COMMUNITY)
Admission: RE | Admit: 2015-03-22 | Discharge: 2015-03-22 | Disposition: A | Discharge status: Home / Self Care | Payer: Medicare Other | Source: Ambulatory Visit | Attending: Pulmonary Disease | Admitting: Pulmonary Disease

## 2015-03-22 DIAGNOSIS — M4806 Spinal stenosis, lumbar region: Secondary | ICD-10-CM | POA: Insufficient documentation

## 2015-03-22 DIAGNOSIS — M5442 Lumbago with sciatica, left side: Secondary | ICD-10-CM

## 2015-03-22 DIAGNOSIS — M545 Low back pain: Secondary | ICD-10-CM | POA: Insufficient documentation

## 2015-03-22 DIAGNOSIS — M5126 Other intervertebral disc displacement, lumbar region: Secondary | ICD-10-CM | POA: Insufficient documentation

## 2015-05-24 ENCOUNTER — Ambulatory Visit (HOSPITAL_COMMUNITY): Payer: Medicare Other

## 2015-05-24 ENCOUNTER — Ambulatory Visit (HOSPITAL_COMMUNITY)
Admission: RE | Admit: 2015-05-24 | Discharge: 2015-05-24 | Disposition: A | Discharge status: Home / Self Care | Payer: Medicare Other | Source: Ambulatory Visit | Attending: Gynecologic Oncology | Admitting: Gynecologic Oncology

## 2015-05-24 DIAGNOSIS — N83202 Unspecified ovarian cyst, left side: Secondary | ICD-10-CM | POA: Insufficient documentation

## 2015-05-24 DIAGNOSIS — R9389 Abnormal findings on diagnostic imaging of other specified body structures: Secondary | ICD-10-CM

## 2015-05-24 DIAGNOSIS — R938 Abnormal findings on diagnostic imaging of other specified body structures: Secondary | ICD-10-CM | POA: Diagnosis present

## 2015-06-01 ENCOUNTER — Other Ambulatory Visit: Payer: Self-pay | Admitting: Gynecologic Oncology

## 2015-06-01 DIAGNOSIS — R9389 Abnormal findings on diagnostic imaging of other specified body structures: Secondary | ICD-10-CM

## 2015-06-01 NOTE — Progress Notes (Signed)
Patient notified of ultrasound results by Dr. Alycia Rossetti.  Received orders to schedule the patient for a repeat ultrasound in 4 months to evaluate endometrial stripe.

## 2015-07-01 ENCOUNTER — Ambulatory Visit (INDEPENDENT_AMBULATORY_CARE_PROVIDER_SITE_OTHER): Payer: Medicare Other | Admitting: Otolaryngology

## 2015-07-01 DIAGNOSIS — H608X3 Other otitis externa, bilateral: Secondary | ICD-10-CM | POA: Diagnosis not present

## 2015-07-01 DIAGNOSIS — H903 Sensorineural hearing loss, bilateral: Secondary | ICD-10-CM | POA: Diagnosis not present

## 2015-07-01 DIAGNOSIS — H6123 Impacted cerumen, bilateral: Secondary | ICD-10-CM | POA: Diagnosis not present

## 2015-09-01 ENCOUNTER — Ambulatory Visit (HOSPITAL_COMMUNITY): Payer: Medicare Other

## 2015-10-06 ENCOUNTER — Other Ambulatory Visit (HOSPITAL_COMMUNITY): Payer: Self-pay | Admitting: Pulmonary Disease

## 2015-10-06 DIAGNOSIS — W19XXXA Unspecified fall, initial encounter: Secondary | ICD-10-CM

## 2015-10-13 ENCOUNTER — Ambulatory Visit (INDEPENDENT_AMBULATORY_CARE_PROVIDER_SITE_OTHER): Payer: Medicare Other | Admitting: Neurology

## 2015-10-13 ENCOUNTER — Encounter: Payer: Self-pay | Admitting: Neurology

## 2015-10-13 VITALS — BP 162/92 | HR 94 | Resp 16 | Ht 64.0 in | Wt 172.0 lb

## 2015-10-13 DIAGNOSIS — R251 Tremor, unspecified: Secondary | ICD-10-CM

## 2015-10-13 DIAGNOSIS — R29818 Other symptoms and signs involving the nervous system: Secondary | ICD-10-CM | POA: Diagnosis not present

## 2015-10-13 DIAGNOSIS — R2689 Other abnormalities of gait and mobility: Secondary | ICD-10-CM

## 2015-10-13 NOTE — Patient Instructions (Addendum)
  Please remember, that any kind of tremor may be exacerbated by anxiety, anger, nervousness, excitement, dehydration, sleep deprivation, by caffeine, and low blood sugar values or blood sugar fluctuations. Some medications, especially some antidepressants and lithium can cause or exacerbate tremors. Tremors may temporarily calm down her subside with the use of a benzodiazepine such as Valium or related medications and with alcohol. Be aware however that drinking alcohol is not an approved treatment or appropriate treatment for tremor control and long-term use of benzodiazepines such as Valium, lorazepam, alprazolam, or clonazepam can cause habit formation, physical and psychological addiction.  You don't have any telltale signs of parkinson's.   Your tremor is mild and may have been worse from the venlafaxine.   I think we can monitor your symptoms.   The clonazepam may affect your balance.   Please remember to try to maintain good sleep hygiene, which means: Keep a regular sleep and wake schedule, try not to exercise or have a meal within 2 hours of your bedtime, try to keep your bedroom conducive for sleep, that is, cool and dark, without light distractors such as an illuminated alarm clock, and refrain from watching TV right before sleep or in the middle of the night and do not keep the TV or radio on during the night. Also, try not to use or play on electronic devices at bedtime, such as your cell phone, tablet PC or laptop. If you like to read at bedtime on an electronic device, try to dim the background light as much as possible. Do not eat in the middle of the night.   Let's do a 6 month check up.   Please discuss physical therapy with Dr. Luan Pulling - he may refer you locally.

## 2015-10-13 NOTE — Progress Notes (Signed)
Subjective:    Patient ID: SALIA ABUNDIZ is a 75 y.o. female.  HPI     Star Age, MD, PhD [facility] [address] [place], [place] [address]  Dear Dr. Luan Pulling,  I saw your patient, Jorie Betz, upon your kind request in my neurologic clinic today for initial consultation of her tremors and balance disorder. The patient is accompanied by her husband today. As you know, Ms. Pomaville is a very pleasant 75 year old right-handed woman with an underlying medical history of Obesity, hypertension, hyperlipidemia, fatty liver, osteoporosis, breast cancer In 1994, status post chemotherapy and radiation therapy, left mastectomy, anxiety, depression, rosacea, status post tonsillectomy, who reports a b/l hand tremor for the past few years and Perhaps worsening over the past year. She first noted trembling in her handwriting. She has no family history of tremors and no family history of Parkinson's disease. Of note, she has been treated with Effexor for the past year or so. She may not have been on it for the past full year. She says it was started for lack of motivation. She feels it has helped. Currently she takes 225 mg daily. In addition, she takes clonazepam 0.25 mg 4 times a day. She takes tramadol as needed, it does make her sleepy and she takes it at night. She has noted balance problems in the past year or years. She has fallen and thankfully not injured herself but has fallen more than a few times. She now walks with a cane, typically only outside and not even all the time outside depending on the distance she has to walk on the ground she has to cover as an uneven ground or so. She does not drink caffeine. She is a nonsmoker. She lives with her husband. She sees a Teacher, music. She also sees a pain management doctor. She has not used a walker. She has not seen physical therapy at.  I reviewed your office note from 09/28/2015. She had blood work on  09/11/2014 which I reviewed. CBC with differential was unremarkable, lipid panel showed total cholesterol of 208, triglycerides 159, LDL of 119, TSH normal at 1.574.  Her Past Medical History Is Significant For: Past Medical History  Diagnosis Date  . Depression with anxiety 03/01/2012  . Hyperlipidemia 03/01/2012  . Hypertension   . Cancer ([place]) 1994    BREAST CANCER, TREATED WITH RADIATION AND CHEMOTHERAPY  . Anxiety   . H/O vitamin D deficiency   . Age-related osteoporosis without current pathological fracture   . Fatty liver   . Rosacea     Her Past Surgical History Is Significant For: Past Surgical History  Procedure Laterality Date  . Mastectomy      left breast for cancer in 1994  . Breast surgery  1994    LEFT MASTECTOMY   . Cesarean section    . Cataract extraction Left   . Capsulotomy  august 2015    of left eye  . Colonoscopy N/A 05/13/2014    Procedure: COLONOSCOPY;  Surgeon: Rogene Houston, MD;  Location: [facility];  Service: Endoscopy;  Laterality: N/A;  930  . Tonsillectomy      Her Family History Is Significant For: Family History  Problem Relation Age of Onset  . Diabetes Paternal Grandmother   . Heart attack Father     Her Social History Is Significant For: Social History   Social History  . Marital Status: Married    Spouse Name: N/A  .  Number of Children: 1  . Years of Education: college   Occupational History  . Retired    Social History Main Topics  . Smoking status: Never Smoker   . Smokeless tobacco: None  . Alcohol Use: No  . Drug Use: No  . Sexual Activity: No   Other Topics Concern  . None   Social History Narrative   Denies caffeine use     Her Allergies Are:  Allergies  Allergen Reactions  . Amoxicillin   . Codeine   :   Her Current Medications Are:  Outpatient Encounter Prescriptions as of 10/13/2015  Medication Sig  . calcium carbonate (OS-CAL) 600 MG TABS tablet Take 600 mg by mouth 2 (two) times daily with a  meal.  . Cholecalciferol (VITAMIN D-3 PO) Take 2,000 Int'l Units by mouth daily.  . clonazePAM (KLONOPIN) 0.5 MG tablet Take 0.25 mg by mouth 4 (four) times daily as needed for anxiety.   Marland Kitchen ibuprofen (ADVIL,MOTRIN) 200 MG tablet Take 200 mg by mouth every 6 (six) hours as needed.  Marland Kitchen losartan (COZAAR) 50 MG tablet Take 50 mg by mouth daily.  . Polyethylene Glycol 3350 (MIRALAX PO) Take by mouth.  . traMADol (ULTRAM) 50 MG tablet Take by mouth every 6 (six) hours as needed.  . venlafaxine (EFFEXOR) 75 MG tablet Take 225 mg by mouth daily.   . [DISCONTINUED] acetaminophen (TYLENOL) 325 MG tablet Take 650 mg by mouth every 4 (four) hours as needed for mild pain.   . [DISCONTINUED] losartan (COZAAR) 100 MG tablet Take 100 mg by mouth daily.  . [DISCONTINUED] propranolol (INDERAL) 10 MG tablet Take 10 mg by mouth daily.    No facility-administered encounter medications on file as of 10/13/2015.  : Review of Systems:  Out of a complete 14 point review of systems, all are reviewed and negative with the exception of these symptoms as listed below:   Review of Systems  Neurological: Positive for tremors.       Patient reports that she has tremors in both hand for years. She states that in the last year it has become worse and has trouble writting.     Objective:  Neurologic Exam  Physical Exam Physical Examination:   Filed Vitals:   10/13/15 1548  BP: 162/92  Pulse: 94  Resp: 16    General Examination: The patient is a very pleasant 74 y.o. female in no acute distress. She appears well-developed and well-nourished and well groomed.   HEENT: Normocephalic, atraumatic, pupils are equal, round and reactive to light and accommodation. Funduscopic exam is normal with sharp disc margins noted. Extraocular tracking is good without limitation to gaze excursion or nystagmus noted. Normal smooth pursuit is noted. Hearing is grossly intact. Face is symmetric with normal facial animation and normal  facial sensation. Speech is clear with no dysarthria noted. There is no hypophonia. There is no lip, neck/head, jaw or voice tremor. Neck is supple with full range of passive and active motion. There are no carotid bruits on auscultation. Oropharynx exam reveals: mild mouth dryness, adequate dental hygiene and mild airway crowding.   Chest: Clear to auscultation without wheezing, rhonchi or crackles noted.  Heart: S1+S2+0, regular and normal without murmurs, rubs or gallops noted.   Abdomen: Soft, non-tender and non-distended with normal bowel sounds appreciated on auscultation.  Extremities: There is trace pitting edema in the distal lower extremities bilaterally. Pedal pulses are intact.  Skin: Warm and dry without trophic changes noted.   Musculoskeletal: exam  reveals no obvious joint deformities, tenderness or joint swelling or erythema.   Neurologically:  Mental status: The patient is awake, alert and oriented in all 4 spheres. Her immediate and remote memory, attention, language skills and fund of knowledge are fairly appropriate but she seems to have mild short-term memory issues. She is relying on her notebook and calendar for additional information. There is no evidence of aphasia, agnosia, apraxia or anomia. Speech is clear with normal prosody and enunciation. Thought process is linear. Mood is normal and affect is normal.  Cranial nerves II - XII are as described above under HEENT exam. In addition: shoulder shrug is normal with mildly on equal shoulder height, left lower than right.  Motor exam: Normal bulk, strength and tone is noted. There is no drift, resting tremor or rebound. Romberg is difficult to test and she is insecure. She does stand wide-based with this. Reflexes are 1+ in the upper extremities and trace in the lower extremities. Toes are downgoing. Fine motor skills are fairly well-preserved, certainly no telltale decrement in amplitude noted. She has no lateralization  either. She has a mild bilateral upper extremity postural tremor and a minimal action tremor. On Archimedes spiral drawing there is mild tremulousness noted, handwriting is mildly tremulous, not particularly micrographic but she says that she is better with print rather than cursive. She stands up with mild insecurity of difficulty. She stands slightly wide-based. She is able to walk without her cane. Left  shoulder is slightly lower than right. She walks with caution, slightly slow, turns a little quickly, and has some hesitation with tandem walk which is eventually not possible. She has a slight decrease in arm swing on the left.  Sensory exam: intact to light touch, pinprick, vibration, temperature sense in the upper and lower extremities.   Assessment and Plan:   In summary, GRACELEE ROMBACH is a very pleasant 75 y.o.-year old female with an underlying medical history of Obesity, hypertension, hyperlipidemia, fatty liver, osteoporosis, breast cancer In 1994, status post chemotherapy and radiation therapy, left mastectomy, anxiety, depression, rosacea, status post tonsillectomy, who presents for initial evaluation of her tremors and balance disorder of a few years duration, perhaps worse in the past year. She has fallen a few times, thankfully no injuries reported. She has been walking with a cane outside for safety. On examination she has no telltale signs of Parkinson's, she has a mild postural upper extremity tremor with mild action component as well, no obvious resting tremor, no lateralization significantly noted. Balance is impaired. She is advised that she has several reasons to feel off balance, sometimes this is a medication side effect, sometimes clonazepam can affect balance even if it is taken in a low dose but chronically. With age, balance does tend to get worse. Her tremor may very well be a mild form of essential tremor, sometimes tremors get worse with antidepressant medication such as SSRIs  or as MRIs. The patient is on a fairly high dose of Effexor at this time. She has no rigidity, and particularly no cogwheeling was noted. She is overall reassured that her neurological exam is largely nonfocal. We talked about symptomatic treatment for tremor but sometimes medications can cause side effects including balance problems, Mysoline can be sedating and cause balance impairment. We mutually agreed to monitor her symptoms. In addition, she is advised to discuss with you a referral to physical therapy locally. She may benefit from an evaluation and perhaps a few sessions of physical therapy for  gait and balance training. She may not always hydrate well enough, she is advised to monitor her fluid intake and try to strive for 6 glasses of water per day. She is advised to use her cane for gait safety, and particularly turns slowly. I don't think we need to add any new test from my end of things at this time. I did encourage her for any updates via phone call as needed. At this juncture, we mutually agreed for a recheck in about 6 months, sooner if needed. I answered all her questions today and the patient and her husband were in agreement.   Thank you very much for allowing me to participate in the care of this nice patient. If I can be of any further assistance to you please do not hesitate to call me at [phone].  Sincerely,   Star Age, MD, PhD

## 2015-10-14 ENCOUNTER — Telehealth: Payer: Self-pay | Admitting: Diagnostic Neuroimaging

## 2015-10-15 NOTE — Telephone Encounter (Signed)
error 

## 2015-11-16 ENCOUNTER — Ambulatory Visit (HOSPITAL_COMMUNITY): Payer: Medicare Other | Attending: Pulmonary Disease

## 2015-11-16 DIAGNOSIS — M6281 Muscle weakness (generalized): Secondary | ICD-10-CM | POA: Insufficient documentation

## 2015-11-16 DIAGNOSIS — R2681 Unsteadiness on feet: Secondary | ICD-10-CM | POA: Insufficient documentation

## 2015-11-16 DIAGNOSIS — Z9181 History of falling: Secondary | ICD-10-CM | POA: Insufficient documentation

## 2015-11-16 DIAGNOSIS — R293 Abnormal posture: Secondary | ICD-10-CM | POA: Diagnosis not present

## 2015-11-16 NOTE — Patient Instructions (Signed)
Ran out of time at evaluation. Unable to teach an HEP.

## 2015-11-16 NOTE — Therapy (Signed)
[place] [address] [place], [place], [address] Phone: [phone]   Fax:  [phone]  Physical Therapy Evaluation  Patient Details  Name: Tammy Terry MRN: [id number] Date of Birth: [birth year] Referring Provider: Dr. Luan Pulling   Encounter Date: 11/16/2015      PT End of Session - 11/16/15 2055    Visit Number 1   Number of Visits 16   Date for PT Re-Evaluation 12/17/15   Authorization Type U[employer] Medicare   Authorization Time Period 11/16/15-01/16/16   Authorization - Visit Number 1   PT Start Time 1520   PT Stop Time 1600   PT Time Calculation (min) 40 min   Equipment Utilized During Treatment Gait belt   Activity Tolerance Patient tolerated treatment well;Patient limited by fatigue;Patient limited by pain   Behavior During Therapy [facility] for tasks assessed/performed      Past Medical History  Diagnosis Date  . Depression with anxiety 03/01/2012  . Hyperlipidemia 03/01/2012  . Hypertension   . Cancer ([place]) 1994    BREAST CANCER, TREATED WITH RADIATION AND CHEMOTHERAPY  . Anxiety   . H/O vitamin D deficiency   . Age-related osteoporosis without current pathological fracture   . Fatty liver   . Rosacea     Past Surgical History  Procedure Laterality Date  . Mastectomy      left breast for cancer in 1994  . Breast surgery  1994    LEFT MASTECTOMY   . Cesarean section    . Cataract extraction Left   . Capsulotomy  august 2015    of left eye  . Colonoscopy N/A 05/13/2014    Procedure: COLONOSCOPY;  Surgeon: Rogene Houston, MD;  [facility];  Service: Endoscopy;  Laterality: N/A;  930  . Tonsillectomy      There were no vitals filed for this visit.       Subjective Assessment - 11/16/15 2015    Subjective Tammy Terry is a 75yo white female who reports worsening balance over the past year, with any estimated 5 major falls, and likely other signfiicant losses of balance. The patient  most recently  sustained a fall after a dosage changes of one of her HTN medications. She describes an episode after the first new dosage that sounds conssitent with presyncopal episodes.    Pertinent History Osteopenia, Multiple Falls, BUE tremor with unclear etiology, currently seeing a pain specialist for chronic lumbar back pain.    Limitations Walking   How long can you sit comfortably? Not effected    How long can you stand comfortably? Unclear   How long can you walk comfortably? About 200-372ft estimated, requring a SPC for distances outside of the house.    Diagnostic tests Lumbar Imaging.    Patient Stated Goals The patient would like to be able to decrease falls at home and improve her confidence with higher level AMB outside of the home, including stairs to enter home.    Currently in Pain? No/denies            [facility] PT Assessment - 11/16/15 0001    Assessment   Medical Diagnosis Balance deficits with Multiple Falls    Referring Provider Dr. Luan Pulling    Onset Date/Surgical Date 11/16/14   Hand Dominance Right   Next MD Visit Not sure   Prior Therapy No prior PT; seeing a pain specialist   Precautions   Precautions Fall  multiple falls in the last year.  Restrictions   Weight Bearing Restrictions No   Balance Screen   Has the patient fallen in the past 6 months Yes   How many times? 4   Has the patient had a decrease in activity level because of a fear of falling?  Yes   Is the patient reluctant to leave their home because of a fear of falling?  No   Prior Function   Level of Independence Independent;Independent with homemaking with ambulation;Requires assistive device for independence   Vocation Retired   Associate Professor   Overall Cognitive Status Within Functional Limits for tasks assessed   Observation/Other Assessments   Focus on Therapeutic Outcomes (FOTO)  56% (44% impairment)    Observation/Other Assessments-Edema    Edema --  +1 pitting edema bilat, proximal the ankles    Sensation   Light Touch Appears Intact   Posture/Postural Control   Posture/Postural Control Postural limitations   Postural Limitations Anterior pelvic tilt;Increased thoracic kyphosis;Increased lumbar lordosis;Rounded Shoulders;Forward head;Weight shift left   AROM   Right Shoulder Flexion 145 Degrees   Left Shoulder Flexion 135 Degrees   Right Hip Extension --  not assessed   PROM   Lumbar Flexion Stuck in lordosis during forward trunk flexion   Strength   Overall Strength Comments Hor Abd/Glute Max: R; 5/5, L: 4/5  Seated, Hips at 90*   Right Elbow Flexion 5/5   Right Elbow Extension 4-/5   Left Elbow Flexion 5/5   Left Elbow Extension 3+/5   Right Hip Flexion 3+/5   Right Hip Extension --  not assessed   Right Hip ABduction 3+/5   Left Hip Flexion 4/5   Left Hip Extension --  not assessed   Left Hip ABduction 3+/5   Right/Left Knee Right   Right Knee Flexion 5/5  seated, hip at 90*   Right Knee Extension 5/5   Left Knee Flexion 5/5  seated, hip at 90*   Left Knee Extension 5/5   Right Ankle Dorsiflexion 4+/5   Right Ankle Plantar Flexion 3+/5  soleus, seated   Left Ankle Dorsiflexion 5/5   Left Ankle Plantar Flexion 4-/5  seated, hip at 90*   Thoracic   Thoracic Flexion excessive, with resting kyphosis   Thoracic Extension T-Spine remains flexed during extension activies, overhead reach   Ambulation/Gait   Ambulation Distance (Feet) 300 Feet   Assistive device None   Gait Comments Patient reporting symetrical BLE legs arching, suspect for  neurogenic or vascular claudication.    6 Minute Walk- Baseline   6 Minute Walk- Baseline no  2MWT: 250ft, 0.64m/s   Berg Balance Test   Sit to Stand Able to stand without using hands and stabilize independently   Standing Unsupported Able to stand safely 2 minutes   Sitting with Back Unsupported but Feet Supported on Floor or Stool Able to sit safely and securely 2 minutes   Stand to Sit Sits safely with minimal use of  hands   Transfers Able to transfer safely, minor use of hands   Standing Unsupported with Eyes Closed Able to stand 10 seconds safely   Standing Ubsupported with Feet Together Able to place feet together independently and stand 1 minute safely   From Standing, Reach Forward with Outstretched Arm Can reach forward >5 cm safely (2")   From Standing Position, Pick up Object from Floor Able to pick up shoe safely and easily   From Standing Position, Turn to Look Behind Over each Shoulder Looks behind one side only/other side shows  less weight shift   Turn 360 Degrees Able to turn 360 degrees safely but slowly   Standing Unsupported, Alternately Place Feet on Step/Stool Able to complete >2 steps/needs minimal assist   Standing Unsupported, One Foot in Front Able to take small step independently and hold 30 seconds   Standing on One Leg Tries to lift leg/unable to hold 3 seconds but remains standing independently   Total Score 43                   [facility] Adult PT Treatment/Exercise - 11/16/15 0001    Exercises   Exercises Lumbar;Knee/Hip;Shoulder   Lumbar Exercises: Stretches   Single Knee to Chest Stretch --  Add next visit; add to HEP    Double Knee to Chest Stretch --  Add next visit, add to HEP    Lower Trunk Rotation --  Add next visit   Lumbar Exercises: Supine   Ab Set --  Add next visit   Dead Bug --  add next visit   Straight Leg Raise --  Add next visit   Knee/Hip Exercises: Seated   Marching --  Add next visit, add to HEP   Shoulder Exercises: Standing   Extension --  RedTB, addd next visit   Theraband Level (Shoulder Extension) Level 2 (Red)   Row --  RedTB, add next time   Theraband Level (Shoulder Row) Level 2 (Red)   Shoulder Exercises: ROM/Strengthening   "W" Arms Add next visit   Shoulder Exercises: Stretch   Star Gazer Stretch --  On thoracic towel roll (add next visit)                PT Education - 11/16/15 2054    Education provided  Yes   Education Details Explained how weakness contributes to balance impairments and falls   Person(s) Educated Patient   Methods Explanation   Comprehension Verbalized understanding          PT Short Term Goals - 11/16/15 2106    PT SHORT TERM GOAL #1   Title After 2 weeks pt will demonstrate indpe in HEP for improved self efficacy in PT outcomes.    PT SHORT TERM GOAL #2   Title After 4 weeks, pt will demonstrate a loss of lumbar lordosis with forward flexion activities to improve lumbopelvic motor control during gait.    PT SHORT TERM GOAL #3   Title After 4 weeks pt will demonstrate BBT score increase to 50/56 to decrease falls risk.    PT SHORT TERM GOAL #4   Title After 8 weeks, pt will demonstrate tolerance of 4 minutes sustained walking to improve access to the community to perform IADL.            PT Long Term Goals - 11/16/15 2112    PT LONG TERM GOAL #1   Title After 7 weeks, pt will demonstrate advanced HEP for DC to promote continued progress toward goals s/p DC.    Status New   PT LONG TERM GOAL #2   Title After 8 weeks pt will demonstrate BBT score incease to 52/56 to decrease risk of falls with ADL/IADL.    PT LONG TERM GOAL #3   Title After 8 weeks pt will demonstrate 2MWT distance of >418ft to improve access of community for IADL.     PT LONG TERM GOAL #4   Title After 8 weeks, pt will demonstrate 10 meter walk test gait speed of >1.75m/s to decrease risk of  falls.                Plan - 11/16/15 2058    Clinical Impression Statement Patient demonstrating imapired balance, as seen in [facility] Test results, decreased lumbothoracic and lumbopelvic mobility, motor control, and strength, decreased BUE strength, and decreased BUE scapular motor control and mobility, all contributing to increased falls, impaired ambulation, and impaiired falls recovery.    Rehab Potential Good   Clinical Impairments Affecting Rehab Potential Chronicity   PT Frequency 2x  / week  [place] transitioning to 1x weekly after 4 weeks if finances are a burden.   PT Duration 8 weeks   PT Treatment/Interventions Gait training;Stair training;Functional mobility training;Therapeutic activities;Therapeutic exercise;Balance training;Manual techniques;Prosthetic Training   PT Next Visit Plan Teach HEP and provide a handout.    Consulted and Agree with Plan of Care Patient      Patient will benefit from skilled therapeutic intervention in order to improve the following deficits and impairments:  Abnormal gait, Decreased activity tolerance, Decreased balance, Decreased mobility, Hypomobility, Decreased strength, Decreased range of motion, Difficulty walking, Increased edema, Postural dysfunction  Visit Diagnosis: Abnormal posture - Plan: PT plan of care cert/re-cert  History of falling - Plan: PT plan of care cert/re-cert  Unsteadiness on feet - Plan: PT plan of care cert/re-cert  Muscle weakness (generalized) - Plan: PT plan of care cert/re-cert      G-Codes - [id number]    Functional Assessment Tool Used FOTO   Functional Limitation Changing and maintaining body position   Changing and Maintaining Body Position Current Status [id number]) At least 40 percent but less than 60 percent impaired, limited or restricted   Changing and Maintaining Body Position Goal Status [id number]) At least 20 percent but less than 40 percent impaired, limited or restricted       Problem List Patient Active Problem List   Diagnosis Date Noted  . Abdominal distention 02/03/2015  . Osteopenia 03/18/2013  . Essential hypertension, benign 10/22/2012  . Fatty liver 10/22/2012  . Elevated liver enzymes 10/22/2012  . Breast cancer, stage 3 ([place]) 03/01/2012  . Depression with anxiety 03/01/2012  . Hyperlipidemia 03/01/2012    9:35 PM, 11/16/2015 Etta Grandchild, PT, DPT PRN Physical Therapist at [place] # [id number] (office)     [facility] [place] [address] [place], [place], [address] Phone: [phone]   Fax:  [phone]  Name: Tammy Terry MRN: [id number] Date of Birth: Jul 13, 1940

## 2015-11-17 ENCOUNTER — Ambulatory Visit (HOSPITAL_COMMUNITY): Payer: Medicare Other

## 2015-11-17 DIAGNOSIS — R2681 Unsteadiness on feet: Secondary | ICD-10-CM

## 2015-11-17 DIAGNOSIS — R293 Abnormal posture: Secondary | ICD-10-CM | POA: Diagnosis not present

## 2015-11-17 DIAGNOSIS — M6281 Muscle weakness (generalized): Secondary | ICD-10-CM

## 2015-11-17 DIAGNOSIS — Z9181 History of falling: Secondary | ICD-10-CM

## 2015-11-17 NOTE — Patient Instructions (Signed)
Knee to Chest (Flexion)    Pull knee toward chest. Feel stretch in lower back or buttock area. Breathing deeply, Hold 10 seconds. Repeat with other knee. Repeat 10  times. Do 1-2 sessions per day.  http://gt2.exer.us/226   Copyright  VHI. All rights reserved.   Double Knee to Chest (Flexion)    Gently pull both knees toward chest. Feel stretch in lower back or buttock area. Breathing deeply, Hold 10 seconds. Repeat 5-10 times. Do 1-2 sessions per day.  http://gt2.exer.us/228   High Stepping in Place (Sitting)    Sitting, keep stomach muscles tight and alternately lift knees as high as possible. Keep torso erect. Repeat 10 times, each leg.  Copyright  VHI. All rights reserved.

## 2015-11-17 NOTE — Therapy (Signed)
[facility] [address] [place], [place], [address] Phone: [phone]   Fax:  [phone]  Physical Therapy Treatment  Patient Details  Name: Tammy Terry MRN: [id number] Date of Birth: 16-Jun-1941 Referring Provider: Dr. Luan Pulling   Encounter Date: 11/17/2015      PT End of Session - 11/17/15 1314    Visit Number 2   Number of Visits 16   Date for PT Re-Evaluation 12/17/15   Authorization Type UHC Medicare   Authorization Time Period 11/16/15-01/16/16   Authorization - Visit Number 2   Authorization - Number of Visits 10   PT Start Time [id number]   PT Stop Time 1344   PT Time Calculation (min) 32 min   Activity Tolerance Patient tolerated treatment well   Behavior During Therapy [facility] for tasks assessed/performed      Past Medical History  Diagnosis Date  . Depression with anxiety 03/01/2012  . Hyperlipidemia 03/01/2012  . Hypertension   . Cancer ([place]) 1994    BREAST CANCER, TREATED WITH RADIATION AND CHEMOTHERAPY  . Anxiety   . H/O vitamin D deficiency   . Age-related osteoporosis without current pathological fracture   . Fatty liver   . Rosacea     Past Surgical History  Procedure Laterality Date  . Mastectomy      left breast for cancer in 1994  . Breast surgery  1994    LEFT MASTECTOMY   . Cesarean section    . Cataract extraction Left   . Capsulotomy  august 2015    of left eye  . Colonoscopy N/A 05/13/2014    Procedure: COLONOSCOPY;  Surgeon: Rogene Houston, MD;  [facility];  Service: Endoscopy;  Laterality: N/A;  930  . Tonsillectomy      There were no vitals filed for this visit.      Subjective Assessment - 11/17/15 1309    Subjective Pt stated she is feeling good today just feels slow, no reports of pain today.     Pertinent History Osteopenia, Multiple Falls, BUE tremor with unclear etiology, currently seeing a pain specialist for chronic lumbar back pain.    Patient Stated Goals The patient  would like to be able to decrease falls at home and improve her confidence with higher level AMB outside of the home, including stairs to enter home.    Currently in Pain? No/denies               [facility] Adult PT Treatment/Exercise - 11/17/15 0001    Exercises   Exercises Lumbar   Lumbar Exercises: Stretches   Single Knee to Chest Stretch 5 reps;10 seconds  HEP   Double Knee to Chest Stretch 5 reps;10 seconds  HEP   Lower Trunk Rotation 5 reps;10 seconds   Lumbar Exercises: Seated   Hip Flexion on Ball Limitations 10x 5" with cueing for core activation   Lumbar Exercises: Supine   Ab Set 10 reps;3 seconds   AB Set Limitations max tactile, verbal and demonstration to activate   Bent Knee Raise 10 reps;5 seconds   Bridge 10 reps   Straight Leg Raise 5 reps   Shoulder Exercises: Standing   Extension --  time   Row --  time   Shoulder Exercises: ROM/Strengthening   "W" Arms Time   Shoulder Exercises: Stretch   Star Gazer Stretch --  Time                PT Education -  11/16/15 2054    Education provided Yes   Education Details Explained how weakness contributes to balance impairments and falls   Person(s) Educated Patient   Methods Explanation   Comprehension Verbalized understanding          PT Short Term Goals - 11/16/15 2106    PT SHORT TERM GOAL #1   Title After 2 weeks pt will demonstrate indpe in HEP for improved self efficacy in PT outcomes.    PT SHORT TERM GOAL #2   Title After 4 weeks, pt will demonstrate a loss of lumbar lordosis with forward flexion activities to improve lumbopelvic motor control during gait.    PT SHORT TERM GOAL #3   Title After 4 weeks pt will demonstrate BBT score increase to 50/56 to decrease falls risk.    PT SHORT TERM GOAL #4   Title After 8 weeks, pt will demonstrate tolerance of 4 minutes sustained walking to improve access to the community to perform IADL.            PT Long Term Goals - 11/16/15 2112    PT  LONG TERM GOAL #1   Title After 7 weeks, pt will demonstrate advanced HEP for DC to promote continued progress toward goals s/p DC.    Status New   PT LONG TERM GOAL #2   Title After 8 weeks pt will demonstrate BBT score incease to 52/56 to decrease risk of falls with ADL/IADL.    PT LONG TERM GOAL #3   Title After 8 weeks pt will demonstrate 2MWT distance of >453ft to improve access of community for IADL.     PT LONG TERM GOAL #4   Title After 8 weeks, pt will demonstrate 10 meter walk test gait speed of >1.31m/s to decrease risk of falls.                Plan - 11/17/15 1332    Clinical Impression Statement Reviewed goals, established HEP and copy of eval given to pt.  Began session instructing proper sequence with SPC and education on importance of ambulating with AD to reduce risk of falls.  Session focus on improving core activation and lumbopelvic mobiltiy, pt require maximal verbal and tactile cueiing for motor control to improve.core activation.  Pt reported discomfort lower back in supine position due to hard mat, added pillow underneath with reports of discomfort resolved.  Pt able to demonstrate and verbalize appropriate technqiue with, given HEP printout.  No reports of pain.   Rehab Potential Good   Clinical Impairments Affecting Rehab Potential Chronicity   PT Frequency 2x / week   PT Duration 8 weeks   PT Treatment/Interventions Gait training;Stair training;Functional mobility training;Therapeutic activities;Therapeutic exercise;Balance training;Manual techniques;Prosthetic Training   PT Next Visit Plan Continue with current PT POC to improve core strengthening.  Add posture strengthening and Wback next session.      Patient will benefit from skilled therapeutic intervention in order to improve the following deficits and impairments:  Abnormal gait, Decreased activity tolerance, Decreased balance, Decreased mobility, Hypomobility, Decreased strength, Decreased range of  motion, Difficulty walking, Increased edema, Postural dysfunction  Visit Diagnosis: Abnormal posture  History of falling  Unsteadiness on feet  Muscle weakness (generalized)    Problem List Patient Active Problem List   Diagnosis Date Noted  . Abdominal distention 02/03/2015  . Osteopenia 03/18/2013  . Essential hypertension, benign 10/22/2012  . Fatty liver 10/22/2012  . Elevated liver enzymes 10/22/2012  . Breast cancer, stage 3 ([facility])  03/01/2012  . Depression with anxiety 03/01/2012  . Hyperlipidemia 03/01/2012   Ihor Austin, [place]; [place]  Aldona Lento 11/17/2015, 3:32 PM  [facility] [address] [place], [place], [address] Phone: [phone]   Fax:  [phone]  Name: Tammy Terry MRN: [id number] Date of Birth: [birth year]

## 2015-11-23 ENCOUNTER — Ambulatory Visit (HOSPITAL_COMMUNITY): Payer: Medicare Other

## 2015-11-23 DIAGNOSIS — R2681 Unsteadiness on feet: Secondary | ICD-10-CM

## 2015-11-23 DIAGNOSIS — Z9181 History of falling: Secondary | ICD-10-CM

## 2015-11-23 DIAGNOSIS — M6281 Muscle weakness (generalized): Secondary | ICD-10-CM

## 2015-11-23 DIAGNOSIS — R293 Abnormal posture: Secondary | ICD-10-CM

## 2015-11-23 NOTE — Therapy (Signed)
[facility] [address] [place], [place], [address] Phone: [phone]   Fax:  [phone]  Physical Therapy Treatment  Patient Details  Name: Tammy Terry MRN: [id number] Date of Birth: Aug 09, 1940 Referring Provider: Dr. Luan Pulling   Encounter Date: 11/23/2015      PT End of Session - 11/23/15 1627    Visit Number 3   Number of Visits 16   Date for PT Re-Evaluation 12/17/15   Authorization Type U[employer] Medicare   Authorization Time Period 11/16/15-01/16/16   Authorization - Visit Number 3   Authorization - Number of Visits 10   PT Start Time [id number]   PT Stop Time 1648   PT Time Calculation (min) 38 min   Activity Tolerance Patient tolerated treatment well   Behavior During Therapy [facility] for tasks assessed/performed      Past Medical History  Diagnosis Date  . Depression with anxiety 03/01/2012  . Hyperlipidemia 03/01/2012  . Hypertension   . Cancer ([place]) 1994    BREAST CANCER, TREATED WITH RADIATION AND CHEMOTHERAPY  . Anxiety   . H/O vitamin D deficiency   . Age-related osteoporosis without current pathological fracture   . Fatty liver   . Rosacea     Past Surgical History  Procedure Laterality Date  . Mastectomy      left breast for cancer in 1994  . Breast surgery  1994    LEFT MASTECTOMY   . Cesarean section    . Cataract extraction Left   . Capsulotomy  august 2015    of left eye  . Colonoscopy N/A 05/13/2014    Procedure: COLONOSCOPY;  Surgeon: Rogene Houston, MD;  [facility];  Service: Endoscopy;  Laterality: N/A;  930  . Tonsillectomy      There were no vitals filed for this visit.      Subjective Assessment - 11/23/15 1611    Subjective Pt report she is feeling ok today. She has not started working on her exercises at home for no particular reason.    Pertinent History Osteopenia, Multiple Falls, BUE tremor with unclear etiology, currently seeing a pain specialist for chronic lumbar back pain.    Limitations Walking   How long can you sit comfortably? Not effected    How long can you stand comfortably? Unclear   How long can you walk comfortably? About 200-349ft estimated, requring a SPC for distances outside of the house.    Diagnostic tests Lumbar Imaging.    Patient Stated Goals The patient would like to be able to decrease falls at home and improve her confidence with higher level AMB outside of the home, including stairs to enter home.    Currently in Pain? Yes   Pain Score 5    Pain Location Neck   Pain Orientation --  central                         [facility] Adult PT Treatment/Exercise - 11/23/15 0001    Exercises   Exercises Lumbar   Lumbar Exercises: Stretches   Single Knee to Chest Stretch 3 reps;30 seconds  alternating bilat   Double Knee to Chest Stretch 3 reps;30 seconds  30sexc oscialation x3   Lower Trunk Rotation 60 seconds  rocking side to side x20 bilat Not tolerated well, due to tailbone pain.    Lumbar Exercises: Supine   Dead Bug 20 reps  Alternating  Unable to get to for time.  Bridge 10 reps  2x10 c thowel roll under T6   Straight Leg Raise 10 reps   Knee/Hip Exercises: Seated   Marching 2 sets;10 reps   Shoulder Exercises: Standing   Extension 10 reps;Strengthening;Theraband, requires help for balance   Theraband Level (Shoulder Extension) Level 2 (Red)   Row 10 reps;Strengthening  2 sets, requires help for balance   Theraband Level (Shoulder Row) Level 2 (Red)             Balance Exercises - 11/23/15 1640    Balance Exercises: Standing   Standing Eyes Opened Narrow base of support (BOS);Foam/compliant surface;3 reps;30 secs           PT Education - 11/23/15 1625    Education provided Yes   Education Details explained how she will need to improve HEP compliance for quicker progress toward goals.    Person(s) Educated Patient   Methods Explanation   Comprehension Verbalized understanding          PT  Short Term Goals - 11/16/15 2106    PT SHORT TERM GOAL #1   Title After 2 weeks pt will demonstrate indpe in HEP for improved self efficacy in PT outcomes.    PT SHORT TERM GOAL #2   Title After 4 weeks, pt will demonstrate a loss of lumbar lordosis with forward flexion activities to improve lumbopelvic motor control during gait.    PT SHORT TERM GOAL #3   Title After 4 weeks pt will demonstrate BBT score increase to 50/56 to decrease falls risk.    PT SHORT TERM GOAL #4   Title After 8 weeks, pt will demonstrate tolerance of 4 minutes sustained walking to improve access to the community to perform IADL.            PT Long Term Goals - 11/16/15 2112    PT LONG TERM GOAL #1   Title After 7 weeks, pt will demonstrate advanced HEP for DC to promote continued progress toward goals s/p DC.    Status New   PT LONG TERM GOAL #2   Title After 8 weeks pt will demonstrate BBT score incease to 52/56 to decrease risk of falls with ADL/IADL.    PT LONG TERM GOAL #3   Title After 8 weeks pt will demonstrate 2MWT distance of >489ft to improve access of community for IADL.     PT LONG TERM GOAL #4   Title After 8 weeks, pt will demonstrate 10 meter walk test gait speed of >1.31m/s to decrease risk of falls.                Plan - 11/23/15 1627    Clinical Impression Statement Reviewed exercises again. Patient requires heavy verbal and tactil cues for exercises today, most are unfamiliar despite having done them a few tmes. She is bit distracted during the session, not able to attend to the task of keep count of her reps. She is tolerating more activity today than at last session which demonstrates some progress toward her goals.  Pt was able to add new exercises for shoulder strength and posture today.  PTt also started some balance actvities today, which were more difficult to perform than anticipated, but patient remains motivated and optimistic.    Rehab Potential Good   Clinical Impairments  Affecting Rehab Potential Chronicity   PT Frequency 2x / week   PT Duration 8 weeks   PT Treatment/Interventions Gait training;Stair training;Functional mobility training;Therapeutic activities;Therapeutic exercise;Balance training;Manual techniques;Prosthetic Training  PT Next Visit Plan Progress shoulder strength and lumbar flexion.    Consulted and Agree with Plan of Care Patient      Patient will benefit from skilled therapeutic intervention in order to improve the following deficits and impairments:  Abnormal gait, Decreased activity tolerance, Decreased balance, Decreased mobility, Hypomobility, Decreased strength, Decreased range of motion, Difficulty walking, Increased edema, Postural dysfunction  Visit Diagnosis: Abnormal posture  History of falling  Unsteadiness on feet  Muscle weakness (generalized)     Problem List Patient Active Problem List   Diagnosis Date Noted  . Abdominal distention 02/03/2015  . Osteopenia 03/18/2013  . Essential hypertension, benign 10/22/2012  . Fatty liver 10/22/2012  . Elevated liver enzymes 10/22/2012  . Breast cancer, stage 3 ([place]) 03/01/2012  . Depression with anxiety 03/01/2012  . Hyperlipidemia 03/01/2012   4:50 PM, 11/23/2015 Etta Grandchild, PT, DPT PRN Physical Therapist at [facility] License # [id number] (office)     [facility] [address] [place], [place], [address] Phone: [phone]   Fax:  [phone]  Name: Tammy Terry MRN: [id number] Date of Birth: September 27, 1940

## 2015-11-26 ENCOUNTER — Ambulatory Visit (HOSPITAL_COMMUNITY): Payer: Medicare Other | Attending: Pulmonary Disease

## 2015-11-26 DIAGNOSIS — Z9181 History of falling: Secondary | ICD-10-CM | POA: Diagnosis present

## 2015-11-26 DIAGNOSIS — R2681 Unsteadiness on feet: Secondary | ICD-10-CM

## 2015-11-26 DIAGNOSIS — M6281 Muscle weakness (generalized): Secondary | ICD-10-CM | POA: Diagnosis present

## 2015-11-26 DIAGNOSIS — R293 Abnormal posture: Secondary | ICD-10-CM

## 2015-11-26 NOTE — Therapy (Signed)
[facility] [address] [place], [place], [address] Phone: [phone]   Fax:  [phone]  Physical Therapy Treatment  Patient Details  Name: Tammy Terry MRN: [id number] Date of Birth: 08/18/1940 Referring Provider: Dr. Luan Pulling   Encounter Date: 11/26/2015      PT End of Session - 11/26/15 1623    Visit Number 4   Number of Visits 16   Date for PT Re-Evaluation 12/17/15   Authorization Type UHC Medicare   Authorization Time Period 11/16/15-01/16/16   Authorization - Visit Number 4   Authorization - Number of Visits 10   PT Start Time [id number]   PT Stop Time 1655   PT Time Calculation (min) 51 min   Equipment Utilized During Treatment Gait belt   Activity Tolerance Patient tolerated treatment well   Behavior During Therapy [facility] for tasks assessed/performed      Past Medical History  Diagnosis Date  . Depression with anxiety 03/01/2012  . Hyperlipidemia 03/01/2012  . Hypertension   . Cancer ([place]) 1994    BREAST CANCER, TREATED WITH RADIATION AND CHEMOTHERAPY  . Anxiety   . H/O vitamin D deficiency   . Age-related osteoporosis without current pathological fracture   . Fatty liver   . Rosacea     Past Surgical History  Procedure Laterality Date  . Mastectomy      left breast for cancer in 1994  . Breast surgery  1994    LEFT MASTECTOMY   . Cesarean section    . Cataract extraction Left   . Capsulotomy  august 2015    of left eye  . Colonoscopy N/A 05/13/2014    Procedure: COLONOSCOPY;  Surgeon: Rogene Houston, MD;  [facility];  Service: Endoscopy;  Laterality: N/A;  930  . Tonsillectomy      There were no vitals filed for this visit.      Subjective Assessment - 11/26/15 1603    Subjective Pt stated she is not feeling  good today, has been low on anxiety medication, has cut back doseage to and is out of meds today.  Has been to pharmacy and unable to get refill.  No reports of pain today, has began some  of the HEP.  Reports discomfort on hard mat laying on back in dept. Has began HEP some at home this morning.   Pertinent History Osteopenia, Multiple Falls, BUE tremor with unclear etiology, currently seeing a pain specialist for chronic lumbar back pain.    Patient Stated Goals The patient would like to be able to decrease falls at home and improve her confidence with higher level AMB outside of the home, including stairs to enter home.    Currently in Pain? No/denies           [facility] Adult PT Treatment/Exercise - 11/26/15 0001    Lumbar Exercises: Seated   Hip Flexion on Ball Limitations 10x 5" with cueing for core activation on static table not ball   Shoulder Exercises: Seated   Flexion 10 reps   Flexion Limitations arm behind head wiht ER to improve posture   Other Seated Exercises Scapular retraction with multimodal cueing for correct technique   Other Seated Exercises Seated forward flexed arms to floor to improve lumbar flexion   Shoulder Exercises: Standing   Extension Both;10 reps;Theraband   Theraband Level (Shoulder Extension) Level 2 (Red)   Extension Limitations 2x 10 with cueing for form   Row Strengthening;10 reps;Theraband   Theraband  Level (Shoulder Row) Level 2 (Red)   Row Limitations 2x 10 with cueing for form   Retraction Strengthening;Both;10 reps;Theraband   Theraband Level (Shoulder Retraction) Level 2 (Red)   Retraction Limitations 2x 10 with cueing for form   Shoulder Exercises: ROM/Strengthening   "W" Arms 10x             Balance Exercises - 11/26/15 1708    Balance Exercises: Standing   Standing Eyes Opened Narrow base of support (BOS);Foam/compliant surface;3 reps;30 secs   Tandem Stance Eyes open;Foam/compliant surface;3 reps;30 secs   Rockerboard Lateral;UE support  2 minutes with cueing for form             PT Short Term Goals - 11/16/15 2106    PT SHORT TERM GOAL #1   Title After 2 weeks pt will demonstrate indpe in HEP for improved  self efficacy in PT outcomes.    PT SHORT TERM GOAL #2   Title After 4 weeks, pt will demonstrate a loss of lumbar lordosis with forward flexion activities to improve lumbopelvic motor control during gait.    PT SHORT TERM GOAL #3   Title After 4 weeks pt will demonstrate BBT score increase to 50/56 to decrease falls risk.    PT SHORT TERM GOAL #4   Title After 8 weeks, pt will demonstrate tolerance of 4 minutes sustained walking to improve access to the community to perform IADL.            PT Long Term Goals - 11/16/15 2112    PT LONG TERM GOAL #1   Title After 7 weeks, pt will demonstrate advanced HEP for DC to promote continued progress toward goals s/p DC.    Status New   PT LONG TERM GOAL #2   Title After 8 weeks pt will demonstrate BBT score incease to 52/56 to decrease risk of falls with ADL/IADL.    PT LONG TERM GOAL #3   Title After 8 weeks pt will demonstrate 2MWT distance of >498ft to improve access of community for IADL.     PT LONG TERM GOAL #4   Title After 8 weeks, pt will demonstrate 10 meter walk test gait speed of >1.50m/s to decrease risk of falls.                Plan - 11/26/15 1628    Clinical Impression Statement Session focus on improving posture awareness/strengtheing, improving lumbar mobilty and balance training.  Pt continues to required moderate verbal, tacitle and visual demonstration to complete all exercises correclty.  Pt stated increased discomfort on lower back with supine position and requested not to complete any supine exercises today.  Added sitting reaching arms to floor to improve lumbar flexion and shoulder over head with ER to improve scapular retraction.  No reports of pain through session.  Min A required with balance training on dynamic surface to reduce risk of falls with education on importance of proper posture to assist with balance.  No reports of increased pain through session.     Rehab Potential Good   Clinical Impairments  Affecting Rehab Potential Chronicity   PT Frequency 2x / week   PT Duration 8 weeks   PT Treatment/Interventions Gait training;Stair training;Functional mobility training;Therapeutic activities;Therapeutic exercise;Balance training;Manual techniques;Prosthetic Training   PT Next Visit Plan Progress shoulder strength, lumbar flexion and balance training per PT POC.        Patient will benefit from skilled therapeutic intervention in order to improve the following deficits  and impairments:  Abnormal gait, Decreased activity tolerance, Decreased balance, Decreased mobility, Hypomobility, Decreased strength, Decreased range of motion, Difficulty walking, Increased edema, Postural dysfunction  Visit Diagnosis: Abnormal posture  History of falling  Unsteadiness on feet  Muscle weakness (generalized)     Problem List Patient Active Problem List   Diagnosis Date Noted  . Abdominal distention 02/03/2015  . Osteopenia 03/18/2013  . Essential hypertension, benign 10/22/2012  . Fatty liver 10/22/2012  . Elevated liver enzymes 10/22/2012  . Breast cancer, stage 3 ([place]) 03/01/2012  . Depression with anxiety 03/01/2012  . Hyperlipidemia 03/01/2012   Ihor Austin, [place]; [place]  Aldona Lento 11/26/2015, 5:12 PM  [facility] [address] [place], [place], [address] Phone: [phone]   Fax:  [phone]  Name: Tammy Terry MRN: [id number] Date of Birth: [birth year]

## 2015-11-30 ENCOUNTER — Ambulatory Visit (HOSPITAL_COMMUNITY): Payer: Medicare Other | Admitting: Physical Therapy

## 2015-11-30 DIAGNOSIS — R2681 Unsteadiness on feet: Secondary | ICD-10-CM

## 2015-11-30 DIAGNOSIS — M6281 Muscle weakness (generalized): Secondary | ICD-10-CM

## 2015-11-30 DIAGNOSIS — R293 Abnormal posture: Secondary | ICD-10-CM | POA: Diagnosis not present

## 2015-11-30 DIAGNOSIS — Z9181 History of falling: Secondary | ICD-10-CM

## 2015-11-30 NOTE — Therapy (Signed)
[facility] [address] [place], [place], [address] Phone: [phone]   Fax:  [phone]  Physical Therapy Treatment  Patient Details  Name: Tammy Terry MRN: [id number] Date of Birth: 1941-06-15 Referring Provider: Dr. Luan Pulling   Encounter Date: 11/30/2015      PT End of Session - 11/30/15 1519    Visit Number 5   Number of Visits 16   Date for PT Re-Evaluation 12/17/15   Authorization Type [employer]HC Medicare   Authorization Time Period 11/16/15-01/16/16   Authorization - Visit Number 5   Authorization - Number of Visits 10   PT Start Time 1440  pt arrived late to appointment   PT Stop Time 1513   PT Time Calculation (min) 33 min   Equipment Utilized During Treatment Gait belt   Activity Tolerance Patient tolerated treatment well   Behavior During Therapy [facility] for tasks assessed/performed      Past Medical History  Diagnosis Date  . Depression with anxiety 03/01/2012  . Hyperlipidemia 03/01/2012  . Hypertension   . Cancer ([place]) 1994    BREAST CANCER, TREATED WITH RADIATION AND CHEMOTHERAPY  . Anxiety   . H/O vitamin D deficiency   . Age-related osteoporosis without current pathological fracture   . Fatty liver   . Rosacea     Past Surgical History  Procedure Laterality Date  . Mastectomy      left breast for cancer in 1994  . Breast surgery  1994    LEFT MASTECTOMY   . Cesarean section    . Cataract extraction Left   . Capsulotomy  august 2015    of left eye  . Colonoscopy N/A 05/13/2014    Procedure: COLONOSCOPY;  Surgeon: Rogene Houston, MD;  [facility];  Service: Endoscopy;  Laterality: N/A;  930  . Tonsillectomy      There were no vitals filed for this visit.      Subjective Assessment - 11/30/15 1445    Subjective Pt states she is sleepy this evening. She has been doing her HEP without any issues.   Pertinent History Osteopenia, Multiple Falls, BUE tremor with unclear etiology, currently seeing a  pain specialist for chronic lumbar back pain.    Patient Stated Goals The patient would like to be able to decrease falls at home and improve her confidence with higher level AMB outside of the home, including stairs to enter home.    Currently in Pain? No/denies                         [facility] Adult PT Treatment/Exercise - 11/30/15 0001    Knee/Hip Exercises: Standing   Functional Squat 2 sets;10 reps   Other Standing Knee Exercises heel/toe raises in // bars x20 reps   Other Standing Knee Exercises standing marching with 5# ankle weights x20 with step onto 6" box             Balance Exercises - 11/30/15 1455    Balance Exercises: Standing   Standing Eyes Opened Narrow base of support (BOS);Foam/compliant surface;2 reps;30 secs   Tandem Stance Eyes open;Eyes closed;Foam/compliant surface;Intermittent upper extremity support;3 reps;20 secs  max 5 sec on foam,EO   Rockerboard Anterior/posterior;Lateral;EO;UE support  x2 min each direction           PT Education - 11/30/15 1516    Education provided Yes   Education Details discussed importance of LE strength for improved balance; reviewed  HEP   Person(s) Educated Patient   Methods Explanation   Comprehension Verbalized understanding          PT Short Term Goals - 11/16/15 2106    PT SHORT TERM GOAL #1   Title After 2 weeks pt will demonstrate indpe in HEP for improved self efficacy in PT outcomes.    PT SHORT TERM GOAL #2   Title After 4 weeks, pt will demonstrate a loss of lumbar lordosis with forward flexion activities to improve lumbopelvic motor control during gait.    PT SHORT TERM GOAL #3   Title After 4 weeks pt will demonstrate BBT score increase to 50/56 to decrease falls risk.    PT SHORT TERM GOAL #4   Title After 8 weeks, pt will demonstrate tolerance of 4 minutes sustained walking to improve access to the community to perform IADL.            PT Long Term Goals - 11/16/15 2112    PT  LONG TERM GOAL #1   Title After 7 weeks, pt will demonstrate advanced HEP for DC to promote continued progress toward goals s/p DC.    Status New   PT LONG TERM GOAL #2   Title After 8 weeks pt will demonstrate BBT score incease to 52/56 to decrease risk of falls with ADL/IADL.    PT LONG TERM GOAL #3   Title After 8 weeks pt will demonstrate 2MWT distance of >444ft to improve access of community for IADL.     PT LONG TERM GOAL #4   Title After 8 weeks, pt will demonstrate 10 meter walk test gait speed of >1.7m/s to decrease risk of falls.                Plan - 11/30/15 1607    Clinical Impression Statement Pt arrived late today, so session focused primarily on balance and some functional strengthening. She demonstrates increased difficulty and postural sway with posterior lean during balance activity on foam pad. Therapist reviewed HEP with no changes made at this point. Will continue with current POC.     Rehab Potential Good   Clinical Impairments Affecting Rehab Potential Chronicity   PT Frequency 2x / week   PT Duration 8 weeks   PT Treatment/Interventions Gait training;Stair training;Functional mobility training;Therapeutic activities;Therapeutic exercise;Balance training;Manual techniques;Prosthetic Training   PT Next Visit Plan Progress shoulder strength, lumbar flexion and balance training per PT POC.     PT Home Exercise Plan No changes this visit   Consulted and Agree with Plan of Care Patient      Patient will benefit from skilled therapeutic intervention in order to improve the following deficits and impairments:  Abnormal gait, Decreased activity tolerance, Decreased balance, Decreased mobility, Hypomobility, Decreased strength, Decreased range of motion, Difficulty walking, Increased edema, Postural dysfunction  Visit Diagnosis: Abnormal posture  History of falling  Unsteadiness on feet  Muscle weakness (generalized)     Problem List Patient Active  Problem List   Diagnosis Date Noted  . Abdominal distention 02/03/2015  . Osteopenia 03/18/2013  . Essential hypertension, benign 10/22/2012  . Fatty liver 10/22/2012  . Elevated liver enzymes 10/22/2012  . Breast cancer, stage 3 ([place]) 03/01/2012  . Depression with anxiety 03/01/2012  . Hyperlipidemia 03/01/2012   4:12 PM,11/30/2015 Elly Modena PT, DPT [facility] [address] [place], [place], [address] Phone: [phone]   Fax:  [phone]  Name: Lloyd  CYRIL CALENDER MRN: [id number] Date of Birth: Feb 27, 1941

## 2015-12-03 ENCOUNTER — Ambulatory Visit (HOSPITAL_COMMUNITY): Payer: Medicare Other

## 2015-12-03 DIAGNOSIS — Z9181 History of falling: Secondary | ICD-10-CM

## 2015-12-03 DIAGNOSIS — M6281 Muscle weakness (generalized): Secondary | ICD-10-CM

## 2015-12-03 DIAGNOSIS — R2681 Unsteadiness on feet: Secondary | ICD-10-CM

## 2015-12-03 DIAGNOSIS — R293 Abnormal posture: Secondary | ICD-10-CM

## 2015-12-03 NOTE — Patient Instructions (Signed)
Tandem Stance    Right foot in front of left, heel touching toe both feet "straight ahead". Stand on Foot Triangle of Support with both feet. Balance in this position 30 seconds. Do with left foot in front of right.  Copyright  VHI. All rights reserved.   

## 2015-12-03 NOTE — Therapy (Signed)
[facility] [address] [place], [place], [address] Phone: [phone]   Fax:  [phone]  Physical Therapy Treatment  Patient Details  Name: Tammy Terry MRN: [id number] Date of Birth: [birth year] Referring Provider: Dr. Luan Pulling   Encounter Date: 12/03/2015      PT End of Session - 12/03/15 1200    Visit Number 6   Number of Visits 16   Date for PT Re-Evaluation 12/17/15   Authorization Type [employer] Medicare   Authorization Time Period 11/16/15-01/16/16   Authorization - Visit Number 6   Authorization - Number of Visits 10   PT Start Time 1128  Pt late for apt   PT Stop Time 1203   PT Time Calculation (min) 35 min   Equipment Utilized During Treatment Gait belt   Activity Tolerance Patient tolerated treatment well   Behavior During Therapy [facility] for tasks assessed/performed      Past Medical History  Diagnosis Date  . Depression with anxiety 03/01/2012  . Hyperlipidemia 03/01/2012  . Hypertension   . Cancer ([place]) 1994    BREAST CANCER, TREATED WITH RADIATION AND CHEMOTHERAPY  . Anxiety   . H/O vitamin D deficiency   . Age-related osteoporosis without current pathological fracture   . Fatty liver   . Rosacea     Past Surgical History  Procedure Laterality Date  . Mastectomy      left breast for cancer in 1994  . Breast surgery  1994    LEFT MASTECTOMY   . Cesarean section    . Cataract extraction Left   . Capsulotomy  august 2015    of left eye  . Colonoscopy N/A 05/13/2014    Procedure: COLONOSCOPY;  Surgeon: Rogene Houston, MD;  [facility];  Service: Endoscopy;  Laterality: N/A;  930  . Tonsillectomy      There were no vitals filed for this visit.      Subjective Assessment - 12/03/15 1129    Subjective No reports of pain today.  Reports completeing HEP, feels she should be doing more at home.     Pertinent History Osteopenia, Multiple Falls, BUE tremor with unclear etiology, currently seeing a pain  specialist for chronic lumbar back pain.    Patient Stated Goals The patient would like to be able to decrease falls at home and improve her confidence with higher level AMB outside of the home, including stairs to enter home.    Currently in Pain? No/denies              [facility] Adult PT Treatment/Exercise - 12/03/15 0001    Knee/Hip Exercises: Standing   Functional Squat 2 sets;10 reps   Shoulder Exercises: Seated   Flexion 10 reps   Flexion Limitations theraball forward flexion    Shoulder Exercises: Standing   Extension Both;10 reps;Theraband   Theraband Level (Shoulder Extension) Level 2 (Red)   Extension Limitations 2x 10 with cueing for form   Row Strengthening;10 reps;Theraband   Theraband Level (Shoulder Row) Level 2 (Red)   Row Limitations 2x 10 with cueing for form   Retraction Strengthening;Both;10 reps;Theraband   Theraband Level (Shoulder Retraction) Level 2 (Red)   Retraction Limitations 2x 10 with cueing for form   Shoulder Exercises: Therapy Ball   Flexion 10 reps   Flexion Limitations for lumbar flexion             Balance Exercises - 12/03/15 1152    Balance Exercises: Standing   Standing  Eyes Opened Narrow base of support (BOS);Foam/compliant surface;2 reps;30 secs   Tandem Stance Eyes open;Eyes closed;Foam/compliant surface;Intermittent upper extremity support;3 reps;20 secs   Rockerboard Anterior/posterior;Lateral;EO;UE support   Marching Limitations 10x 3" no HHA             PT Short Term Goals - 11/16/15 2106    PT SHORT TERM GOAL #1   Title After 2 weeks pt will demonstrate indpe in HEP for improved self efficacy in PT outcomes.    PT SHORT TERM GOAL #2   Title After 4 weeks, pt will demonstrate a loss of lumbar lordosis with forward flexion activities to improve lumbopelvic motor control during gait.    PT SHORT TERM GOAL #3   Title After 4 weeks pt will demonstrate BBT score increase to 50/56 to decrease falls risk.    PT SHORT TERM  GOAL #4   Title After 8 weeks, pt will demonstrate tolerance of 4 minutes sustained walking to improve access to the community to perform IADL.            PT Long Term Goals - 11/16/15 2112    PT LONG TERM GOAL #1   Title After 7 weeks, pt will demonstrate advanced HEP for DC to promote continued progress toward goals s/p DC.    Status New   PT LONG TERM GOAL #2   Title After 8 weeks pt will demonstrate BBT score incease to 52/56 to decrease risk of falls with ADL/IADL.    PT LONG TERM GOAL #3   Title After 8 weeks pt will demonstrate 2MWT distance of >461ft to improve access of community for IADL.     PT LONG TERM GOAL #4   Title After 8 weeks, pt will demonstrate 10 meter walk test gait speed of >1.58m/s to decrease risk of falls.                Plan - 12/03/15 1251    Clinical Impression Statement Pt late for apt today, unable to complete full POC.  Session focus on improving balance training, functional strengthenig and posture education/strengthenng.  Min A required with balance training to reduce risk of fall on dynamic surface and moderate verbal and tactile cueing to improve posture with balance activities.  Therapist faciilitation required through session for proper form and technique with majority of exercises.  No reports of increased pain through session.  Did add static tandem stance to HEP, advised to do near countertop for UE A as needed to reduce risk of fall.  Pt able to demonstrate and verbalize safe mechanics.   Rehab Potential Good   Clinical Impairments Affecting Rehab Potential Chronicity   PT Frequency 2x / week   PT Treatment/Interventions Gait training;Stair training;Functional mobility training;Therapeutic activities;Therapeutic exercise;Balance training;Manual techniques;Prosthetic Training   PT Next Visit Plan Progress shoulder strength, lumbar flexion and balance training per PT POC.     PT Home Exercise Plan Added tandem stance to HEP      Patient  will benefit from skilled therapeutic intervention in order to improve the following deficits and impairments:  Abnormal gait, Decreased activity tolerance, Decreased balance, Decreased mobility, Hypomobility, Decreased strength, Decreased range of motion, Difficulty walking, Increased edema, Postural dysfunction  Visit Diagnosis: Abnormal posture  History of falling  Unsteadiness on feet  Muscle weakness (generalized)     Problem List Patient Active Problem List   Diagnosis Date Noted  . Abdominal distention 02/03/2015  . Osteopenia 03/18/2013  . Essential hypertension, benign 10/22/2012  .  Fatty liver 10/22/2012  . Elevated liver enzymes 10/22/2012  . Breast cancer, stage 3 ([place]) 03/01/2012  . Depression with anxiety 03/01/2012  . Hyperlipidemia 03/01/2012   Ihor Austin, [place]; [place]  Aldona Lento 12/03/2015, 12:59 PM  [facility] [address] [place], [place], [address] Phone: [phone]   Fax:  [phone]  Name: ABINAYA RAMPINO MRN: [id number] Date of Birth: 1940/08/25

## 2015-12-07 ENCOUNTER — Ambulatory Visit (HOSPITAL_COMMUNITY): Payer: Medicare Other | Admitting: Physical Therapy

## 2015-12-07 DIAGNOSIS — R293 Abnormal posture: Secondary | ICD-10-CM

## 2015-12-07 DIAGNOSIS — Z9181 History of falling: Secondary | ICD-10-CM

## 2015-12-07 DIAGNOSIS — R2681 Unsteadiness on feet: Secondary | ICD-10-CM

## 2015-12-07 DIAGNOSIS — M6281 Muscle weakness (generalized): Secondary | ICD-10-CM

## 2015-12-07 NOTE — Therapy (Signed)
[facility] [address] [place], [place], [address] Phone: [phone]   Fax:  [phone]  Physical Therapy Treatment  Patient Details  Name: Tammy Terry MRN: [id number] Date of Birth: 1941/01/21 Referring Provider: Dr. Luan Pulling   Encounter Date: 12/07/2015      PT End of Session - 12/07/15 1559    Visit Number 7   Number of Visits 16   Date for PT Re-Evaluation 12/17/15   Authorization Type UHC Medicare   Authorization Time Period 11/16/15-01/16/16   Authorization - Visit Number 6   Authorization - Number of Visits 10   PT Start Time [id number]  Pt late for apt   PT Stop Time 1558   PT Time Calculation (min) 28 min   Equipment Utilized During Treatment Gait belt   Activity Tolerance Patient tolerated treatment well   Behavior During Therapy [facility] for tasks assessed/performed      Past Medical History  Diagnosis Date  . Depression with anxiety 03/01/2012  . Hyperlipidemia 03/01/2012  . Hypertension   . Cancer ([place]) 1994    BREAST CANCER, TREATED WITH RADIATION AND CHEMOTHERAPY  . Anxiety   . H/O vitamin D deficiency   . Age-related osteoporosis without current pathological fracture   . Fatty liver   . Rosacea     Past Surgical History  Procedure Laterality Date  . Mastectomy      left breast for cancer in 1994  . Breast surgery  1994    LEFT MASTECTOMY   . Cesarean section    . Cataract extraction Left   . Capsulotomy  august 2015    of left eye  . Colonoscopy N/A 05/13/2014    Procedure: COLONOSCOPY;  Surgeon: Rogene Houston, MD;  [facility];  Service: Endoscopy;  Laterality: N/A;  930  . Tonsillectomy      There were no vitals filed for this visit.      Subjective Assessment - 12/07/15 1531    Subjective Pt states she is a little sore today in her bottom after sitting in a "bad" chair. She is just feeling overall exhausted today and apologizes for being late.    Pertinent History Osteopenia, Multiple  Falls, BUE tremor with unclear etiology, currently seeing a pain specialist for chronic lumbar back pain.    Patient Stated Goals The patient would like to be able to decrease falls at home and improve her confidence with higher level AMB outside of the home, including stairs to enter home.    Currently in Pain? No/denies                              Balance Exercises - 12/07/15 1532    Balance Exercises: Standing   Standing Eyes Opened Narrow base of support (BOS);Foam/compliant surface;2 reps;30 secs  ~20 sec each   Tandem Stance Eyes open;Eyes closed;3 reps;20 secs  30 sec EO/EC on firm surface; increased difficulty RLE forwa   SLS Eyes open;2 reps  L: 7 sec max, R: 5 sec max; x1 trial each with bolster prop   Tandem Gait 2 reps;Forward   Retro Gait 2 reps   Sidestepping 2 reps           PT Education - 12/07/15 1653    Education provided Yes   Education Details importance of arriving on time to get the most out of sessions   Person(s) Educated Patient   Methods Explanation  Comprehension Verbalized understanding          PT Short Term Goals - 11/16/15 2106    PT SHORT TERM GOAL #1   Title After 2 weeks pt will demonstrate indpe in HEP for improved self efficacy in PT outcomes.    PT SHORT TERM GOAL #2   Title After 4 weeks, pt will demonstrate a loss of lumbar lordosis with forward flexion activities to improve lumbopelvic motor control during gait.    PT SHORT TERM GOAL #3   Title After 4 weeks pt will demonstrate BBT score increase to 50/56 to decrease falls risk.    PT SHORT TERM GOAL #4   Title After 8 weeks, pt will demonstrate tolerance of 4 minutes sustained walking to improve access to the community to perform IADL.            PT Long Term Goals - 11/16/15 2112    PT LONG TERM GOAL #1   Title After 7 weeks, pt will demonstrate advanced HEP for DC to promote continued progress toward goals s/p DC.    Status New   PT LONG TERM GOAL  #2   Title After 8 weeks pt will demonstrate BBT score incease to 52/56 to decrease risk of falls with ADL/IADL.    PT LONG TERM GOAL #3   Title After 8 weeks pt will demonstrate 2MWT distance of >414ft to improve access of community for IADL.     PT LONG TERM GOAL #4   Title After 8 weeks, pt will demonstrate 10 meter walk test gait speed of >1.30m/s to decrease risk of falls.                Plan - 12/07/15 1559    Clinical Impression Statement Today's session focused on more static and dynamic balance due to time constraints. Pt demonstrating improved static balance in tandem and narrow base of support stances, however she has increased difficulty with SLS which is also evident during tandem walking. Will continue with current POC.   Rehab Potential Good   Clinical Impairments Affecting Rehab Potential Chronicity   PT Frequency 2x / week   PT Treatment/Interventions Gait training;Stair training;Functional mobility training;Therapeutic activities;Therapeutic exercise;Balance training;Manual techniques;Prosthetic Training   PT Next Visit Plan Progress shoulder strength, lumbar flexion and balance training per PT POC.     PT Home Exercise Plan Added tandem stance to HEP   Consulted and Agree with Plan of Care Patient      Patient will benefit from skilled therapeutic intervention in order to improve the following deficits and impairments:  Abnormal gait, Decreased activity tolerance, Decreased balance, Decreased mobility, Hypomobility, Decreased strength, Decreased range of motion, Difficulty walking, Increased edema, Postural dysfunction  Visit Diagnosis: Abnormal posture  History of falling  Muscle weakness (generalized)  Unsteadiness on feet     Problem List Patient Active Problem List   Diagnosis Date Noted  . Abdominal distention 02/03/2015  . Osteopenia 03/18/2013  . Essential hypertension, benign 10/22/2012  . Fatty liver 10/22/2012  . Elevated liver enzymes  10/22/2012  . Breast cancer, stage 3 ([employer]) 03/01/2012  . Depression with anxiety 03/01/2012  . Hyperlipidemia 03/01/2012    4:57 PM,12/07/2015 Elly Modena PT, DPT [facility] [address] [place], [place], [address] Phone: [phone]   Fax:  [phone]  Name: JAVAN TARABOCCHIA MRN: [id number] Date of Birth: 1940-10-28

## 2015-12-10 ENCOUNTER — Telehealth (HOSPITAL_COMMUNITY): Payer: Self-pay

## 2015-12-10 ENCOUNTER — Ambulatory Visit (HOSPITAL_COMMUNITY): Payer: Medicare Other | Admitting: Physical Therapy

## 2015-12-10 NOTE — Telephone Encounter (Signed)
Patient not feeing well.

## 2015-12-13 ENCOUNTER — Telehealth (HOSPITAL_COMMUNITY): Payer: Self-pay | Admitting: Physical Therapy

## 2015-12-13 ENCOUNTER — Ambulatory Visit (HOSPITAL_COMMUNITY): Payer: Medicare Other | Admitting: Physical Therapy

## 2015-12-13 NOTE — Telephone Encounter (Signed)
Patient l/m . She had sharpe pain on left side of buttocks and can not come in today, she plans to come in [place]. NF

## 2015-12-15 ENCOUNTER — Ambulatory Visit (HOSPITAL_COMMUNITY): Payer: Medicare Other

## 2015-12-15 DIAGNOSIS — M6281 Muscle weakness (generalized): Secondary | ICD-10-CM

## 2015-12-15 DIAGNOSIS — R293 Abnormal posture: Secondary | ICD-10-CM | POA: Diagnosis not present

## 2015-12-15 DIAGNOSIS — Z9181 History of falling: Secondary | ICD-10-CM

## 2015-12-15 DIAGNOSIS — R2681 Unsteadiness on feet: Secondary | ICD-10-CM
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
## 2015-12-20 ENCOUNTER — Ambulatory Visit (HOSPITAL_COMMUNITY): Payer: Medicare Other | Admitting: Physical Therapy

## 2015-12-20 DIAGNOSIS — R293 Abnormal posture: Secondary | ICD-10-CM | POA: Diagnosis not present

## 2015-12-20 DIAGNOSIS — Z9181 History of falling: Secondary | ICD-10-CM

## 2015-12-20 DIAGNOSIS — R2681 Unsteadiness on feet: Secondary | ICD-10-CM

## 2015-12-20 DIAGNOSIS — M6281 Muscle weakness (generalized): Secondary | ICD-10-CM

## 2015-12-20 LAB — FECAL OCCULT BLOOD, GUAIAC: Fecal Occult Blood: NEGATIVE

## 2015-12-20 NOTE — Therapy (Signed)
[place], [place], [address] Phone: [phone]   Fax:  [phone]  Physical Therapy Treatment/Reassessment  Patient Details  Name: Tammy Terry MRN: [id number] Date of Birth: [birth year] Referring Provider: Dr. Luan Pulling  Encounter Date: 12/20/2015      PT End of Session - 12/20/15 1511    Visit Number 9   Number of Visits 16   Date for PT Re-Evaluation 12/17/15   Authorization Type UHC Medicare   Authorization Time Period 11/16/15-01/16/16   Authorization - Visit Number 8   Authorization - Number of Visits 10   PT Start Time 3716  pt arrived late   PT Stop Time 1515   PT Time Calculation (min) 34 min   Equipment Utilized During Treatment Gait belt   Activity Tolerance Patient limited by pain;Patient tolerated treatment well   Behavior During Therapy [facility] for tasks assessed/performed      Past Medical History  Diagnosis Date  . Depression with anxiety 03/01/2012  . Hyperlipidemia 03/01/2012  . Hypertension   . Cancer ([place]) 1994    BREAST CANCER, TREATED WITH RADIATION AND CHEMOTHERAPY  . Anxiety   . H/O vitamin D deficiency   . Age-related osteoporosis without current pathological fracture   . Fatty liver   . Rosacea     Past Surgical History  Procedure Laterality Date  . Mastectomy      left breast for cancer in 1994  . Breast surgery  1994    LEFT MASTECTOMY   . Cesarean section    . Cataract extraction Left   . Capsulotomy  august 2015    of left eye  . Colonoscopy N/A 05/13/2014    Procedure: COLONOSCOPY;  Surgeon: Rogene Houston, MD;  [facility];  Service: Endoscopy;  Laterality: N/A;  930  . Tonsillectomy      There were no vitals filed for this visit.      Subjective Assessment - 12/20/15 1445    Subjective Pt states her pain in her hip has improved. She feels she is improving overall, with no reported falls since beginning PT. She has not been out in her yard much.  She is trying to work more on her posture. She feels she may need to end her PT in the next week or so because of financial reasons.    Pertinent History Osteopenia, Multiple Falls, BUE tremor with unclear etiology, currently seeing a pain specialist for chronic lumbar back pain.    Patient Stated Goals The patient would like to be able to decrease falls at home and improve her confidence with higher level AMB outside of the home, including stairs to enter home.    Currently in Pain? No/denies   Pain Onset In the past 7 days            [facility] PT Assessment - 12/20/15 0001    Assessment   Medical Diagnosis Balance deficits with Multiple Falls    Referring Provider Dr. Luan Pulling   Onset Date/Surgical Date 11/16/14   Hand Dominance Right   Next MD Visit 7.11.17   Prior Therapy No prior PT; seeing a pain specialist   Precautions   Precautions Fall  multiple falls in the last year.    Restrictions   Weight Bearing Restrictions No   Balance Screen   Has the patient fallen in the past 6 months Yes   How many times? 4  no falls since beginning PT   Has the  patient had a decrease in activity level because of a fear of falling?  No   Is the patient reluctant to leave their home because of a fear of falling?  No   Prior Function   Level of Independence Independent;Independent with homemaking with ambulation;Requires assistive device for independence   Vocation Retired   Associate Professor   Overall Cognitive Status Within Functional Limits for tasks assessed   Observation/Other Assessments   Focus on Therapeutic Outcomes (FOTO)  39% limited   Observation/Other Assessments-Edema    Edema --  +1 pitting edema bilat, proximal the ankles   Sensation   Light Touch Appears Intact   Posture/Postural Control   Posture/Postural Control Postural limitations   Postural Limitations Anterior pelvic tilt;Increased thoracic kyphosis;Increased lumbar lordosis;Rounded Shoulders;Forward head;Weight shift left    AROM   Right Shoulder Flexion --   Left Shoulder Flexion --   Right Hip Extension --   PROM   Lumbar Flexion Stuck in lordosis during forward trunk flexion   Strength   Overall Strength Comments --   Right Elbow Flexion --   Right Elbow Extension --   Left Elbow Flexion --   Left Elbow Extension --   Right Hip Flexion 4/5   Right Hip Extension --  not assessed   Right Hip ABduction 4/5   Left Hip Flexion 4/5   Left Hip Extension --  not assessed   Left Hip ABduction 4-/5   Right Knee Flexion 5/5  seated, hip at 90*   Right Knee Extension 5/5   Left Knee Flexion 5/5  seated, hip at 90*   Left Knee Extension 5/5   Right Ankle Dorsiflexion 5/5   Right Ankle Plantar Flexion 5/5  soleus, seated   Left Ankle Dorsiflexion 5/5   Left Ankle Plantar Flexion 5/5  seated, hip at 90*   Thoracic   Thoracic Flexion excessive, with resting kyphosis   Thoracic Extension T-Spine remains flexed during extension activies, overhead reach   Ambulation/Gait   Ambulation Distance (Feet) --   Assistive device --   Gait Comments --   6 Minute Walk- Baseline   6 Minute Walk- Baseline no  2MWT: 287f, no AD   Berg Balance Test   Sit to Stand Able to stand without using hands and stabilize independently   Standing Unsupported Able to stand safely 2 minutes   Sitting with Back Unsupported but Feet Supported on Floor or Stool Able to sit safely and securely 2 minutes   Stand to Sit Sits safely with minimal use of hands   Transfers Able to transfer safely, minor use of hands   Standing Unsupported with Eyes Closed Able to stand 10 seconds safely   Standing Ubsupported with Feet Together Able to place feet together independently and stand 1 minute safely   From Standing, Reach Forward with Outstretched Arm Can reach forward >12 cm safely (5")  6"   From Standing Position, Pick up Object from Floor Able to pick up shoe safely and easily   From Standing Position, Turn to Look Behind Over each  Shoulder Looks behind from both sides and weight shifts well   Turn 360 Degrees Able to turn 360 degrees safely but slowly  6 sec   Standing Unsupported, Alternately Place Feet on Step/Stool Able to stand independently and safely and complete 8 steps in 20 seconds   Standing Unsupported, One Foot in Front Able to take small step independently and hold 30 seconds  L: 12 sec, R; 15 sec  Standing on One Leg Able to lift leg independently and hold equal to or more than 3 seconds  4-5 sec each   Total Score 49                     [facility] Adult PT Treatment/Exercise - 12/20/15 0001    Lumbar Exercises: Supine   Bridge 10 reps                PT Education - 12/20/15 1705    Education provided Yes   Education Details progress/goals met; POC; updated HEP   Person(s) Educated Patient   Methods Explanation;Demonstration;Handout   Comprehension Verbalized understanding;Returned demonstration          PT Short Term Goals - 12/20/15 1710    PT SHORT TERM GOAL #1   Title After 2 weeks pt will demonstrate indpe in HEP for improved self efficacy in PT outcomes.    Status Partially Met   PT SHORT TERM GOAL #2   Title After 4 weeks, pt will demonstrate a loss of lumbar lordosis with forward flexion activities to improve lumbopelvic motor control during gait.    Status Unable to assess   PT SHORT TERM GOAL #3   Title After 4 weeks pt will demonstrate BBT score increase to 50/56 to decrease falls risk.    Baseline 49/56   Status Partially Met   PT SHORT TERM GOAL #4   Title After 8 weeks, pt will demonstrate tolerance of 4 minutes sustained walking to improve access to the community to perform IADL.    Status Not Met           PT Long Term Goals - 12/20/15 1710    PT LONG TERM GOAL #1   Title After 7 weeks, pt will demonstrate advanced HEP for DC to promote continued progress toward goals s/p DC.    Status Not Met   PT LONG TERM GOAL #2   Title After 8 weeks pt will  demonstrate BBT score incease to 52/56 to decrease risk of falls with ADL/IADL.    Status Not Met   PT LONG TERM GOAL #3   Title After 8 weeks pt will demonstrate 2MWT distance of >475f to improve access of community for IADL.     Baseline 266 ft   Status Not Met   PT LONG TERM GOAL #4   Title After 8 weeks, pt will demonstrate 10 meter walk test gait speed of >1.02m to decrease risk of falls.    Status Not Met               Plan - 12/20/15 1715    Clinical Impression Statement pt was reassessed this visit having demonstrated slow progression towards some of her goals. She has improved BLE strength, balance and functional endurance from her initial evaluation and feels more balanced during daily acitivity. At this point in her POC, she reports fair compliance with her HEP and I discussed the importance of consistency to address areas of deficit. Her HEP was updated and she verbalized understanding at this time. She would continue to benefit from skilled PT to address limitations in strength, balance and functional performance to decrease risk of fall/injury at home and in the community.   Rehab Potential Good   Clinical Impairments Affecting Rehab Potential Chronicity   PT Frequency 2x / week   PT Duration 8 weeks   PT Treatment/Interventions Gait training;Stair training;Functional mobility training;Therapeutic activities;Therapeutic exercise;Balance training;Manual techniques;Prosthetic Training  PT Next Visit Plan hip extensor/abductor strength, balance with more dynamic activity.   PT Home Exercise Plan bridge, sls, marching, needs to be updated next visit    Consulted and Agree with Plan of Care Patient      Patient will benefit from skilled therapeutic intervention in order to improve the following deficits and impairments:  Abnormal gait, Decreased activity tolerance, Decreased balance, Decreased mobility, Hypomobility, Decreased strength, Decreased range of motion,  Difficulty walking, Increased edema, Postural dysfunction  Visit Diagnosis: Abnormal posture  History of falling  Muscle weakness (generalized)  Unsteadiness on feet       G-Codes - 2016-01-06 1715    Functional Assessment Tool Used FOTO   Functional Limitation Changing and maintaining body position   Changing and Maintaining Body Position Current Status (B3532) At least 20 percent but less than 40 percent impaired, limited or restricted   Changing and Maintaining Body Position Goal Status (D9242) At least 20 percent but less than 40 percent impaired, limited or restricted      Problem List Patient Active Problem List   Diagnosis Date Noted  . Abdominal distention 02/03/2015  . Osteopenia 03/18/2013  . Essential hypertension, benign 10/22/2012  . Fatty liver 10/22/2012  . Elevated liver enzymes 10/22/2012  . Breast cancer, stage 3 ([place]) 03/01/2012  . Depression with anxiety 03/01/2012  . Hyperlipidemia 03/01/2012   5:26 PM,Jan 06, 2016 Elly Modena PT, DPT [facility] [address] [place], [place], [address] Phone: [phone]   Fax:  [phone]  Name: Tammy Terry MRN: [id number] Date of Birth: 07/12/1940

## 2015-12-22 ENCOUNTER — Encounter (HOSPITAL_COMMUNITY): Payer: Medicare Other

## 2015-12-22 ENCOUNTER — Telehealth (HOSPITAL_COMMUNITY): Payer: Self-pay

## 2015-12-22 NOTE — Telephone Encounter (Signed)
12/22/15 patient called to cx because she has an upset stomach and we confirmed the next appt.

## 2015-12-31 ENCOUNTER — Ambulatory Visit (HOSPITAL_COMMUNITY): Payer: Medicare Other | Attending: Pulmonary Disease | Admitting: Physical Therapy

## 2015-12-31 DIAGNOSIS — R293 Abnormal posture: Secondary | ICD-10-CM | POA: Diagnosis not present

## 2015-12-31 DIAGNOSIS — Z9181 History of falling: Secondary | ICD-10-CM | POA: Insufficient documentation

## 2015-12-31 DIAGNOSIS — R2681 Unsteadiness on feet: Secondary | ICD-10-CM | POA: Diagnosis present

## 2015-12-31 DIAGNOSIS — M6281 Muscle weakness (generalized): Secondary | ICD-10-CM

## 2015-12-31 NOTE — Therapy (Signed)
[facility] [address] [place], [place], [address] Phone: [phone]   Fax:  [phone]  Physical Therapy Treatment  Patient Details  Name: Tammy Terry MRN: [id number] Date of Birth: 1940/07/08 Referring Provider: Dr. Juanetta Gosling  Encounter Date: 12/31/2015      PT End of Session - 12/31/15 1541    Visit Number 10   Number of Visits 10   Date for PT Re-Evaluation 12/17/15   Authorization Type UHC Medicare   Authorization Time Period 11/16/15-01/16/16   Authorization - Visit Number 10   Authorization - Number of Visits 10   PT Start Time 1515   PT Stop Time 1554   PT Time Calculation (min) 39 min   Equipment Utilized During Treatment Gait belt   Activity Tolerance Patient limited by pain;Patient tolerated treatment well   Behavior During Therapy [facility] for tasks assessed/performed      Past Medical History  Diagnosis Date  . Depression with anxiety 03/01/2012  . Hyperlipidemia 03/01/2012  . Hypertension   . Cancer ([facility]) 1994    BREAST CANCER, TREATED WITH RADIATION AND CHEMOTHERAPY  . Anxiety   . H/O vitamin D deficiency   . Age-related osteoporosis without current pathological fracture   . Fatty liver   . Rosacea     Past Surgical History  Procedure Laterality Date  . Mastectomy      left breast for cancer in 1994  . Breast surgery  1994    LEFT MASTECTOMY   . Cesarean section    . Cataract extraction Left   . Capsulotomy  august 2015    of left eye  . Colonoscopy N/A 05/13/2014    Procedure: COLONOSCOPY;  Surgeon: Malissa Hippo, MD;  [facility];  Service: Endoscopy;  Laterality: N/A;  930  . Tonsillectomy      There were no vitals filed for this visit.      Subjective Assessment - 12/31/15 1519    Subjective My low back started bothering me a little a few hours ago.  She is not doing her exercises .She wants to be done with therapy today     Pertinent History Osteopenia, Multiple Falls, BUE tremor  with unclear etiology, currently seeing a pain specialist for chronic lumbar back pain.    How long can you sit comfortably? no problem    Patient Stated Goals The patient would like to be able to decrease falls at home and improve her confidence with higher level AMB outside of the home, including stairs to enter home.    Currently in Pain? Yes   Pain Score 2    Pain Location Back   Pain Orientation Right;Left   Pain Onset In the past 7 days   Pain Frequency Intermittent   Aggravating Factors  prolong standing             [facility] PT Assessment - 12/31/15 0001    Assessment   Medical Diagnosis Balance deficits with Multiple Falls    Onset Date/Surgical Date 11/16/14   Hand Dominance Right   Next MD Visit 7.11.17   Prior Therapy No prior PT; seeing a pain specialist   Precautions   Precautions Fall  multiple falls in the last year.    Restrictions   Weight Bearing Restrictions No   Prior Function   Level of Independence Independent;Independent with homemaking with ambulation;Requires assistive device for independence   Vocation Retired   IT consultant   Overall Cognitive Status Within Functional Limits  for tasks assessed   Observation/Other Assessments   Focus on Therapeutic Outcomes (FOTO)  39% limited   Observation/Other Assessments-Edema    Edema --  +1 pitting edema bilat, proximal the ankles   Sensation   Light Touch Appears Intact   Posture/Postural Control   Posture/Postural Control Postural limitations   Postural Limitations Anterior pelvic tilt;Increased thoracic kyphosis;Increased lumbar lordosis;Rounded Shoulders;Forward head;Weight shift left   PROM   Lumbar Flexion Stuck in lordosis during forward trunk flexion   Strength   Right Hip Flexion 4/5   Right Hip Extension --  not assessed   Right Hip ABduction 4/5   Left Hip Flexion 4/5   Left Hip Extension --  not assessed   Left Hip ABduction 4-/5   Right Knee Flexion 5/5  seated, hip at 90*   Right Knee  Extension 5/5   Left Knee Flexion 5/5  seated, hip at 90*   Left Knee Extension 5/5   Right Ankle Dorsiflexion 5/5   Right Ankle Plantar Flexion 5/5  soleus, seated   Left Ankle Dorsiflexion 5/5   Left Ankle Plantar Flexion 5/5  seated, hip at 90*   Thoracic   Thoracic Flexion excessive, with resting kyphosis   Thoracic Extension T-Spine remains flexed during extension activies, overhead reach   6 Minute Walk- Baseline   6 Minute Walk- Baseline no  2MWT: 253f, no AD   Berg Balance Test   Sit to Stand Able to stand without using hands and stabilize independently   Standing Unsupported Able to stand safely 2 minutes   Sitting with Back Unsupported but Feet Supported on Floor or Stool Able to sit safely and securely 2 minutes   Stand to Sit Sits safely with minimal use of hands   Transfers Able to transfer safely, minor use of hands   Standing Unsupported with Eyes Closed Able to stand 10 seconds safely   Standing Ubsupported with Feet Together Able to place feet together independently and stand 1 minute safely   From Standing, Reach Forward with Outstretched Arm Can reach forward >12 cm safely (5")  6"   From Standing Position, Pick up Object from Floor Able to pick up shoe safely and easily   From Standing Position, Turn to Look Behind Over each Shoulder Looks behind from both sides and weight shifts well   Turn 360 Degrees Able to turn 360 degrees safely but slowly  6 sec   Standing Unsupported, Alternately Place Feet on Step/Stool Able to stand independently and safely and complete 8 steps in 20 seconds   Standing Unsupported, One Foot in Front Able to take small step independently and hold 30 seconds  L: 12 sec, R; 15 sec   Standing on One Leg Able to lift leg independently and hold equal to or more than 3 seconds  4-5 sec each   Total Score 49                     [facility] Adult PT Treatment/Exercise - 12/31/15 0001    Lumbar Exercises: Standing   Heel Raises 10  reps   Functional Squats 10 reps   Other Standing Lumbar Exercises scapular retraction x 10; cervical retraction x 10    Other Standing Lumbar Exercises SLS B x 3    Lumbar Exercises: Supine   Bridge 15 reps                  PT Short Term Goals - 12/31/15 1545    PT  SHORT TERM GOAL #1   Title After 2 weeks pt will demonstrate indpe in HEP for improved self efficacy in PT outcomes.    Status Partially Met   PT SHORT TERM GOAL #2   Title After 4 weeks, pt will demonstrate a loss of lumbar lordosis with forward flexion activities to improve lumbopelvic motor control during gait.    Status Unable to assess   PT SHORT TERM GOAL #3   Title After 4 weeks pt will demonstrate BBT score increase to 50/56 to decrease falls risk.    Baseline 49/56   Status Partially Met   PT SHORT TERM GOAL #4   Title After 8 weeks, pt will demonstrate tolerance of 4 minutes sustained walking to improve access to the community to perform IADL.    Status Not Met           PT Long Term Goals - Jan 01, 2016 1545    PT LONG TERM GOAL #1   Title After 7 weeks, pt will demonstrate advanced HEP for DC to promote continued progress toward goals s/p DC.    Status Not Met   PT LONG TERM GOAL #2   Title After 8 weeks pt will demonstrate BBT score incease to 52/56 to decrease risk of falls with ADL/IADL.    Status Not Met   PT LONG TERM GOAL #3   Title After 8 weeks pt will demonstrate 2MWT distance of >48f to improve access of community for IADL.     Baseline 266 ft   Status Not Met   PT LONG TERM GOAL #4   Title After 8 weeks, pt will demonstrate 10 meter walk test gait speed of >1.036m to decrease risk of falls.    Status Not Met               Plan - 072017/07/08543    Clinical Impression Statement Pt request that today be her last day stating that she is tired of coming to therapy.  Pt HEP updated.  Pt continues to have decreased balance but strength has improved.  Limited gains due to pt not  completed as instructed    Rehab Potential Good   Clinical Impairments Affecting Rehab Potential Chronicity   PT Frequency 2x / week   PT Duration 8 weeks   PT Treatment/Interventions Gait training;Stair training;Functional mobility training;Therapeutic activities;Therapeutic exercise;Balance training;Manual techniques;Prosthetic Training   PT Next Visit Plan discharge    PT Home Exercise Plan bridge, sls, marching, needs to be updated next visit    Consulted and Agree with Plan of Care Patient      Patient will benefit from skilled therapeutic intervention in order to improve the following deficits and impairments:  Abnormal gait, Decreased activity tolerance, Decreased balance, Decreased mobility, Hypomobility, Decreased strength, Decreased range of motion, Difficulty walking, Increased edema, Postural dysfunction  Visit Diagnosis: Abnormal posture  History of falling  Muscle weakness (generalized)  Unsteadiness on feet       G-Codes - 0707/08/2017546    Functional Limitation Changing and maintaining body position   Changing and Maintaining Body Position Goal Status (G(H0623At least 20 percent but less than 40 percent impaired, limited or restricted   Changing and Maintaining Body Position Discharge Status (G(J6283At least 20 percent but less than 40 percent impaired, limited or restricted      Problem List Patient Active Problem List   Diagnosis Date Noted  . Abdominal distention 02/03/2015  . Osteopenia 03/18/2013  . Essential hypertension, benign 10/22/2012  .  Fatty liver 10/22/2012  . Elevated liver enzymes 10/22/2012  . Breast cancer, stage 3 ([place]) 03/01/2012  . Depression with anxiety 03/01/2012  . Hyperlipidemia 03/01/2012    Azucena Freed PT/CLT [phone] 12/31/2015, 3:55 PM  [facility] [address] [place], [place], [address] Phone: [phone]   Fax:  [phone]  Name: Tammy Terry MRN: [id number] Date  of Birth: 08-23-1940  PHYSICAL THERAPY DISCHARGE SUMMARY  Visits from Start of Care: 10  Current functional level related to goals / functional outcomes: Please see above   Remaining deficits: See above    Education / Equipment: HEP  Plan: Patient agrees to discharge.  Patient goals were partially met. Patient is being discharged due to the patient's request.  ?????       Rayetta Humphrey, Ruth CLT [phone]

## 2015-12-31 NOTE — Patient Instructions (Signed)
Flexibility: Neck Retraction    Pull head straight back, keeping eyes and jaw level. Repeat __10__ times per set. Do _1___ sets per session. Do ____ sessions per day. 2 http://orth.exer.us/344   Copyright  VHI. All rights reserved.  Scapular Retraction (Standing)    With arms at sides, pinch shoulder blades together. Repeat __10__ times per set. Do _1___ sets per session. Do _2___ sessions per day.  http://orth.exer.us/944   Copyright  VHI. All rights reserved.  Heel Raise: Bilateral (Standing)    Rise on balls of feet. Repeat _10___ times per set. Do ___1_ sets per session. Do _2___ sessions per day.  http://orth.exer.us/38   Copyright  VHI. All rights reserved.  Functional Quadriceps: Chair Squat    Keeping feet flat on floor, shoulder width apart, squat as low as is comfortable. Use support as necessary. Repeat _10___ times per set. Do _1___ sets per session. Do ___2_ sessions per day.  http://orth.exer.us/736   Copyright  VHI. All rights reserved.  Bridging    Slowly raise buttocks from floor, keeping stomach tight. Repeat __10__ times per set. Do _1___ sets per session. Do ___2_ sessions per day.  http://orth.exer.us/1096   Copyright  VHI. All rights reserved.  Balance: Unilateral    Attempt to balance on left leg, eyes open. Hold _5-15___ seconds. Repeat _10___ times per set. Do _1___ sets per session. Do _2___ sessions per day. Perform exercise with eyes closed.  http://orth.exer.us/28   Copyright  VHI. All rights reserved.  Feet Heel-Toe "Tandem" (Compliant Surface) Arm Motion - Eyes Open    With eyes open, standing on the floor , right foot directly in front of the other, Hold 5-15 seconds repeat to other side  Copyright  VHI. All rights reserved.  Strengthening: Hip Abduction (Side-Lying)    Tighten muscles on front of left thigh, then lift leg ___15_ inches from surface, keeping knee locked.  Repeat _10___ times per set. Do  _1___ sets per session. Do _2___ sessions per day. 2 http://orth.exer.us/622   Copyright  VHI. All rights reserved.

## 2016-01-03 ENCOUNTER — Encounter (HOSPITAL_COMMUNITY): Payer: Medicare Other | Admitting: Physical Therapy

## 2016-01-07 ENCOUNTER — Encounter (HOSPITAL_COMMUNITY): Payer: Medicare Other | Admitting: Physical Therapy

## 2016-01-10 ENCOUNTER — Encounter (HOSPITAL_COMMUNITY): Payer: Medicare Other | Admitting: Physical Therapy

## 2016-01-12 ENCOUNTER — Encounter (HOSPITAL_COMMUNITY): Payer: Medicare Other | Admitting: Physical Therapy

## 2016-01-24 ENCOUNTER — Ambulatory Visit (HOSPITAL_COMMUNITY)
Admission: RE | Admit: 2016-01-24 | Discharge: 2016-01-24 | Disposition: A | Discharge status: Home / Self Care | Payer: Medicare Other | Source: Ambulatory Visit | Attending: Pulmonary Disease | Admitting: Pulmonary Disease

## 2016-01-24 ENCOUNTER — Other Ambulatory Visit: Payer: Self-pay | Admitting: Gynecology

## 2016-01-24 ENCOUNTER — Other Ambulatory Visit (HOSPITAL_COMMUNITY): Payer: Self-pay | Admitting: Pulmonary Disease

## 2016-01-24 DIAGNOSIS — M19071 Primary osteoarthritis, right ankle and foot: Secondary | ICD-10-CM | POA: Diagnosis not present

## 2016-01-24 DIAGNOSIS — M79671 Pain in right foot: Secondary | ICD-10-CM | POA: Insufficient documentation

## 2016-01-24 DIAGNOSIS — R52 Pain, unspecified: Secondary | ICD-10-CM

## 2016-01-24 DIAGNOSIS — Z1231 Encounter for screening mammogram for malignant neoplasm of breast: Secondary | ICD-10-CM

## 2016-01-24 DIAGNOSIS — M2011 Hallux valgus (acquired), right foot: Secondary | ICD-10-CM | POA: Diagnosis not present

## 2016-01-25 ENCOUNTER — Telehealth: Payer: Self-pay | Admitting: Gynecology

## 2016-01-28 ENCOUNTER — Ambulatory Visit (HOSPITAL_COMMUNITY)
Admission: RE | Admit: 2016-01-28 | Discharge: 2016-01-28 | Disposition: A | Discharge status: Home / Self Care | Payer: Medicare Other | Source: Ambulatory Visit | Attending: Gynecology | Admitting: Gynecology

## 2016-01-28 DIAGNOSIS — Z1231 Encounter for screening mammogram for malignant neoplasm of breast: Secondary | ICD-10-CM | POA: Diagnosis not present

## 2016-02-11 ENCOUNTER — Telehealth: Payer: Self-pay | Admitting: Gynecology

## 2016-02-11 DIAGNOSIS — M810 Age-related osteoporosis without current pathological fracture: Secondary | ICD-10-CM

## 2016-02-11 NOTE — Telephone Encounter (Signed)
Reclast due after 02/23/16  pc left message to call me to schedule labs.

## 2016-02-14 NOTE — Telephone Encounter (Signed)
reclast due after 02/23/16

## 2016-02-22 NOTE — Telephone Encounter (Signed)
PC left VM to call to schedule her Reclast

## 2016-02-23 NOTE — Telephone Encounter (Signed)
VM from pt she states she is not able to continue her Reclast this month and possibly not this year. I called her and once again got her VM

## 2016-02-24 ENCOUNTER — Encounter: Payer: Self-pay | Admitting: Gynecology

## 2016-02-24 DIAGNOSIS — M810 Age-related osteoporosis without current pathological fracture: Secondary | ICD-10-CM | POA: Insufficient documentation

## 2016-02-24 NOTE — Telephone Encounter (Signed)
I called Tammy Terry  Reclast due after 02/23/16  . She has received Reclast in 2014 and 2016. She is unable to have the Reclast at this time due to care giving responsibility for her husband. She also requested that she would like to know what will happen to her if she decides to stop Reclast. I explained to her about risk of falling and the result maybe fractures, she would like to ask Dr Toney Rakes these questions . I will ask Dr Toney Rakes if he would like for her to come in to the office to discuss.

## 2016-02-24 NOTE — Telephone Encounter (Signed)
She is due for her annual and bone density study. Let's have her come mid-September for her bone density study along with checking her calcium, vitamin D and PTH at that same time and then an appointment to see me for her annual exam 1-2 weeks after the bone density study so we can discuss and hold off on the Reclast until we discussed in the office at time of her annual exam

## 2016-02-25 NOTE — Telephone Encounter (Signed)
Tammy Terry I placed orders for labs and bone density.

## 2016-03-07 NOTE — Telephone Encounter (Signed)
Call patient left VM for her to call and schedule bone density  And lab work  per JF note and complete exam after bone density results

## 2016-03-27 NOTE — Telephone Encounter (Signed)
We have left numerous messages regarding patient Reclast with no return phone calls from patient. Mobile number does not accept VM. Will notify Dr Toney Rakes and sent letter to patient.

## 2016-03-28 ENCOUNTER — Encounter: Payer: Self-pay | Admitting: Gynecology

## 2016-04-04 NOTE — Telephone Encounter (Signed)
Dr Toney Rakes this patient has not responded to letter or phone calls For her office visit , bone density and Reclast. I am closing her encounter and wanted you to know.

## 2016-04-04 NOTE — Telephone Encounter (Signed)
I would send this lady a letter to contact the office

## 2016-04-17 ENCOUNTER — Ambulatory Visit: Payer: Medicare Other | Admitting: Neurology

## 2016-06-06 ENCOUNTER — Other Ambulatory Visit (HOSPITAL_COMMUNITY): Payer: Self-pay | Admitting: Pulmonary Disease

## 2016-06-06 DIAGNOSIS — R410 Disorientation, unspecified: Secondary | ICD-10-CM

## 2016-06-07 ENCOUNTER — Ambulatory Visit (HOSPITAL_COMMUNITY)
Admission: RE | Admit: 2016-06-07 | Discharge: 2016-06-07 | Disposition: A | Discharge status: Home / Self Care | Payer: Medicare Other | Source: Ambulatory Visit | Attending: Pulmonary Disease | Admitting: Pulmonary Disease

## 2016-06-07 ENCOUNTER — Other Ambulatory Visit (HOSPITAL_COMMUNITY): Payer: Self-pay | Admitting: Pulmonary Disease

## 2016-06-07 DIAGNOSIS — I340 Nonrheumatic mitral (valve) insufficiency: Secondary | ICD-10-CM | POA: Insufficient documentation

## 2016-06-07 DIAGNOSIS — Z853 Personal history of malignant neoplasm of breast: Secondary | ICD-10-CM | POA: Insufficient documentation

## 2016-06-07 DIAGNOSIS — I351 Nonrheumatic aortic (valve) insufficiency: Secondary | ICD-10-CM | POA: Insufficient documentation

## 2016-06-07 DIAGNOSIS — R4182 Altered mental status, unspecified: Secondary | ICD-10-CM | POA: Diagnosis present

## 2016-06-07 DIAGNOSIS — R410 Disorientation, unspecified: Secondary | ICD-10-CM | POA: Diagnosis not present

## 2016-06-07 DIAGNOSIS — I119 Hypertensive heart disease without heart failure: Secondary | ICD-10-CM | POA: Diagnosis not present

## 2016-06-07 DIAGNOSIS — E785 Hyperlipidemia, unspecified: Secondary | ICD-10-CM | POA: Diagnosis not present

## 2016-06-07 LAB — ECHOCARDIOGRAM COMPLETE
AO mean calculated velocity dopler: 112 cm/s
AOVTI: 38.1 cm
AV Mean grad: 6 mmHg
AV VEL mean LVOT/AV: 0.84
AV vel: 1.46
AVA: 1.46 cm2
AVAREAMEANV: 1.69 cm2
AVAREAMEANVIN: 0.89 cm2/m2
AVAREAVTI: 1.48 cm2
AVAREAVTIIND: 0.77 cm2/m2
AVLVOTPG: 8 mmHg
AVPG: 15 mmHg
AVPHT: 598 ms
AVPKVEL: 192 cm/s
Ao pk vel: 0.73 m/s
CHL CUP AV PEAK INDEX: 0.78
CHL CUP DOP CALC LVOT VTI: 27.7 cm
CHL CUP MV DEC (S): 377
EERAT: 10.93
EWDT: 377 ms
FS: 35 % (ref 28–44)
IV/PV OW: 1.07
LA ID, A-P, ES: 32 mm
LA diam index: 1.68 cm/m2
LA vol A4C: 34.7 ml
LAVOL: 35.2 mL
LAVOLIN: 18.5 mL/m2
LDCA: 2.01 cm2
LEFT ATRIUM END SYS DIAM: 32 mm
LV E/e'average: 10.93
LV PW d: 13.6 mm — AB (ref 0.6–1.1)
LV dias vol index: 20 mL/m2
LV dias vol: 37 mL — AB (ref 46–106)
LVEEMED: 10.93
LVELAT: 6.74 cm/s
LVOT SV: 56 mL
LVOT diameter: 16 mm
LVOT peak VTI: 0.73 cm
LVOT peak vel: 141 cm/s
Lateral S' vel: 11.5 cm/s
MV pk A vel: 122 m/s
MVPG: 2 mmHg
MVPKEVEL: 73.7 m/s
TAPSE: 16.5 mm
TDI e' lateral: 6.74
TDI e' medial: 6.09
Valve area index: 0.77

## 2016-06-07 NOTE — Progress Notes (Signed)
*  PRELIMINARY RESULTS* Echocardiogram 2D Echocardiogram has been performed.  Tammy Terry 06/07/2016, 12:42 PM

## 2016-06-08 ENCOUNTER — Ambulatory Visit (INDEPENDENT_AMBULATORY_CARE_PROVIDER_SITE_OTHER): Payer: Medicare Other | Admitting: Cardiovascular Disease

## 2016-06-08 VITALS — BP 158/78 | HR 97 | Ht 65.0 in | Wt 175.0 lb

## 2016-06-08 DIAGNOSIS — I10 Essential (primary) hypertension: Secondary | ICD-10-CM | POA: Diagnosis not present

## 2016-06-08 NOTE — Patient Instructions (Signed)
Your physician recommends that you schedule a follow-up appointment in: if needed     Thank you for choosing [facility] !

## 2016-06-08 NOTE — Progress Notes (Signed)
CARDIOLOGY CONSULT NOTE  Patient ID: Tammy Terry MRN: [id number] DOB/AGE: [birth year] 75 y.o.  Admit date: (Not on file) Primary Physician: Alonza Bogus, MD Referring Physician:   Reason for Consultation: syncope  HPI: 75 year old woman with history of hypertension and breast cancer referred for evaluation of syncope.  Echocardiogram 06/07/16: Vigorous left ventricle systolic function, EF [id number], grade 1 diastolic dysfunction, mild aortic and mitral regurgitation.  She saw her PCP on 06/02/16. She has had multiple falls in the past one or 2 months. She apparently had an episode of being less responsive than usual and being confused. She has had episodes of syncope with no definitive etiology.  Dr. Luan Pulling mentions ordering a brain MRI, EEG, an event monitor, as well as neurologic consultation for possible seizures.  ECG performed in the office today which I personally reviewed demonstrates normal sinus rhythm with no ischemic ST segment or T-wave abnormalities, nor any arrhythmias.  She denies antecedent chest pain, palpitations, shortness of breath, and dizziness. She denies exertional chest pain and dyspnea. She denies orthopnea and leg swelling. She said she always falls backwards. She said there was one episode where she got off the commode and went and stood at the sink and forgot why she was there.     Allergies  Allergen Reactions  . Amoxicillin   . Codeine     Current Outpatient Prescriptions  Medication Sig Dispense Refill  . Cholecalciferol (VITAMIN D-3 PO) Take 2,000 Int'l Units by mouth daily.    . clonazePAM (KLONOPIN) 0.5 MG tablet Take 0.25 mg by mouth 4 (four) times daily as needed for anxiety.     Marland Kitchen ibuprofen (ADVIL,MOTRIN) 200 MG tablet Take 200 mg by mouth every 6 (six) hours as needed.    Marland Kitchen losartan (COZAAR) 50 MG tablet Take 50 mg by mouth daily.    . Polyethylene Glycol 3350 (MIRALAX PO) Take by mouth.    . traMADol (ULTRAM) 50 MG tablet  Take by mouth every 6 (six) hours as needed.    . venlafaxine (EFFEXOR) 75 MG tablet Take 225 mg by mouth daily.      No current facility-administered medications for this visit.     Past Medical History:  Diagnosis Date  . Age-related osteoporosis without current pathological fracture   . Anxiety   . Cancer ([place]) 1994   BREAST CANCER, TREATED WITH RADIATION AND CHEMOTHERAPY  . Depression with anxiety 03/01/2012  . Fatty liver   . H/O vitamin D deficiency   . Hyperlipidemia 03/01/2012  . Hypertension   . Osteoporosis   . Rosacea     Past Surgical History:  Procedure Laterality Date  . BREAST SURGERY  1994   LEFT MASTECTOMY   . CAPSULOTOMY  august 2015   of left eye  . CATARACT EXTRACTION Left   . CESAREAN SECTION    . COLONOSCOPY N/A 05/13/2014   Procedure: COLONOSCOPY;  Surgeon: Rogene Houston, MD;  Location: [facility];  Service: Endoscopy;  Laterality: N/A;  930  . MASTECTOMY     left breast for cancer in 1994  . TONSILLECTOMY      Social History   Social History  . Marital status: Married    Spouse name: N/A  . Number of children: 1  . Years of education: college   Occupational History  . Retired    Social History Main Topics  . Smoking status: Never Smoker  . Smokeless tobacco: Not on file  . Alcohol use No  .  Drug use: No  . Sexual activity: No   Other Topics Concern  . Not on file   Social History Narrative   Denies caffeine use      No family history of premature CAD in 1st degree relatives.  Prior to Admission medications   Medication Sig Start Date End Date Taking? Authorizing Provider  calcium carbonate (OS-CAL) 600 MG TABS tablet Take 600 mg by mouth 2 (two) times daily with a meal.    Historical Provider, MD  Cholecalciferol (VITAMIN D-3 PO) Take 2,000 Int'l Units by mouth daily.    Historical Provider, MD  clonazePAM (KLONOPIN) 0.5 MG tablet Take 0.25 mg by mouth 4 (four) times daily as needed for anxiety.     Historical Provider, MD    ibuprofen (ADVIL,MOTRIN) 200 MG tablet Take 200 mg by mouth every 6 (six) hours as needed.    Historical Provider, MD  losartan (COZAAR) 50 MG tablet Take 50 mg by mouth daily.    Historical Provider, MD  Polyethylene Glycol 3350 (MIRALAX PO) Take by mouth.    Historical Provider, MD  traMADol (ULTRAM) 50 MG tablet Take by mouth every 6 (six) hours as needed.    Historical Provider, MD  venlafaxine (EFFEXOR) 75 MG tablet Take 225 mg by mouth daily.     Historical Provider, MD     Review of systems complete and found to be negative unless listed above in HPI     Physical exam Blood pressure (!) 158/78, pulse 97, height 5\' 5"  (1.651 m), weight 175 lb (79.4 kg), SpO2 97 %. General: NAD Neck: No JVD, no thyromegaly or thyroid nodule.  Lungs: Clear to auscultation bilaterally with normal respiratory effort. CV: Nondisplaced PMI. Regular rate and rhythm, normal S1/S2, no [id number], soft 1/6 systolic murmur over RUSB.  No peripheral edema.  No carotid bruit.   Abdomen: Soft, nontender, no hepatosplenomegaly, no distention.  Skin: Intact without lesions or rashes.  Neurologic: Alert and oriented x 3.  Psych: Normal affect. Extremities: No clubbing or cyanosis.  HEENT: Normal.   ECG: Most recent ECG reviewed.  Labs:   Lab Results  Component Value Date   WBC 5.8 02/23/2014   HGB 12.8 02/23/2014   HCT 39.9 02/23/2014   MCV 93.0 02/23/2014   PLT 145 02/23/2014   No results for input(s): NA, K, CL, CO2, BUN, CREATININE, CALCIUM, PROT, BILITOT, ALKPHOS, ALT, AST, GLUCOSE in the last 168 hours.  Invalid input(s): LABALBU No results found for: CKTOTAL, CKMB, CKMBINDEX, TROPONINI No results found for: CHOL No results found for: HDL No results found for: LDLCALC No results found for: TRIG No results found for: CHOLHDL No results found for: LDLDIRECT       Studies: No results found.  ASSESSMENT AND PLAN:  1. Syncope: Based upon her history and presentation this does not appear to be  arrhythmic in etiology. ECG and echocardiogram are unremarkable.  It definitely sounds more neurologic in nature. I would recommend proceeding with MRI of the brain and EEG. If these are unrevealing an event monitor could always be considered but I think at this time it would be of low yield.  2. Hypertension: Elevated. May need medication adjustments in the future.  Dispo: fu prn.   Signed: Kate Sable, M.D., F.A.C.C.  06/08/2016, 1:58 PM

## 2016-06-13 ENCOUNTER — Ambulatory Visit (HOSPITAL_COMMUNITY)
Admission: RE | Admit: 2016-06-13 | Discharge: 2016-06-13 | Disposition: A | Discharge status: Home / Self Care | Payer: Medicare Other | Source: Ambulatory Visit | Attending: Pulmonary Disease | Admitting: Pulmonary Disease

## 2016-06-13 DIAGNOSIS — I638 Other cerebral infarction: Secondary | ICD-10-CM | POA: Insufficient documentation

## 2016-06-13 DIAGNOSIS — I6782 Cerebral ischemia: Secondary | ICD-10-CM | POA: Insufficient documentation

## 2016-06-13 DIAGNOSIS — R410 Disorientation, unspecified: Secondary | ICD-10-CM

## 2016-06-13 DIAGNOSIS — R4182 Altered mental status, unspecified: Secondary | ICD-10-CM | POA: Diagnosis present

## 2016-06-13 DIAGNOSIS — G319 Degenerative disease of nervous system, unspecified: Secondary | ICD-10-CM | POA: Diagnosis not present

## 2016-10-02 ENCOUNTER — Other Ambulatory Visit (HOSPITAL_COMMUNITY): Payer: Self-pay | Admitting: Pulmonary Disease

## 2016-10-02 DIAGNOSIS — Z780 Asymptomatic menopausal state: Secondary | ICD-10-CM

## 2016-10-11 ENCOUNTER — Other Ambulatory Visit (HOSPITAL_COMMUNITY): Payer: Medicare Other

## 2016-10-11 ENCOUNTER — Ambulatory Visit (HOSPITAL_COMMUNITY)
Admission: RE | Admit: 2016-10-11 | Discharge: 2016-10-11 | Disposition: A | Discharge status: Home / Self Care | Payer: Medicare Other | Source: Ambulatory Visit | Attending: Pulmonary Disease | Admitting: Pulmonary Disease

## 2016-10-11 DIAGNOSIS — N951 Menopausal and female climacteric states: Secondary | ICD-10-CM | POA: Diagnosis present

## 2016-10-11 DIAGNOSIS — Z780 Asymptomatic menopausal state: Secondary | ICD-10-CM

## 2016-10-11 DIAGNOSIS — M8588 Other specified disorders of bone density and structure, other site: Secondary | ICD-10-CM | POA: Diagnosis not present

## 2016-10-11 LAB — HM DEXA SCAN

## 2016-11-08 ENCOUNTER — Encounter: Payer: Self-pay | Admitting: Gynecology

## 2016-12-25 ENCOUNTER — Ambulatory Visit (INDEPENDENT_AMBULATORY_CARE_PROVIDER_SITE_OTHER): Payer: Medicare Other | Admitting: Otolaryngology

## 2016-12-25 DIAGNOSIS — R490 Dysphonia: Secondary | ICD-10-CM

## 2016-12-25 DIAGNOSIS — K219 Gastro-esophageal reflux disease without esophagitis: Secondary | ICD-10-CM

## 2016-12-25 DIAGNOSIS — H6123 Impacted cerumen, bilateral: Secondary | ICD-10-CM | POA: Diagnosis not present

## 2017-01-12 ENCOUNTER — Other Ambulatory Visit (HOSPITAL_COMMUNITY): Payer: Self-pay | Admitting: Pulmonary Disease

## 2017-01-12 DIAGNOSIS — Z1231 Encounter for screening mammogram for malignant neoplasm of breast: Secondary | ICD-10-CM

## 2017-01-29 ENCOUNTER — Ambulatory Visit (HOSPITAL_COMMUNITY)
Admission: RE | Admit: 2017-01-29 | Discharge: 2017-01-29 | Disposition: A | Discharge status: Home / Self Care | Payer: Medicare Other | Source: Ambulatory Visit | Attending: Pulmonary Disease | Admitting: Pulmonary Disease

## 2017-01-29 DIAGNOSIS — Z9012 Acquired absence of left breast and nipple: Secondary | ICD-10-CM | POA: Insufficient documentation

## 2017-01-29 DIAGNOSIS — Z1231 Encounter for screening mammogram for malignant neoplasm of breast: Secondary | ICD-10-CM | POA: Diagnosis present

## 2017-01-29 LAB — HM MAMMOGRAPHY

## 2017-03-12 ENCOUNTER — Ambulatory Visit (INDEPENDENT_AMBULATORY_CARE_PROVIDER_SITE_OTHER): Payer: Medicare Other | Admitting: Otolaryngology

## 2017-03-12 DIAGNOSIS — R490 Dysphonia: Secondary | ICD-10-CM

## 2017-03-12 DIAGNOSIS — K219 Gastro-esophageal reflux disease without esophagitis: Secondary | ICD-10-CM

## 2017-05-21 ENCOUNTER — Ambulatory Visit (HOSPITAL_COMMUNITY)
Admission: RE | Admit: 2017-05-21 | Discharge: 2017-05-21 | Disposition: A | Discharge status: Home / Self Care | Payer: Medicare Other | Source: Ambulatory Visit | Attending: Pulmonary Disease | Admitting: Pulmonary Disease

## 2017-05-21 ENCOUNTER — Ambulatory Visit (INDEPENDENT_AMBULATORY_CARE_PROVIDER_SITE_OTHER): Payer: Medicare Other | Admitting: Otolaryngology

## 2017-05-21 ENCOUNTER — Other Ambulatory Visit (HOSPITAL_COMMUNITY): Payer: Self-pay | Admitting: Pulmonary Disease

## 2017-05-21 DIAGNOSIS — M7989 Other specified soft tissue disorders: Secondary | ICD-10-CM

## 2017-05-21 DIAGNOSIS — R2240 Localized swelling, mass and lump, unspecified lower limb: Secondary | ICD-10-CM | POA: Insufficient documentation

## 2017-05-21 DIAGNOSIS — R490 Dysphonia: Secondary | ICD-10-CM | POA: Diagnosis not present

## 2017-05-21 DIAGNOSIS — K219 Gastro-esophageal reflux disease without esophagitis: Secondary | ICD-10-CM | POA: Diagnosis not present

## 2017-05-21 DIAGNOSIS — M79605 Pain in left leg: Secondary | ICD-10-CM

## 2017-06-22 ENCOUNTER — Other Ambulatory Visit: Payer: Self-pay

## 2017-06-22 NOTE — Patient Outreach (Signed)
[facility]) Care Management  06/22/2017  Tammy Terry 02-11-1941 [id number]   Medication Adherence call to [name] patient is showing past due under [facility] Ins.on Losartan 50 mg spoke with patient she said she still has medication and will order it when she only has a few left, offer to call the pharmacy for her,she refuse she said she will call it in her self.  [place] Management Direct Dial [phone]  Fax [phone] [email or web address]

## 2017-08-20 ENCOUNTER — Ambulatory Visit (INDEPENDENT_AMBULATORY_CARE_PROVIDER_SITE_OTHER): Payer: Medicare Other | Admitting: Otolaryngology

## 2017-08-20 ENCOUNTER — Other Ambulatory Visit (HOSPITAL_COMMUNITY): Payer: Self-pay | Admitting: Pulmonary Disease

## 2017-08-20 DIAGNOSIS — R609 Edema, unspecified: Secondary | ICD-10-CM

## 2017-08-20 DIAGNOSIS — R490 Dysphonia: Secondary | ICD-10-CM | POA: Diagnosis not present

## 2017-08-21 ENCOUNTER — Ambulatory Visit (HOSPITAL_COMMUNITY)
Admission: RE | Admit: 2017-08-21 | Discharge: 2017-08-21 | Disposition: A | Discharge status: Home / Self Care | Payer: Medicare Other | Source: Ambulatory Visit | Attending: Pulmonary Disease | Admitting: Pulmonary Disease

## 2017-08-21 DIAGNOSIS — R609 Edema, unspecified: Secondary | ICD-10-CM

## 2017-08-21 DIAGNOSIS — Z853 Personal history of malignant neoplasm of breast: Secondary | ICD-10-CM | POA: Diagnosis not present

## 2017-08-21 DIAGNOSIS — E785 Hyperlipidemia, unspecified: Secondary | ICD-10-CM | POA: Insufficient documentation

## 2017-08-21 DIAGNOSIS — Z9012 Acquired absence of left breast and nipple: Secondary | ICD-10-CM | POA: Diagnosis not present

## 2017-08-21 DIAGNOSIS — I080 Rheumatic disorders of both mitral and aortic valves: Secondary | ICD-10-CM | POA: Insufficient documentation

## 2017-08-21 DIAGNOSIS — I10 Essential (primary) hypertension: Secondary | ICD-10-CM | POA: Diagnosis not present

## 2017-08-21 LAB — ECHOCARDIOGRAM COMPLETE
AO mean calculated velocity dopler: 137 cm/s
AOPV: 0.64 m/s
AOVTI: 44.3 cm
AV Area VTI: 1.64 cm2
AV Area mean vel: 1.64 cm2
AV Peak grad: 17 mmHg
AV VEL mean LVOT/AV: 0.64
AV area mean vel ind: 0.85 cm2/m2
AV peak Index: 0.85
AVAREAVTIIND: 0.94 cm2/m2
AVG: 9 mmHg
AVLVOTPG: 7 mmHg
AVPHT: 744 ms
AVPKVEL: 205 cm/s
CHL CUP AV VEL: 1.81
E decel time: 236 msec
E/e' ratio: 18.73
FS: 49 % — AB (ref 28–44)
IV/PV OW: 1.21
LA diam index: 1.55 cm/m2
LASIZE: 30 mm
LAVOL: 55.9 mL
LAVOLA4C: 52.8 mL
LAVOLIN: 29 mL/m2
LEFT ATRIUM END SYS DIAM: 30 mm
LV E/e' medial: 18.73
LV TDI E'LATERAL: 5.98
LV e' LATERAL: 5.98 cm/s
LV sys vol: 13 mL — AB (ref 14–42)
LVDIAVOL: 47 mL (ref 46–106)
LVDIAVOLIN: 24 mL/m2
LVEEAVG: 18.73
LVOT VTI: 31.6 cm
LVOT area: 2.54 cm2
LVOT diameter: 18 mm
LVOT peak VTI: 0.71 cm
LVOT peak vel: 132 cm/s
LVOTSV: 80 mL
LVSYSVOLIN: 7 mL/m2
MV Dec: 236
MV pk A vel: 129 m/s
MVPG: 5 mmHg
MVPKEVEL: 112 m/s
PW: 13 mm — AB (ref 0.6–1.1)
RV LATERAL S' VELOCITY: 11.6 cm/s
Simpson's disk: 73
Stroke v: 34 ml
TAPSE: 16.6 mm
TDI e' medial: 7.4
Valve area index: 0.94
Valve area: 1.81 cm2

## 2017-08-21 NOTE — Progress Notes (Signed)
*  PRELIMINARY RESULTS* Echocardiogram 2D Echocardiogram has been performed.  Tammy Terry 08/21/2017, 3:06 PM

## 2018-02-18 ENCOUNTER — Ambulatory Visit (INDEPENDENT_AMBULATORY_CARE_PROVIDER_SITE_OTHER): Payer: Medicare Other | Admitting: Otolaryngology

## 2018-02-18 DIAGNOSIS — R490 Dysphonia: Secondary | ICD-10-CM

## 2018-02-18 DIAGNOSIS — K219 Gastro-esophageal reflux disease without esophagitis: Secondary | ICD-10-CM

## 2018-02-28 ENCOUNTER — Emergency Department (HOSPITAL_COMMUNITY)
Admission: EM | Admit: 2018-02-28 | Discharge: 2018-02-28 | Disposition: A | Discharge status: Home / Self Care | Payer: Medicare Other | Attending: Emergency Medicine | Admitting: Emergency Medicine

## 2018-02-28 ENCOUNTER — Encounter (HOSPITAL_COMMUNITY): Payer: Self-pay | Admitting: Emergency Medicine

## 2018-02-28 ENCOUNTER — Emergency Department (HOSPITAL_COMMUNITY): Payer: Medicare Other

## 2018-02-28 ENCOUNTER — Other Ambulatory Visit: Payer: Self-pay

## 2018-02-28 DIAGNOSIS — S42018A Nondisplaced fracture of sternal end of left clavicle, initial encounter for closed fracture: Secondary | ICD-10-CM | POA: Diagnosis not present

## 2018-02-28 DIAGNOSIS — W109XXA Fall (on) (from) unspecified stairs and steps, initial encounter: Secondary | ICD-10-CM | POA: Insufficient documentation

## 2018-02-28 DIAGNOSIS — S0003XA Contusion of scalp, initial encounter: Secondary | ICD-10-CM

## 2018-02-28 DIAGNOSIS — Y92009 Unspecified place in unspecified non-institutional (private) residence as the place of occurrence of the external cause: Secondary | ICD-10-CM | POA: Insufficient documentation

## 2018-02-28 DIAGNOSIS — Z23 Encounter for immunization: Secondary | ICD-10-CM | POA: Insufficient documentation

## 2018-02-28 DIAGNOSIS — M25562 Pain in left knee: Secondary | ICD-10-CM | POA: Diagnosis not present

## 2018-02-28 DIAGNOSIS — Y9301 Activity, walking, marching and hiking: Secondary | ICD-10-CM | POA: Diagnosis not present

## 2018-02-28 DIAGNOSIS — Y999 Unspecified external cause status: Secondary | ICD-10-CM | POA: Diagnosis not present

## 2018-02-28 DIAGNOSIS — Z79899 Other long term (current) drug therapy: Secondary | ICD-10-CM | POA: Insufficient documentation

## 2018-02-28 DIAGNOSIS — I10 Essential (primary) hypertension: Secondary | ICD-10-CM | POA: Insufficient documentation

## 2018-02-28 DIAGNOSIS — Z7982 Long term (current) use of aspirin: Secondary | ICD-10-CM | POA: Insufficient documentation

## 2018-02-28 DIAGNOSIS — S0990XA Unspecified injury of head, initial encounter: Secondary | ICD-10-CM | POA: Diagnosis present

## 2018-02-28 MED ORDER — BACITRACIN ZINC 500 UNIT/GM EX OINT
1.0000 | TOPICAL_OINTMENT | Freq: Two times a day (BID) | CUTANEOUS | Status: DC
Start: 2018-02-28 — End: 2018-03-01
  Administered 2018-02-28: 1 via TOPICAL

## 2018-02-28 MED ORDER — BACITRACIN ZINC 500 UNIT/GM EX OINT
TOPICAL_OINTMENT | CUTANEOUS | Status: AC
  Administered 2018-02-28: 1 via TOPICAL
  Filled 2018-02-28: qty 1.8

## 2018-02-28 MED ORDER — IBUPROFEN 400 MG PO TABS: 400.0000 mg | ORAL_TABLET | Freq: Four times a day (QID) | ORAL | 0 refills | Status: DC | PRN

## 2018-02-28 MED ORDER — IBUPROFEN 400 MG PO TABS
400.0000 mg | ORAL_TABLET | Freq: Once | ORAL | Status: AC
  Administered 2018-02-28: 400 mg via ORAL
  Filled 2018-02-28: qty 1

## 2018-02-28 MED ORDER — TETANUS-DIPHTH-ACELL PERTUSSIS 5-2.5-18.5 LF-MCG/0.5 IM SUSP
0.5000 mL | Freq: Once | INTRAMUSCULAR | Status: AC
  Administered 2018-02-28: 0.5 mL via INTRAMUSCULAR
  Filled 2018-02-28: qty 0.5

## 2018-02-28 NOTE — ED Triage Notes (Signed)
Patient was attempting to climb outside stairs and fell, unsure about time and how she landed.  Complaints of neck pain, left collar bone pain, bruise to front left shin.

## 2018-02-28 NOTE — Discharge Instructions (Addendum)
You have a broken collarbone, it is very minimally evident on x-rays and was actually only seen on the CT scan and not the regular x-ray.  Please wear the sling for comfort, you will need to follow-up with your doctor within the next week.  You do not have any other broken bones.  Please keep antibiotic ointment on all of your open wounds, seek medical attention for increasing pain swelling bleeding or spreading redness or fever  Please talk to Dr. Luan Pulling about your heart murmur, you may need repeat evaluation if it is louder than it was in the past

## 2018-02-28 NOTE — ED Provider Notes (Signed)
[facility] EMERGENCY DEPARTMENT Provider Note   CSN: [id number] Arrival date & time: 02/28/18  1947     History   Chief Complaint Chief Complaint  Patient presents with  . Fall    HPI Tammy Terry is a 77 y.o. adult.  HPI 77 year old female, she presents after having a fall, states that she was walking up brick steps at her house, lost her balance and fell backwards landing on her left side including her shoulder and her head, she has pain in the left clavicle, the left forehead and the left knee.  Multiple skin tears, bruising around the left knee.  This occurred just prior to arrival, symptoms are persistent, moderate, worse with movement.  Not associated with loss of consciousness or seizures or vomiting.  Her husband was present at the time, witnessed the whole event and agrees with the above report.  Past Medical History:  Diagnosis Date  . Age-related osteoporosis without current pathological fracture   . Anxiety   . Cancer ([place]) 1994   BREAST CANCER, TREATED WITH RADIATION AND CHEMOTHERAPY  . Depression with anxiety 03/01/2012  . Fatty liver   . H/O vitamin D deficiency   . Hyperlipidemia 03/01/2012  . Hypertension   . Osteoporosis   . Rosacea     Patient Active Problem List   Diagnosis Date Noted  . Osteoporosis   . Abdominal distention 02/03/2015  . Osteopenia 03/18/2013  . Essential hypertension, benign 10/22/2012  . Fatty liver 10/22/2012  . Elevated liver enzymes 10/22/2012  . Breast cancer, stage 3 ([place]) 03/01/2012  . Depression with anxiety 03/01/2012  . Hyperlipidemia 03/01/2012    Past Surgical History:  Procedure Laterality Date  . BREAST SURGERY  1994   LEFT MASTECTOMY   . CAPSULOTOMY  august 2015   of left eye  . CATARACT EXTRACTION Left   . CESAREAN SECTION    . COLONOSCOPY N/A 05/13/2014   Procedure: COLONOSCOPY;  Surgeon: Rogene Houston, MD;  Location[facility];  Service: Endoscopy;  Laterality: N/A;  930  . MASTECTOMY     left  breast for cancer in 1994  . TONSILLECTOMY       OB History    Gravida  1   Para  1   Term      Preterm      AB      Living  1     SAB      TAB      Ectopic      Multiple      Live Births               Home Medications    Prior to Admission medications   Medication Sig Start Date End Date Taking? Authorizing Provider  aspirin 81 MG chewable tablet Chew 162 mg by mouth daily.   Yes [provider]  clonazePAM (KLONOPIN) 0.5 MG tablet Take 0.25 mg by mouth 4 (four) times daily as needed for anxiety.    Yes [provider]  levETIRAcetam (KEPPRA) 500 MG tablet Take 500 mg by mouth 2 (two) times daily.   Yes [provider]  losartan (COZAAR) 50 MG tablet Take 50 mg by mouth daily.   Yes [provider]  polyethylene glycol (MIRALAX) packet Take 1 packet by mouth daily as needed.    Yes [provider]  venlafaxine (EFFEXOR) 75 MG tablet Take 225 mg by mouth daily.    Yes [provider]  ibuprofen (ADVIL,MOTRIN) 400  MG tablet Take 1 tablet (400 mg total) by mouth every 6 (six) hours as needed. 02/28/18   Noemi Chapel, MD    Family History Family History  Problem Relation Age of Onset  . Heart attack Father   . Diabetes Paternal Grandmother     Social History Social History   Tobacco Use  . Smoking status: Never Smoker  Substance Use Topics  . Alcohol use: No    Alcohol/week: 0.0 standard drinks  . Drug use: No     Allergies   Amoxicillin and Codeine   Review of Systems Review of Systems  All other systems reviewed and are negative.    Physical Exam Updated Vital Signs BP 136/61   Pulse 87   Temp 98.4 F (36.9 C) (Oral)   Resp 18   Ht 1.676 m (5\' 6" )   Wt 79.4 kg   SpO2 97%   BMI 28.25 kg/m   Physical Exam  Constitutional: She appears well-developed and well-nourished. No distress.  HENT:  Head: Normocephalic.  Mouth/Throat: Oropharynx is clear and moist. No oropharyngeal  exudate.  Small hematoma and abrasion to the left forehead  Eyes: Pupils are equal, round, and reactive to light. Conjunctivae and EOM are normal. Right eye exhibits no discharge. Left eye exhibits no discharge. No scleral icterus.  Neck: Normal range of motion. Neck supple. No JVD present. No thyromegaly present.  Cardiovascular: Normal rate, regular rhythm and intact distal pulses. Exam reveals no gallop and no friction rub.  Murmur ( Moderate systolic murmur) heard. Pulmonary/Chest: Effort normal and breath sounds normal. No respiratory distress. She has no wheezes. She has no rales.  Abdominal: Soft. Bowel sounds are normal. She exhibits no distension and no mass. There is no tenderness.  Musculoskeletal: Normal range of motion. She exhibits tenderness ( Tenderness over the left clavicle with mild bruising, normal range of motion of all 4 extremities otherwise.). She exhibits no edema.  Bruising and tenderness over the left knee and the left lower extremity, anterior mid leg.  No active bleeding  Lymphadenopathy:    She has no cervical adenopathy.  Neurological: She is alert. Coordination normal.  Follows commands without difficulty, straight leg raise bilaterally with normal strength, normal coordination, normal mental status.  Bilateral upper extremities with normal strength coordination and sensation.  Skin: Skin is warm and dry. No rash noted. No erythema.  Possible small skin tears, bruising  Psychiatric: She has a normal mood and affect. Her behavior is normal.  Nursing note and vitals reviewed.    ED Treatments / Results  Labs (all labs ordered are listed, but only abnormal results are displayed) Labs Reviewed - No data to display  EKG EKG Interpretation  Date/Time:  Thursday February 28 2018 19:56:41 EDT Ventricular Rate:  93 PR Interval:    QRS Duration: 75 QT Interval:  357 QTC Calculation: 444 R Axis:   -75 Text Interpretation:  Sinus rhythm Left anterior  fascicular block Abnormal R-wave progression, early transition Probable LVH with secondary repol abnrm Anterior Q waves, possibly due to LVH ST abnormlaities laterally - c/w LVH and repol Confirmed by Noemi Chapel [phone]) on 02/28/2018 8:47:18 PM   Radiology Dg Clavicle Left  Result Date: 02/28/2018 CLINICAL DATA:  Patient states she was climbing some stairs outside and fell. She is now having left clavicle pain. Hx of osteoporosis and left mastectomy. EXAM: LEFT CLAVICLE - 2+ VIEWS COMPARISON:  None. FINDINGS: There is no acute fracture or subluxation. Surgical clips are identified in the LEFT  axillary region. The visualized portion of the LEFT scapula and humerus are normal. IMPRESSION: No evidence for acute  abnormality. Electronically Signed   By: Nolon Nations M.D.   On: 02/28/2018 22:12   Ct Head Wo Contrast  Result Date: 02/28/2018 CLINICAL DATA:  [place] [address] down stairs. Neck pain. Headache. EXAM: CT HEAD WITHOUT CONTRAST CT CERVICAL SPINE WITHOUT CONTRAST TECHNIQUE: Multidetector CT imaging of the head and cervical spine was performed following the standard protocol without intravenous contrast. Multiplanar CT image reconstructions of the cervical spine were also generated. COMPARISON:  MRI brain 06/13/2016 FINDINGS: CT HEAD FINDINGS Brain: Diffuse cerebral atrophy. Ventricular dilatation likely due to central atrophy. Low-attenuation changes throughout the deep white matter likely due to small vessel ischemic changes. No evidence of acute infarction, hemorrhage, hydrocephalus, extra-axial collection or mass lesion/mass effect. Vascular: Moderate intracranial arterial calcifications are present. Skull: Calvarium appears intact. Sinuses/Orbits: Paranasal sinuses and mastoid air cells are clear. Other: None. CT CERVICAL SPINE FINDINGS Alignment: Normal alignment of the cervical vertebrae and facet joints. C1-2 articulation appears intact. Skull base and vertebrae: Skull base appears intact. No vertebral  compression deformities. No focal bone lesion or bone destruction. Bone cortex appears intact. Soft tissues and spinal canal: No prevertebral soft tissue swelling. No abnormal paraspinal soft tissue mass or infiltration. Disc levels: Degenerative changes throughout the cervical spine with narrowed interspaces and endplate hypertrophic changes throughout. Degenerative changes in the facet joints. Upper chest: Lung apices are clear. Azygos lobe. There is a hematoma in the left medial periclavicular region with cortical irregularity demonstrated in the visualized portion of the left clavicle. There is likely a left clavicular fracture, incompletely included within this study. Other: Vascular calcifications. IMPRESSION: 1. Head CT: No acute intracranial abnormalities. Chronic atrophy and small vessel ischemic changes. 2. CERVICAL SPINE: Normal alignment of the cervical spine. Diffuse degenerative changes. No acute displaced cervical spine fractures identified. 3. Probable left clavicular fracture with surrounding hematoma, incompletely included within this study. Electronically Signed   By: Lucienne Capers M.D.   On: 02/28/2018 21:59   Ct Cervical Spine Wo Contrast  Result Date: 02/28/2018 CLINICAL DATA:  [place] [address] down stairs. Neck pain. Headache. EXAM: CT HEAD WITHOUT CONTRAST CT CERVICAL SPINE WITHOUT CONTRAST TECHNIQUE: Multidetector CT imaging of the head and cervical spine was performed following the standard protocol without intravenous contrast. Multiplanar CT image reconstructions of the cervical spine were also generated. COMPARISON:  MRI brain 06/13/2016 FINDINGS: CT HEAD FINDINGS Brain: Diffuse cerebral atrophy. Ventricular dilatation likely due to central atrophy. Low-attenuation changes throughout the deep white matter likely due to small vessel ischemic changes. No evidence of acute infarction, hemorrhage, hydrocephalus, extra-axial collection or mass lesion/mass effect. Vascular: Moderate intracranial  arterial calcifications are present. Skull: Calvarium appears intact. Sinuses/Orbits: Paranasal sinuses and mastoid air cells are clear. Other: None. CT CERVICAL SPINE FINDINGS Alignment: Normal alignment of the cervical vertebrae and facet joints. C1-2 articulation appears intact. Skull base and vertebrae: Skull base appears intact. No vertebral compression deformities. No focal bone lesion or bone destruction. Bone cortex appears intact. Soft tissues and spinal canal: No prevertebral soft tissue swelling. No abnormal paraspinal soft tissue mass or infiltration. Disc levels: Degenerative changes throughout the cervical spine with narrowed interspaces and endplate hypertrophic changes throughout. Degenerative changes in the facet joints. Upper chest: Lung apices are clear. Azygos lobe. There is a hematoma in the left medial periclavicular region with cortical irregularity demonstrated in the visualized portion of the left clavicle. There is likely a left clavicular fracture, incompletely included  within this study. Other: Vascular calcifications. IMPRESSION: 1. Head CT: No acute intracranial abnormalities. Chronic atrophy and small vessel ischemic changes. 2. CERVICAL SPINE: Normal alignment of the cervical spine. Diffuse degenerative changes. No acute displaced cervical spine fractures identified. 3. Probable left clavicular fracture with surrounding hematoma, incompletely included within this study. Electronically Signed   By: Lucienne Capers M.D.   On: 02/28/2018 21:59   Dg Shoulder Left  Result Date: 02/28/2018 CLINICAL DATA:  Patient states she was climbing some stairs outside and fell. She is now having left clavicle / shoulder pain. Hx of osteoporosis and left mastectomy. Unable to perform axillary view due to pain. EXAM: LEFT SHOULDER - 2+ VIEW COMPARISON:  None. FINDINGS: No acute fracture or subluxation. Surgical clips are identified in the LEFT axillary region. Lung apex is clear. IMPRESSION: No  evidence for acute  abnormality. Electronically Signed   By: Nolon Nations M.D.   On: 02/28/2018 22:13   Dg Knee Complete 4 Views Left  Result Date: 02/28/2018 CLINICAL DATA:  Patient states she was climbing some stairs outside and fell. She is now having left knee pain. Hx of osteoporosis. EXAM: LEFT KNEE - COMPLETE 4+ VIEW COMPARISON:  11/12/2013 FINDINGS: No evidence of fracture, dislocation, or joint effusion. No evidence of arthropathy or other focal bone abnormality. Soft tissues are unremarkable. IMPRESSION: Negative. Electronically Signed   By: Nolon Nations M.D.   On: 02/28/2018 22:14    Procedures Procedures (including critical care time)  Medications Ordered in ED Medications  Tdap (BOOSTRIX) injection 0.5 mL (0.5 mLs Intramuscular Given 02/28/18 2050)  ibuprofen (ADVIL,MOTRIN) tablet 400 mg (400 mg Oral Given 02/28/18 2231)     Initial Impression / Assessment and Plan / ED Course  I have reviewed the triage vital signs and the nursing notes.  Pertinent labs & imaging results that were available during my care of the patient were reviewed by me and considered in my medical decision making (see chart for details).  Clinical Course as of Mar 01 1518  Thu Feb 28, 2018  2220 Has clavicular fracture Sling placed by nursing - I check NV status after placement - is good. Pt informed of neg w/u otherwise.  Stable for d/c.   [BM]    Clinical Course User Index [BM] Noemi Chapel, MD    The patient has had an accidental fall, she has multiple small injuries, will rule out clavicular fracture, brain injury, cervical spine injury.  Otherwise she has a normal mental status, she is not anticoagulated, otherwise appears stable.  Final Clinical Impressions(s) / ED Diagnoses   Final diagnoses:  Closed nondisplaced fracture of sternal end of left clavicle, initial encounter  Contusion of scalp, initial encounter    ED Discharge Orders         Ordered    ibuprofen (ADVIL,MOTRIN)  400 MG tablet  Every 6 hours PRN     02/28/18 2222           Noemi Chapel, MD 03/01/18 1520

## 2018-03-25 ENCOUNTER — Ambulatory Visit (INDEPENDENT_AMBULATORY_CARE_PROVIDER_SITE_OTHER): Payer: Medicare Other

## 2018-03-25 ENCOUNTER — Ambulatory Visit: Payer: Medicare Other | Admitting: Orthopedic Surgery

## 2018-03-25 VITALS — BP 130/80 | HR 76 | Ht 66.0 in | Wt 155.0 lb

## 2018-03-25 DIAGNOSIS — S4362XA Sprain of left sternoclavicular joint, initial encounter: Secondary | ICD-10-CM | POA: Diagnosis not present

## 2018-03-25 DIAGNOSIS — S42002A Fracture of unspecified part of left clavicle, initial encounter for closed fracture: Secondary | ICD-10-CM

## 2018-03-25 DIAGNOSIS — L03116 Cellulitis of left lower limb: Secondary | ICD-10-CM

## 2018-03-25 MED ORDER — SULFAMETHOXAZOLE-TRIMETHOPRIM 400-80 MG PO TABS
1.0000 | ORAL_TABLET | Freq: Two times a day (BID) | ORAL | 1 refills | Status: DC
Start: 2018-03-25 — End: 2018-04-03

## 2018-03-25 NOTE — Progress Notes (Signed)
Tammy Terry  03/25/2018  HISTORY SECTION :  Chief Complaint  Patient presents with  . Clavicle Injury    Er follow up on left clavicle fracture, DOI 02-28-18. Referred by Dr. Luan Pulling.   HPI The patient presents for evaluation of injury left collarbone.  The patient fell 25 days ago her x-ray was negative but she had a CT scan because of the fall and there is mention that there is a hematoma over the medial portion of the clavicle near the sternoclavicular joint but the CT scan is incomplete in terms of evaluating the sternoclavicular joint or clavicle for that matter.  They suspect there may be a fracture.  She is been ambulating with a walker using her arms normally with minimal discomfort  Location the sternoclavicular joint and medial clavicle left shoulder Duration 25 days Quality dull ache Severity mild Associated with no numbness or tingling or shortness of breath or swallowing problems   Review of Systems  Respiratory: Negative for shortness of breath.   Gastrointestinal: Negative for heartburn.       Dysphasia negative  Musculoskeletal: Positive for joint pain.  Neurological: Negative for tingling, tremors and sensory change.  Psychiatric/Behavioral: Positive for memory loss.  All other systems reviewed and are negative.   Past Medical History:  Diagnosis Date  . Age-related osteoporosis without current pathological fracture   . Anxiety   . Cancer ([place]) 1994   BREAST CANCER, TREATED WITH RADIATION AND CHEMOTHERAPY  . Depression with anxiety 03/01/2012  . Fatty liver   . H/O vitamin D deficiency   . Hyperlipidemia 03/01/2012  . Hypertension   . Osteoporosis   . Rosacea     Past Surgical History:  Procedure Laterality Date  . BREAST SURGERY  1994   LEFT MASTECTOMY   . CAPSULOTOMY  august 2015   of left eye  . CATARACT EXTRACTION Left   . CESAREAN SECTION    . COLONOSCOPY N/A 05/13/2014   Procedure: COLONOSCOPY;  Surgeon: Rogene Houston, MD;  [facility];  Service: Endoscopy;  Laterality: N/A;  930  . MASTECTOMY     left breast for cancer in 1994  . TONSILLECTOMY      Social History   Tobacco Use  . Smoking status: Never Smoker  Substance Use Topics  . Alcohol use: No    Alcohol/week: 0.0 standard drinks  . Drug use: No    Family History  Problem Relation Age of Onset  . Heart attack Father   . Diabetes Paternal Grandmother       Allergies  Allergen Reactions  . Amoxicillin   . Codeine      Current Outpatient Medications:  .  aspirin 81 MG chewable tablet, Chew 162 mg by mouth daily., Disp: , Rfl:  .  clonazePAM (KLONOPIN) 0.5 MG tablet, Take 0.25 mg by mouth 4 (four) times daily as needed for anxiety. , Disp: , Rfl:  .  ibuprofen (ADVIL,MOTRIN) 400 MG tablet, Take 1 tablet (400 mg total) by mouth every 6 (six) hours as needed., Disp: 30 tablet, Rfl: 0 .  levETIRAcetam (KEPPRA) 500 MG tablet, Take 500 mg by mouth 2 (two) times daily., Disp: , Rfl:  .  losartan (COZAAR) 50 MG tablet, Take 50 mg by mouth daily., Disp: , Rfl:  .  polyethylene glycol (MIRALAX) packet, Take 1 packet by mouth daily as needed. , Disp: , Rfl:  .  venlafaxine (EFFEXOR) 75 MG tablet, Take 225 mg by mouth daily. , Disp: ,  Rfl:    PHYSICAL EXAM SECTION: BP 130/80   Pulse 76   Ht 5\' 6"  (1.676 m)   Wt 155 lb (70.3 kg)   BMI 25.02 kg/m   Body mass index is 25.02 kg/m. General appearance: Well-developed well-nourished no gross deformities  Eyes clear normal vision no evidence of conjunctivitis or jaundice, extraocular muscles intact  ENT: ears hearing normal, nasal passages clear, throat clear   Lymph nodes: No lymphadenopathy  Neck is supple without palpable mass, decreased flexion extension rotation appears to be chronic.   Cardiovascular normal pulse and perfusion in all 4 extremities normal color without edema  Neurologically deep tendon reflexes are equal and normal, no sensation loss or deficits no pathologic  reflexes  Psychological: Awake alert and oriented x3 mood and affect normal  Skin skin exam upper extremities normal right lower extremity normal left lower extremity distal cellulitis see media for picture small ulcer.  This started when she fell she is been treating it with dressing changes and Neosporin   Musculoskeletal: Right and left upper extremity show no motion deficits no tenderness no instability and normal muscle strength and tone.  However on the left lower extremity again we see a area of cellulitis and an ulcer  As far as the left shoulder goes she has full range of motion with pain only reaching across her chest and with the arm in extension she has tenderness over the sternoclavicular joint with no evidence of mobility but a swollen area compared to the right  No swallowing deficits    MEDICAL DECISION SECTION:  Encounter Diagnoses  Name Primary?  . Sternoclavicular (joint) (ligament) sprain, left, initial encounter Yes  . Cellulitis of leg, left     Imaging Regular x-ray shows no fracture  CT scan of the neck does not completely show the sternoclavicular joint other than the hematoma  New films of the shoulder i.e. clavicle are negative  Plan:  (Rx., Inj., surg., Frx, MRI/CT, XR:2)  She probably has a sprain sternoclavicular joint and I would treat her as such with skillful neglect that she is asymptomatic I have advised her about it.  We will start her on Bactrim for the cellulitis and continue with dressing changes including home help if available  Meds ordered this encounter  Medications  . sulfamethoxazole-trimethoprim (BACTRIM) 400-80 MG tablet    Sig: Take 1 tablet by mouth 2 (two) times daily.    Dispense:  14 tablet    Refill:  1    9:34 AM

## 2018-03-25 NOTE — Patient Instructions (Signed)
Dressing changes: Change daily place gauze on the wound followed by Kerlix wrapping  Start Bactrim 1 twice a day  Home health will be called to do dressing changes expect to receive a call to set it up at your house  Return in 1 week to check the wound

## 2018-03-26 ENCOUNTER — Telehealth: Payer: Self-pay | Admitting: Orthopedic Surgery

## 2018-03-26 DIAGNOSIS — L03116 Cellulitis of left lower limb: Secondary | ICD-10-CM

## 2018-03-26 NOTE — Telephone Encounter (Signed)
Patient called, initially to check status of home health referral. I assured her that the referral was made (as noted, to [facility]) and that she should be receiving a call within the next couple of days. She also has questions about dressing and bandages. Please call Home ph# [phone].  Asked for [place] to remove her cell# from records, as she never uses it (done.)

## 2018-03-27 ENCOUNTER — Telehealth: Payer: Self-pay | Admitting: Radiology

## 2018-03-27 NOTE — Telephone Encounter (Signed)
[facility] will not accept for wound care, can we try to send for outpatient?

## 2018-03-27 NOTE — Telephone Encounter (Signed)
Unfortunately her home health referral was not accepted, they do not have staff available. I have asked Dr Aline Brochure about outpatient.

## 2018-03-27 NOTE — Telephone Encounter (Signed)
-----   Message from [facility] sent at 03/25/2018  5:19 PM EDT ----- I am sorry Amy, due to staffing I am unable to accept Mrs. Minnich at this time.  Thank you for considering [facility]. [name] ----- Message ----- From: Candice Camp, RT Sent: 03/25/2018   9:43 AM EDT To: Romualdo Bolk  Dr Aline Brochure put in Wound care orders for left lower leg, can you make sure this patient is set up, orders are in Epic. Please respond to this message so I know this is received and patient has been taken care of.

## 2018-03-28 NOTE — Telephone Encounter (Signed)
Sent the referral over to outpatient therapy she is not sure if she can go but she will try. She will continue the dressing changes with neosporin.

## 2018-03-28 NOTE — Telephone Encounter (Signed)
Have sent wound care orders to outpatient therapy to see if they can help her.

## 2018-04-01 ENCOUNTER — Ambulatory Visit: Payer: Medicare Other | Admitting: Orthopedic Surgery

## 2018-04-01 ENCOUNTER — Encounter (HOSPITAL_COMMUNITY): Payer: Self-pay

## 2018-04-01 ENCOUNTER — Observation Stay (HOSPITAL_COMMUNITY): Payer: Medicare Other

## 2018-04-01 ENCOUNTER — Inpatient Hospital Stay (HOSPITAL_COMMUNITY)
Admission: AD | Admit: 2018-04-01 | Discharge: 2018-04-03 | DRG: 603 | Disposition: A | Discharge status: Home Health | Payer: Medicare Other | Source: Ambulatory Visit | Attending: Pulmonary Disease | Admitting: Pulmonary Disease

## 2018-04-01 ENCOUNTER — Telehealth: Payer: Self-pay | Admitting: Radiology

## 2018-04-01 ENCOUNTER — Other Ambulatory Visit: Payer: Self-pay | Admitting: Orthopedic Surgery

## 2018-04-01 ENCOUNTER — Other Ambulatory Visit: Payer: Self-pay

## 2018-04-01 VITALS — BP 132/72 | HR 90 | Ht 66.0 in | Wt 155.0 lb

## 2018-04-01 DIAGNOSIS — G8929 Other chronic pain: Secondary | ICD-10-CM | POA: Diagnosis present

## 2018-04-01 DIAGNOSIS — L03116 Cellulitis of left lower limb: Secondary | ICD-10-CM

## 2018-04-01 DIAGNOSIS — S81802D Unspecified open wound, left lower leg, subsequent encounter: Secondary | ICD-10-CM | POA: Diagnosis not present

## 2018-04-01 DIAGNOSIS — I10 Essential (primary) hypertension: Secondary | ICD-10-CM | POA: Diagnosis present

## 2018-04-01 DIAGNOSIS — Z95828 Presence of other vascular implants and grafts: Secondary | ICD-10-CM

## 2018-04-01 DIAGNOSIS — F418 Other specified anxiety disorders: Secondary | ICD-10-CM | POA: Diagnosis present

## 2018-04-01 DIAGNOSIS — G40909 Epilepsy, unspecified, not intractable, without status epilepticus: Secondary | ICD-10-CM | POA: Diagnosis present

## 2018-04-01 DIAGNOSIS — Z9012 Acquired absence of left breast and nipple: Secondary | ICD-10-CM | POA: Diagnosis not present

## 2018-04-01 DIAGNOSIS — L03119 Cellulitis of unspecified part of limb: Secondary | ICD-10-CM

## 2018-04-01 DIAGNOSIS — W19XXXD Unspecified fall, subsequent encounter: Secondary | ICD-10-CM | POA: Diagnosis present

## 2018-04-01 DIAGNOSIS — Z923 Personal history of irradiation: Secondary | ICD-10-CM | POA: Diagnosis not present

## 2018-04-01 DIAGNOSIS — Z7982 Long term (current) use of aspirin: Secondary | ICD-10-CM

## 2018-04-01 DIAGNOSIS — Z833 Family history of diabetes mellitus: Secondary | ICD-10-CM | POA: Diagnosis not present

## 2018-04-01 DIAGNOSIS — S4362XA Sprain of left sternoclavicular joint, initial encounter: Secondary | ICD-10-CM | POA: Diagnosis not present

## 2018-04-01 DIAGNOSIS — Z8249 Family history of ischemic heart disease and other diseases of the circulatory system: Secondary | ICD-10-CM

## 2018-04-01 DIAGNOSIS — L02416 Cutaneous abscess of left lower limb: Secondary | ICD-10-CM | POA: Diagnosis present

## 2018-04-01 DIAGNOSIS — Z853 Personal history of malignant neoplasm of breast: Secondary | ICD-10-CM

## 2018-04-01 DIAGNOSIS — M549 Dorsalgia, unspecified: Secondary | ICD-10-CM | POA: Diagnosis present

## 2018-04-01 DIAGNOSIS — E785 Hyperlipidemia, unspecified: Secondary | ICD-10-CM | POA: Diagnosis present

## 2018-04-01 LAB — CBC WITH DIFFERENTIAL/PLATELET
Basophils Absolute: 0 10*3/uL (ref 0.0–0.1)
Basophils Relative: 0 %
Eosinophils Absolute: 0 10*3/uL (ref 0.0–0.7)
Eosinophils Relative: 0 %
HCT: 37.2 % (ref 36.0–46.0)
Hemoglobin: 11.8 g/dL — ABNORMAL LOW (ref 12.0–15.0)
LYMPHS ABS: 1.2 10*3/uL (ref 0.7–4.0)
LYMPHS PCT: 17 %
MCH: 30.8 pg (ref 26.0–34.0)
MCHC: 31.7 g/dL (ref 30.0–36.0)
MCV: 97.1 fL (ref 78.0–100.0)
Monocytes Absolute: 0.5 10*3/uL (ref 0.1–1.0)
Monocytes Relative: 7 %
Neutro Abs: 5.3 10*3/uL (ref 1.7–7.7)
Neutrophils Relative %: 76 %
Platelets: 145 10*3/uL — ABNORMAL LOW (ref 150–400)
RBC: 3.83 MIL/uL — AB (ref 3.87–5.11)
RDW: 14.2 % (ref 11.5–15.5)
WBC: 7 10*3/uL (ref 4.0–10.5)

## 2018-04-01 LAB — BASIC METABOLIC PANEL
Anion gap: 9 (ref 5–15)
BUN: 21 mg/dL (ref 8–23)
CHLORIDE: 104 mmol/L (ref 98–111)
CO2: 23 mmol/L (ref 22–32)
Calcium: 9.4 mg/dL (ref 8.9–10.3)
Creatinine, Ser: 1.07 mg/dL — ABNORMAL HIGH (ref 0.44–1.00)
GFR calc Af Amer: 56 mL/min — ABNORMAL LOW (ref >60)
GFR calc non Af Amer: 48 mL/min — ABNORMAL LOW (ref >60)
GLUCOSE: 89 mg/dL (ref 70–99)
POTASSIUM: 4 mmol/L (ref 3.5–5.1)
Sodium: 136 mmol/L (ref 135–145)

## 2018-04-01 MED ORDER — VANCOMYCIN HCL IN DEXTROSE 1-5 GM/200ML-% IV SOLN
1000.0000 mg | INTRAVENOUS | Status: DC
  Administered 2018-04-01 – 2018-04-03 (×3): 1000 mg via INTRAVENOUS
  Filled 2018-04-01 (×2): qty 200

## 2018-04-01 MED ORDER — CLONAZEPAM 0.5 MG PO TABS: 0.2500 mg | ORAL_TABLET | Freq: Four times a day (QID) | ORAL | Status: DC | PRN

## 2018-04-01 MED ORDER — ACETAMINOPHEN 325 MG PO TABS
650.0000 mg | ORAL_TABLET | Freq: Four times a day (QID) | ORAL | Status: DC | PRN
  Administered 2018-04-01 – 2018-04-02 (×2): 650 mg via ORAL
  Filled 2018-04-01 (×2): qty 2

## 2018-04-01 MED ORDER — SODIUM CHLORIDE 0.9% FLUSH
3.0000 mL | Freq: Two times a day (BID) | INTRAVENOUS | Status: DC
  Administered 2018-04-01 – 2018-04-03 (×3): 3 mL via INTRAVENOUS

## 2018-04-01 MED ORDER — TRAMADOL HCL 50 MG PO TABS: 50.0000 mg | ORAL_TABLET | Freq: Four times a day (QID) | ORAL | Status: DC | PRN

## 2018-04-01 MED ORDER — VENLAFAXINE HCL 37.5 MG PO TABS: 225.0000 mg | ORAL_TABLET | Freq: Every day | ORAL | Status: DC

## 2018-04-01 MED ORDER — SODIUM CHLORIDE 0.9 % IV SOLN
250.0000 mL | INTRAVENOUS | Status: DC | PRN
  Administered 2018-04-03: 250 mL via INTRAVENOUS

## 2018-04-01 MED ORDER — ONDANSETRON HCL 4 MG/2ML IJ SOLN
4.0000 mg | Freq: Four times a day (QID) | INTRAMUSCULAR | Status: DC | PRN
  Administered 2018-04-02: 4 mg via INTRAVENOUS
  Filled 2018-04-01: qty 2

## 2018-04-01 MED ORDER — VENLAFAXINE HCL ER 75 MG PO CP24
225.0000 mg | ORAL_CAPSULE | Freq: Every day | ORAL | Status: DC
  Administered 2018-04-02 – 2018-04-03 (×2): 225 mg via ORAL
  Filled 2018-04-01 (×2): qty 3

## 2018-04-01 MED ORDER — HEPARIN SODIUM (PORCINE) 5000 UNIT/ML IJ SOLN
5000.0000 [IU] | Freq: Three times a day (TID) | INTRAMUSCULAR | Status: DC
  Administered 2018-04-01 – 2018-04-02 (×4): 5000 [IU] via SUBCUTANEOUS
  Filled 2018-04-01 (×4): qty 1

## 2018-04-01 MED ORDER — ENOXAPARIN SODIUM 40 MG/0.4ML ~~LOC~~ SOLN: 40.0000 mg | SUBCUTANEOUS | Status: DC

## 2018-04-01 MED ORDER — LEVETIRACETAM 500 MG PO TABS
500.0000 mg | ORAL_TABLET | Freq: Two times a day (BID) | ORAL | Status: DC
  Administered 2018-04-01 – 2018-04-03 (×4): 500 mg via ORAL
  Filled 2018-04-01 (×4): qty 1

## 2018-04-01 MED ORDER — LOSARTAN POTASSIUM 50 MG PO TABS
50.0000 mg | ORAL_TABLET | Freq: Every day | ORAL | Status: DC
  Filled 2018-04-01 (×2): qty 1

## 2018-04-01 MED ORDER — ONDANSETRON HCL 4 MG PO TABS: 4.0000 mg | ORAL_TABLET | Freq: Four times a day (QID) | ORAL | Status: DC | PRN

## 2018-04-01 MED ORDER — CLONAZEPAM 0.5 MG PO TABS
0.2500 mg | ORAL_TABLET | Freq: Four times a day (QID) | ORAL | Status: DC | PRN
  Filled 2018-04-01 (×2): qty 1

## 2018-04-01 MED ORDER — ACETAMINOPHEN 650 MG RE SUPP: 650.0000 mg | Freq: Four times a day (QID) | RECTAL | Status: DC | PRN

## 2018-04-01 MED ORDER — ASPIRIN 81 MG PO CHEW
162.0000 mg | CHEWABLE_TABLET | Freq: Every day | ORAL | Status: DC
  Administered 2018-04-02 – 2018-04-03 (×2): 162 mg via ORAL
  Filled 2018-04-01 (×2): qty 2

## 2018-04-01 MED ORDER — SODIUM CHLORIDE 0.9% FLUSH
3.0000 mL | INTRAVENOUS | Status: DC | PRN
  Administered 2018-04-02: 3 mL via INTRAVENOUS
  Filled 2018-04-01: qty 3

## 2018-04-01 NOTE — Progress Notes (Signed)
Wound on left lower leg assessed and re-dressed with non-adherent pad, ABD pad and gauze.

## 2018-04-01 NOTE — Progress Notes (Signed)
Follow-up old problem  Chief Complaint  Patient presents with  . Follow-up    Recheck on left leg wound. DOI 02-28-18.    77 year old female injured her sternoclavicular joint in September presented to the office a week or so later with a sternoclavicular joint sprain.  At that time she informed us that she had a wound on her left leg she was treated with p.o. antibiotics but did not improve and on today's visit she has worsening cellulitis increased pain and drainage with an open wound which is approximately 1-1/2 cm x 1 cm with 1-1/2 cm depth Pain is mild worse with palpation dull to sharp with palpation   Review of Systems  Constitutional: Negative for chills, fever and weight loss.  Respiratory: Negative for shortness of breath.   Cardiovascular: Negative for chest pain.  Neurological: Negative for tingling.     Past Medical History:  Diagnosis Date  . Age-related osteoporosis without current pathological fracture   . Anxiety   . Cancer ([place]) 1994   BREAST CANCER, TREATED WITH RADIATION AND CHEMOTHERAPY  . Depression with anxiety 03/01/2012  . Fatty liver   . H/O vitamin D deficiency   . Hyperlipidemia 03/01/2012  . Hypertension   . Osteoporosis   . Rosacea     Past Surgical History:  Procedure Laterality Date  . BREAST SURGERY  1994   LEFT MASTECTOMY   . CAPSULOTOMY  august 2015   of left eye  . CATARACT EXTRACTION Left   . CESAREAN SECTION    . COLONOSCOPY N/A 05/13/2014   Procedure: COLONOSCOPY;  Surgeon: Rogene Houston, MD;  [facility];  Service: Endoscopy;  Laterality: N/A;  930  . MASTECTOMY     left breast for cancer in 1994  . TONSILLECTOMY      Family History  Problem Relation Age of Onset  . Heart attack Father   . Diabetes Paternal Grandmother    Social History   Tobacco Use  . Smoking status: Never Smoker  Substance Use Topics  . Alcohol use: No    Alcohol/week: 0.0 standard drinks  . Drug use: No    Allergies  Allergen  Reactions  . Amoxicillin   . Codeine     No outpatient medications have been marked as taking for the 04/01/18 encounter (Office Visit) with Carole Civil, MD.    BP 132/72   Pulse 90   Ht 5\' 6"  (1.676 m)   Wt 155 lb (70.3 kg)   BMI 25.02 kg/m   Physical Exam  Constitutional: She is oriented to person, place, and time. She appears well-developed and well-nourished.  Vital signs have been reviewed and are stable. Gen. appearance the patient is well-developed and well-nourished with normal grooming and hygiene.   Neurological: She is alert and oriented to person, place, and time.  Skin: Skin is warm and dry. No erythema.  Psychiatric: She has a normal mood and affect.  Vitals reviewed.   Ortho Exam The patient is ambulating with a walker.  She has a wound over the left leg with erythema from the knee down to the foot it is tender to palpation she has drainage which is serosanguineous and serous in nature.  It does not affect her range of motion of the ankle or knee which are stable muscle tone and strength are normal the skin is red and flaky she actually has tenderness and redness in both legs pulses are normal she has bilateral edema in the tibial region sensation  is intact bilaterally   MEDICAL DECISION SECTION  Xrays were done at Will be done she is being admitted   Encounter Diagnoses  Name Primary?  . Cellulitis of leg, left Yes  . Sternoclavicular (joint) (ligament) sprain, left, initial encounter     PLAN: (Rx., injectx, surgery, frx, mri/ct) I spoke with Dr. Luan Pulling he agrees on admission.  Will admit to his service and I will be a consult  No orders of the defined types were placed in this encounter.  DIRECT ADMIT FOR UNRESOLVED CELLULITIS    Arther Abbott, MD  04/01/2018 2:50 PM

## 2018-04-01 NOTE — Progress Notes (Signed)
Pharmacy Antibiotic Note  PRITI CONSOLI is a 77 y.o. adult admitted on 04/01/2018 with cellulitis.  Pharmacy has been consulted for Vancomycin dosing.  Plan: Vancomycin 1gm IV every 24 hours.  Goal trough 10-15 mcg/mL.  Monitor labs, micro and vitals.   Height: 5\' 6"  (167.6 cm) Weight: 154 lb 15.7 oz (70.3 kg) IBW/kg (Calculated) : 59.3  Temp (24hrs), Avg:98.5 F (36.9 C), Min:98.5 F (36.9 C), Max:98.5 F (36.9 C)  Recent Labs  Lab 04/01/18 1659  WBC 7.0  CREATININE 1.07*    Estimated Creatinine Clearance (by C-G formula based on SCr of 1.07 mg/dL (H)) Female: 39.9 mL/min (A) Female: 50.5 mL/min (A)    Allergies  Allergen Reactions  . Amoxicillin Other (See Comments)    Unknown reaction  . Codeine     Unknown reaction    Antimicrobials this admission: Vanc 10/7 >>    >>   Dose adjustments this admission: n/a   Microbiology results:  BCx:   UCx:    Sputum:    MRSA PCR:   Thank you for allowing pharmacy to be a part of this patient's care.  Pricilla Larsson 04/01/2018 6:54 PM

## 2018-04-01 NOTE — Telephone Encounter (Signed)
Dr Aline Brochure has asked me to call Dr Luan Pulling concerning patient patient has a wound of her left lower leg and Dr Luan Pulling has stated he will direct admit her for this.  I have called for placement with bed control 300 hall ? Room number. Wait 15 minutes before sending patient over.

## 2018-04-01 NOTE — Progress Notes (Unsigned)
  FOLLOW UP   No chief complaint on file.   HPI  ROS   Past Medical History:  Diagnosis Date  . Age-related osteoporosis without current pathological fracture   . Anxiety   . Cancer ([place]) 1994   BREAST CANCER, TREATED WITH RADIATION AND CHEMOTHERAPY  . Depression with anxiety 03/01/2012  . Fatty liver   . H/O vitamin D deficiency   . Hyperlipidemia 03/01/2012  . Hypertension   . Osteoporosis   . Rosacea     Past Surgical History:  Procedure Laterality Date  . BREAST SURGERY  1994   LEFT MASTECTOMY   . CAPSULOTOMY  august 2015   of left eye  . CATARACT EXTRACTION Left   . CESAREAN SECTION    . COLONOSCOPY N/A 05/13/2014   Procedure: COLONOSCOPY;  Surgeon: Rogene Houston, MD;  [facility];  Service: Endoscopy;  Laterality: N/A;  930  . MASTECTOMY     left breast for cancer in 1994  . TONSILLECTOMY      Family History  Problem Relation Age of Onset  . Heart attack Father   . Diabetes Paternal Grandmother    Social History   Tobacco Use  . Smoking status: Never Smoker  Substance Use Topics  . Alcohol use: No    Alcohol/week: 0.0 standard drinks  . Drug use: No    Allergies  Allergen Reactions  . Amoxicillin   . Codeine     No outpatient medications have been marked as taking for the 04/01/18 encounter (Orders Only) with Carole Civil, MD.    There were no vitals taken for this visit.  Physical Exam  Ortho Exam    MEDICAL DECISION SECTION  Xrays were done at ***  My independent reading of xrays:  ***  No diagnosis found.  PLAN: (Rx., injectx, surgery, frx, mri/ct) ***  No orders of the defined types were placed in this encounter.   Arther Abbott, MD  04/01/2018 3:07 PM

## 2018-04-02 ENCOUNTER — Encounter (HOSPITAL_COMMUNITY): Payer: Self-pay | Admitting: *Deleted

## 2018-04-02 LAB — BASIC METABOLIC PANEL
Anion gap: 6 (ref 5–15)
BUN: 18 mg/dL (ref 8–23)
CALCIUM: 9.2 mg/dL (ref 8.9–10.3)
CHLORIDE: 110 mmol/L (ref 98–111)
CO2: 25 mmol/L (ref 22–32)
CREATININE: 1.07 mg/dL — AB (ref 0.44–1.00)
GFR calc Af Amer: 56 mL/min — ABNORMAL LOW (ref >60)
GFR, EST NON AFRICAN AMERICAN: 48 mL/min — AB (ref >60)
Glucose, Bld: 91 mg/dL (ref 70–99)
Potassium: 4.5 mmol/L (ref 3.5–5.1)
SODIUM: 141 mmol/L (ref 135–145)

## 2018-04-02 NOTE — H&P (Signed)
Tammy Terry is an 77 y.o. adult.   Chief Complaint: Left leg infection HPI: 77 year old female sustained a fall 2 weeks ago injured her medial clavicle at the sternoclavicular joint.  At the same time of the fall she sustained a laceration over her left lower leg.  She presented to the office 1 week after her medial clavicle injury for treatment of her sternoclavicular joint however at that time she was found to have a draining wound which she was self-medicating with dressing changes and Neosporin.  I placed her on oral antibiotics and had a wound check in a week at which time it was found that she was not improving.  She complained of mild pain over the left lower leg draining wound 1-1/2 x 1 cm with 1.5 cm depth surrounding erythema from tibial tubercle down to ankle with tenderness throughout the area of erythema.  She was not febrile she had no malaise she did not feel ill  Past Medical History:  Diagnosis Date  . Age-related osteoporosis without current pathological fracture   . Anxiety   . Cancer ([place]) 1994   BREAST CANCER, TREATED WITH RADIATION AND CHEMOTHERAPY  . Depression with anxiety 03/01/2012  . Fatty liver   . H/O vitamin D deficiency   . Hyperlipidemia 03/01/2012  . Hypertension   . Osteoporosis   . Rosacea     Past Surgical History:  Procedure Laterality Date  . BREAST SURGERY  1994   LEFT MASTECTOMY   . CAPSULOTOMY  august 2015   of left eye  . CATARACT EXTRACTION Left   . CESAREAN SECTION    . COLONOSCOPY N/A 05/13/2014   Procedure: COLONOSCOPY;  Surgeon: Rogene Houston, MD;  [facility];  Service: Endoscopy;  Laterality: N/A;  930  . MASTECTOMY     left breast for cancer in 1994  . TONSILLECTOMY      Family History  Problem Relation Age of Onset  . Heart attack Father   . Diabetes Paternal Grandmother    Social History:  reports that she has never smoked. She has never used smokeless tobacco. She reports that she does not drink alcohol or  use drugs.  Allergies:  Allergies  Allergen Reactions  . Amoxicillin Other (See Comments)    Unknown reaction  . Codeine     Unknown reaction    Medications Prior to Admission  Medication Sig Dispense Refill  . venlafaxine XR (EFFEXOR-XR) 75 MG 24 hr capsule Take 225 mg by mouth daily with breakfast.    . aspirin 81 MG chewable tablet Chew 162 mg by mouth daily.    . clonazePAM (KLONOPIN) 0.5 MG tablet Take 0.25 mg by mouth 4 (four) times daily as needed for anxiety.     Marland Kitchen ibuprofen (ADVIL,MOTRIN) 400 MG tablet Take 1 tablet (400 mg total) by mouth every 6 (six) hours as needed. (Patient not taking: Reported on 04/01/2018) 30 tablet 0  . levETIRAcetam (KEPPRA) 500 MG tablet Take 500 mg by mouth 2 (two) times daily.    Marland Kitchen losartan (COZAAR) 50 MG tablet Take 50 mg by mouth daily.    Marland Kitchen sulfamethoxazole-trimethoprim (BACTRIM) 400-80 MG tablet Take 1 tablet by mouth 2 (two) times daily. (Patient not taking: Reported on 04/01/2018) 14 tablet 1    Results for orders placed or performed during the hospital encounter of 04/01/18 (from the past 48 hour(s))  Basic metabolic panel     Status: Abnormal   Collection Time: 04/01/18  4:59 PM  Result  Value Ref Range   Sodium 136 135 - 145 mmol/L   Potassium 4.0 3.5 - 5.1 mmol/L   Chloride 104 98 - 111 mmol/L   CO2 23 22 - 32 mmol/L   Glucose, Bld 89 70 - 99 mg/dL   BUN 21 8 - 23 mg/dL   Creatinine, Ser 1.07 (H) 0.44 - 1.00 mg/dL   Calcium 9.4 8.9 - 10.3 mg/dL   GFR calc non Af Amer 48 (L) >60 mL/min   GFR calc Af Amer 56 (L) >60 mL/min    Comment: (NOTE) The eGFR has been calculated using the CKD EPI equation. This calculation has not been validated in all clinical situations. eGFR's persistently <60 mL/min signify possible Chronic Kidney Disease.    Anion gap 9 5 - 15    Comment: Performed at [facility], [address], [place], [place] [address]  CBC WITH DIFFERENTIAL     Status: Abnormal   Collection Time: 04/01/18  4:59 PM  Result  Value Ref Range   WBC 7.0 4.0 - 10.5 K/uL   RBC 3.83 (L) 3.87 - 5.11 MIL/uL   Hemoglobin 11.8 (L) 12.0 - 15.0 g/dL   HCT 37.2 36.0 - 46.0 %   MCV 97.1 78.0 - 100.0 fL   MCH 30.8 26.0 - 34.0 pg   MCHC 31.7 30.0 - 36.0 g/dL   RDW 14.2 11.5 - 15.5 %   Platelets 145 (L) 150 - 400 K/uL   Neutrophils Relative % 76 %   Neutro Abs 5.3 1.7 - 7.7 K/uL   Lymphocytes Relative 17 %   Lymphs Abs 1.2 0.7 - 4.0 K/uL   Monocytes Relative 7 %   Monocytes Absolute 0.5 0.1 - 1.0 K/uL   Eosinophils Relative 0 %   Eosinophils Absolute 0.0 0.0 - 0.7 K/uL   Basophils Relative 0 %   Basophils Absolute 0.0 0.0 - 0.1 K/uL    Comment: Performed at [facility], [address], [place], [place] [address]  Basic metabolic panel     Status: Abnormal   Collection Time: 04/02/18  4:45 AM  Result Value Ref Range   Sodium 141 135 - 145 mmol/L   Potassium 4.5 3.5 - 5.1 mmol/L   Chloride 110 98 - 111 mmol/L   CO2 25 22 - 32 mmol/L   Glucose, Bld 91 70 - 99 mg/dL   BUN 18 8 - 23 mg/dL   Creatinine, Ser 1.07 (H) 0.44 - 1.00 mg/dL   Calcium 9.2 8.9 - 10.3 mg/dL   GFR calc non Af Amer 48 (L) >60 mL/min   GFR calc Af Amer 56 (L) >60 mL/min    Comment: (NOTE) The eGFR has been calculated using the CKD EPI equation. This calculation has not been validated in all clinical situations. eGFR's persistently <60 mL/min signify possible Chronic Kidney Disease.    Anion gap 6 5 - 15    Comment: Performed at [facility], [address], [place], [place] [address]   Dg Tibia/fibula Left  Result Date: 04/01/2018 CLINICAL DATA:  [place] [address] Sept 5th , open wound at lower leg that is not healing after dose of antibiotics, admitted today due to that wound EXAM: LEFT TIBIA AND FIBULA - 2 VIEW COMPARISON:  None. FINDINGS: No fracture. No bone lesion. No bone resorption to suggest osteomyelitis. There is a wound along the lateral aspect of the mid leg. There is surrounding subcutaneous soft tissue edema that is seen diffusely. No soft  tissue air. Ankle and knee joints are normally  aligned. IMPRESSION: 1. No fracture or evidence of osteomyelitis. 2. No soft tissue air. Electronically Signed   By: Lajean Manes M.D.   On: 04/01/2018 18:40    Review of Systems  Constitutional: Negative for chills, fever, malaise/fatigue and weight loss.  All other systems reviewed and are negative.   Blood pressure (!) 125/55, pulse 80, temperature 97.8 F (36.6 C), temperature source Oral, resp. rate 16, height '5\' 6"'$  (1.676 m), weight 70.3 kg, SpO2 96 %. Physical Exam  Vitals reviewed. Constitutional: She is oriented to person, place, and time. She appears well-developed and well-nourished. No distress.  Well groomed   HENT:  Head: Normocephalic and atraumatic.  Right Ear: External ear normal.  Left Ear: External ear normal.  Eyes: Pupils are equal, round, and reactive to light. Conjunctivae and lids are normal. Right eye exhibits no discharge. Left eye exhibits no discharge. No scleral icterus. Right pupil is round and reactive. Left pupil is round and reactive. Pupils are equal.  Neck: No tracheal deviation present. No thyroid mass and no thyromegaly present.  Cardiovascular: Normal rate, regular rhythm and intact distal pulses. PMI is not displaced.  Respiratory: No stridor.  Lymphadenopathy:    She has no cervical adenopathy.       Right: No inguinal adenopathy present.       Left: No inguinal adenopathy present.  Neurological: She is alert and oriented to person, place, and time. She has normal reflexes. No cranial nerve deficit or sensory deficit. Coordination normal.  Skin: Skin is warm, dry and intact. No purpura and no rash noted. Rash is not pustular. She is not diaphoretic. There is erythema. No cyanosis. Nails show no clubbing.     Psychiatric: She has a normal mood and affect. Her behavior is normal. Judgment and thought content normal.     Assessment/Plan Cellulitis left leg with draining wound   I spoke with Dr.  Luan Pulling who is agreed to admit the patient on his service for treatment of her cellulitis with plans for PICC line and IV therapy to control the cellulitis and infection  Arther Abbott, MD 04/02/2018, 7:17 AM

## 2018-04-02 NOTE — Progress Notes (Signed)
Patient ID: Tammy Terry, adult   DOB: [birth year], 77 y.o.   MRN: [id number] Day 2 vancomycin  Left leg cellulitis  I see improvement with less erythema and less tenderness   continue iv vanc

## 2018-04-02 NOTE — Consult Note (Signed)
[facility] Nurse wound consult note Reason for Consult: Nonhealing infectious wound to left lateral lower leg.  Has been treated outpatient and now receiving IV vancomycin.  MD notes indicate this has improved.   Wound type:infectious Pressure Injury POA: NA Measurement:2 lesions:  Distal:  2 cmx 1 cm x 0.3 cm Proximal:  1 cm x 1 cm x 0.2 cm  Wound [id number] red Drainage (amount, consistency, odor) minimal serosanguinous  No odor Periwound:erythema, improved and edema Dressing procedure/placement/frequency:Cleanse wounds to left lateral leg with NS.  Apply NS moist Aquacel Ag to wound bed.  Cover with gauze and kerlix. Change Tuesday and Saturday.  Bedside RN to perform.  Will not follow at this time.  Please re-consult if needed.  Domenic Moras MSN, RN, FNP-BC CWON Wound, Ostomy, Continence Nurse Pager [phone]

## 2018-04-02 NOTE — Progress Notes (Signed)
Subjective: She was admitted yesterday from Dr. Ruthe Mannan office.  Dr. Aline Brochure did H&P but admitted her to my service because she has medical problem and because he is expected to be called out of town on a family emergency soon.  She says she feels better.  She has no new complaints.  She did not respond to oral antibiotics.  Objective: Vital signs in last 24 hours: Temp:  [97.8 F (36.6 C)-98.5 F (36.9 C)] 97.8 F (36.6 C) (10/08 0604) Pulse Rate:  [80-90] 80 (10/08 0604) Resp:  [15-18] 16 (10/08 0604) BP: (125-154)/(55-85) 125/55 (10/08 0604) SpO2:  [96 %-100 %] 96 % (10/08 0604) Weight:  [70.3 kg] 70.3 kg (10/07 1601) Weight change:  Last BM Date: (Pt states she doesnt remember)  Intake/Output from previous day: 10/07 0701 - 10/08 0700 In: 174.6 [IV Piggyback:174.6] Out: -   PHYSICAL EXAM General appearance: alert, cooperative and no distress Resp: clear to auscultation bilaterally Cardio: regular rate and rhythm, S1, S2 normal, no murmur, click, rub or gallop GI: soft, non-tender; bowel sounds normal; no masses,  no organomegaly Extremities: She has a wound on the left leg is still has some purulent looking material at the base.  Lab Results:  Results for orders placed or performed during the hospital encounter of 04/01/18 (from the past 48 hour(s))  Basic metabolic panel     Status: Abnormal   Collection Time: 04/01/18  4:59 PM  Result Value Ref Range   Sodium 136 135 - 145 mmol/L   Potassium 4.0 3.5 - 5.1 mmol/L   Chloride 104 98 - 111 mmol/L   CO2 23 22 - 32 mmol/L   Glucose, Bld 89 70 - 99 mg/dL   BUN 21 8 - 23 mg/dL   Creatinine, Ser 1.07 (H) 0.44 - 1.00 mg/dL   Calcium 9.4 8.9 - 10.3 mg/dL   GFR calc non Af Amer 48 (L) >60 mL/min   GFR calc Af Amer 56 (L) >60 mL/min    Comment: (NOTE) The eGFR has been calculated using the CKD EPI equation. This calculation has not been validated in all clinical situations. eGFR's persistently <60 mL/min signify possible  Chronic Kidney Disease.    Anion gap 9 5 - 15    Comment: Performed at [facility], [address], [place], [place] [address]  CBC WITH DIFFERENTIAL     Status: Abnormal   Collection Time: 04/01/18  4:59 PM  Result Value Ref Range   WBC 7.0 4.0 - 10.5 K/uL   RBC 3.83 (L) 3.87 - 5.11 MIL/uL   Hemoglobin 11.8 (L) 12.0 - 15.0 g/dL   HCT 37.2 36.0 - 46.0 %   MCV 97.1 78.0 - 100.0 fL   MCH 30.8 26.0 - 34.0 pg   MCHC 31.7 30.0 - 36.0 g/dL   RDW 14.2 11.5 - 15.5 %   Platelets 145 (L) 150 - 400 K/uL   Neutrophils Relative % 76 %   Neutro Abs 5.3 1.7 - 7.7 K/uL   Lymphocytes Relative 17 %   Lymphs Abs 1.2 0.7 - 4.0 K/uL   Monocytes Relative 7 %   Monocytes Absolute 0.5 0.1 - 1.0 K/uL   Eosinophils Relative 0 %   Eosinophils Absolute 0.0 0.0 - 0.7 K/uL   Basophils Relative 0 %   Basophils Absolute 0.0 0.0 - 0.1 K/uL    Comment: Performed at [facility], [address], [place], [place] [address]  Basic metabolic panel     Status: Abnormal   Collection Time:  04/02/18  4:45 AM  Result Value Ref Range   Sodium 141 135 - 145 mmol/L   Potassium 4.5 3.5 - 5.1 mmol/L   Chloride 110 98 - 111 mmol/L   CO2 25 22 - 32 mmol/L   Glucose, Bld 91 70 - 99 mg/dL   BUN 18 8 - 23 mg/dL   Creatinine, Ser 1.07 (H) 0.44 - 1.00 mg/dL   Calcium 9.2 8.9 - 10.3 mg/dL   GFR calc non Af Amer 48 (L) >60 mL/min   GFR calc Af Amer 56 (L) >60 mL/min    Comment: (NOTE) The eGFR has been calculated using the CKD EPI equation. This calculation has not been validated in all clinical situations. eGFR's persistently <60 mL/min signify possible Chronic Kidney Disease.    Anion gap 6 5 - 15    Comment: Performed at [facility], [address], [place], [place] [address]    ABGS No results for input(s): PHART, PO2ART, TCO2, HCO3 in the last 72 hours.  Invalid input(s): PCO2 CULTURES No results found for this or any previous visit (from the past 240 hour(s)). Studies/Results: Dg Tibia/fibula Left  Result  Date: 04/01/2018 CLINICAL DATA:  [place] [address] Sept 5th , open wound at lower leg that is not healing after dose of antibiotics, admitted today due to that wound EXAM: LEFT TIBIA AND FIBULA - 2 VIEW COMPARISON:  None. FINDINGS: No fracture. No bone lesion. No bone resorption to suggest osteomyelitis. There is a wound along the lateral aspect of the mid leg. There is surrounding subcutaneous soft tissue edema that is seen diffusely. No soft tissue air. Ankle and knee joints are normally aligned. IMPRESSION: 1. No fracture or evidence of osteomyelitis. 2. No soft tissue air. Electronically Signed   By: Lajean Manes M.D.   On: 04/01/2018 18:40    Medications:  Prior to Admission:  Medications Prior to Admission  Medication Sig Dispense Refill Last Dose  . venlafaxine XR (EFFEXOR-XR) 75 MG 24 hr capsule Take 225 mg by mouth daily with breakfast.     . aspirin 81 MG chewable tablet Chew 162 mg by mouth daily.   Not Taking at Unknown time  . clonazePAM (KLONOPIN) 0.5 MG tablet Take 0.25 mg by mouth 4 (four) times daily as needed for anxiety.    Not Taking at Unknown time  . ibuprofen (ADVIL,MOTRIN) 400 MG tablet Take 1 tablet (400 mg total) by mouth every 6 (six) hours as needed. (Patient not taking: Reported on 04/01/2018) 30 tablet 0 Not Taking at Unknown time  . levETIRAcetam (KEPPRA) 500 MG tablet Take 500 mg by mouth 2 (two) times daily.   Not Taking at Unknown time  . losartan (COZAAR) 50 MG tablet Take 50 mg by mouth daily.   Not Taking at Unknown time  . sulfamethoxazole-trimethoprim (BACTRIM) 400-80 MG tablet Take 1 tablet by mouth 2 (two) times daily. (Patient not taking: Reported on 04/01/2018) 14 tablet 1 Not Taking at Unknown time   Scheduled: . aspirin  162 mg Oral Daily  . heparin  5,000 Units Subcutaneous Q8H  . levETIRAcetam  500 mg Oral BID  . losartan  50 mg Oral Daily  . sodium chloride flush  3 mL Intravenous Q12H  . venlafaxine XR  225 mg Oral Q breakfast   Continuous: . sodium chloride     . vancomycin Stopped (04/01/18 2048)   BUL:AGTXMI chloride, acetaminophen **OR** acetaminophen, clonazePAM, ondansetron **OR** ondansetron (ZOFRAN) IV, sodium chloride flush, traMADol  Assesment: She has cellulitis and abscess  of the left leg.  She is being treated with vancomycin.  She may need other antibiotics depending on how she does.  I think she is going to need outpatient IV antibiotics so I have requested a PICC line.  She has seizure disorder which is stable  \She has both anxiety and depression which are stable  She has chronic back pain which is unchanged  She has a fracture of her clavicle which is being treated Active Problems:   Cellulitis and abscess of left leg   Cellulitis of left leg    Plan: Continue current treatments    LOS: 1 day   Tylen Leverich L 04/02/2018, 8:54 AM

## 2018-04-02 NOTE — Care Management Note (Signed)
Case Management Note  Patient Details  Name: Tammy Terry MRN: [id number] Date of Birth: [birth year]  Subjective/Objective:     Admitted with wound that failed OP abx tx. Pt from home with husband, uses RW, ind with ADL's. Pt has referral sent from ortho office to [facility] last week, they were unable to accept referral d/t staffing. Pt would like to use them for IV abx and HH at DC.                Action/Plan: DC home with IV abx, referral given to [facility], [facility] rep, who is able to accept referral and this time.   Expected Discharge Date:    04/04/18              Expected Discharge Plan:  [place]  In-House Referral:  NA  Discharge planning Services  CM Consult  Post Acute Care Choice:  Home Health Choice offered to:  Patient, Spouse  HH Arranged:   RN, IV abx [place] Agency:  [place]  Status of Service:  In process, will continue to follow   [name], RN 04/02/2018, 11:25 AM

## 2018-04-03 ENCOUNTER — Inpatient Hospital Stay (HOSPITAL_COMMUNITY): Payer: Medicare Other

## 2018-04-03 LAB — BASIC METABOLIC PANEL
Anion gap: 5 (ref 5–15)
BUN: 20 mg/dL (ref 8–23)
CHLORIDE: 105 mmol/L (ref 98–111)
CO2: 26 mmol/L (ref 22–32)
Calcium: 8.8 mg/dL — ABNORMAL LOW (ref 8.9–10.3)
Creatinine, Ser: 0.85 mg/dL (ref 0.44–1.00)
GFR calc Af Amer: >60 mL/min (ref >60)
GFR calc non Af Amer: >60 mL/min (ref >60)
GLUCOSE: 87 mg/dL (ref 70–99)
POTASSIUM: 4.4 mmol/L (ref 3.5–5.1)
Sodium: 136 mmol/L (ref 135–145)

## 2018-04-03 LAB — C-REACTIVE PROTEIN: CRP: <0.8 mg/dL (ref <1.0)

## 2018-04-03 LAB — SEDIMENTATION RATE: Sed Rate: 25 mm/hr — ABNORMAL HIGH (ref 0–22)

## 2018-04-03 MED ORDER — HEPARIN SOD (PORK) LOCK FLUSH 100 UNIT/ML IV SOLN
250.0000 [IU] | Freq: Every day | INTRAVENOUS | Status: DC
  Administered 2018-04-03: 250 [IU]

## 2018-04-03 MED ORDER — SODIUM CHLORIDE 0.9% FLUSH: 10.0000 mL | Freq: Two times a day (BID) | INTRAVENOUS | Status: DC

## 2018-04-03 MED ORDER — SODIUM CHLORIDE 0.9% FLUSH
10.0000 mL | Freq: Two times a day (BID) | INTRAVENOUS | Status: DC
  Administered 2018-04-03: 10 mL

## 2018-04-03 MED ORDER — VANCOMYCIN HCL IN DEXTROSE 1-5 GM/200ML-% IV SOLN: 1000.0000 mg | INTRAVENOUS | 0 refills | Status: AC

## 2018-04-03 MED ORDER — HEPARIN SOD (PORK) LOCK FLUSH 100 UNIT/ML IV SOLN: 250.0000 [IU] | INTRAVENOUS | Status: DC | PRN

## 2018-04-03 MED ORDER — SODIUM CHLORIDE 0.9% FLUSH
10.0000 mL | INTRAVENOUS | Status: DC | PRN
  Administered 2018-04-03: 10 mL
  Filled 2018-04-03: qty 40

## 2018-04-03 MED ORDER — SODIUM CHLORIDE 0.9% FLUSH: 10.0000 mL | INTRAVENOUS | Status: DC | PRN

## 2018-04-03 MED ORDER — HEPARIN SOD (PORK) LOCK FLUSH 100 UNIT/ML IV SOLN
250.0000 [IU] | INTRAVENOUS | Status: DC | PRN
  Filled 2018-04-03: qty 5

## 2018-04-03 NOTE — Progress Notes (Signed)
Pt discharged home today per Dr. Luan Pulling. Pt's PICC line flushed with 10 cc NS and 250 units of heparin. Site WDL. Covered with sock. Pt's VSS. Pt provided with home medication list, discharge instructions and prescriptions. Verbalized understanding. Pt left floor via WC in stable condition accompanied by NT.

## 2018-04-03 NOTE — Progress Notes (Signed)
Subjective: She says she feels okay.  No new complaints.  Dr. Ruthe Mannan note says that he think she could go home if we can make arrangement for IV antibiotics and dressing changes and I think we can make that happen.  She has not had pick or midline put in yet.  Objective: Vital signs in last 24 hours: Temp:  [98 F (36.7 C)-98.4 F (36.9 C)] 98.2 F (36.8 C) (10/09 0550) Pulse Rate:  [68-73] 73 (10/09 0550) Resp:  [20] 20 (10/08 1311) BP: (131-150)/(59-77) 131/59 (10/09 0550) SpO2:  [97 %-100 %] 97 % (10/09 0550) Weight change:  Last BM Date: (PTA)  Intake/Output from previous day: 10/08 0701 - 10/09 0700 In: 360 [P.O.:360] Out: 450 [Urine:450]  PHYSICAL EXAM General appearance: alert, cooperative and mild distress Resp: clear to auscultation bilaterally Cardio: regular rate and rhythm, S1, S2 normal, no murmur, click, rub or gallop GI: soft, non-tender; bowel sounds normal; no masses,  no organomegaly Extremities: The wound looks a little bit better She is quite anxious which is chronic  Lab Results:  Results for orders placed or performed during the hospital encounter of 04/01/18 (from the past 48 hour(s))  Basic metabolic panel     Status: Abnormal   Collection Time: 04/01/18  4:59 PM  Result Value Ref Range   Sodium 136 135 - 145 mmol/L   Potassium 4.0 3.5 - 5.1 mmol/L   Chloride 104 98 - 111 mmol/L   CO2 23 22 - 32 mmol/L   Glucose, Bld 89 70 - 99 mg/dL   BUN 21 8 - 23 mg/dL   Creatinine, Ser 1.07 (H) 0.44 - 1.00 mg/dL   Calcium 9.4 8.9 - 10.3 mg/dL   GFR calc non Af Amer 48 (L) >60 mL/min   GFR calc Af Amer 56 (L) >60 mL/min    Comment: (NOTE) The eGFR has been calculated using the CKD EPI equation. This calculation has not been validated in all clinical situations. eGFR's persistently <60 mL/min signify possible Chronic Kidney Disease.    Anion gap 9 5 - 15    Comment: Performed at [facility], [address], [place], [place] [address]  CBC WITH  DIFFERENTIAL     Status: Abnormal   Collection Time: 04/01/18  4:59 PM  Result Value Ref Range   WBC 7.0 4.0 - 10.5 K/uL   RBC 3.83 (L) 3.87 - 5.11 MIL/uL   Hemoglobin 11.8 (L) 12.0 - 15.0 g/dL   HCT 37.2 36.0 - 46.0 %   MCV 97.1 78.0 - 100.0 fL   MCH 30.8 26.0 - 34.0 pg   MCHC 31.7 30.0 - 36.0 g/dL   RDW 14.2 11.5 - 15.5 %   Platelets 145 (L) 150 - 400 K/uL   Neutrophils Relative % 76 %   Neutro Abs 5.3 1.7 - 7.7 K/uL   Lymphocytes Relative 17 %   Lymphs Abs 1.2 0.7 - 4.0 K/uL   Monocytes Relative 7 %   Monocytes Absolute 0.5 0.1 - 1.0 K/uL   Eosinophils Relative 0 %   Eosinophils Absolute 0.0 0.0 - 0.7 K/uL   Basophils Relative 0 %   Basophils Absolute 0.0 0.0 - 0.1 K/uL    Comment: Performed at [facility], [address], [place], [place] [address]  Basic metabolic panel     Status: Abnormal   Collection Time: 04/02/18  4:45 AM  Result Value Ref Range   Sodium 141 135 - 145 mmol/L   Potassium 4.5 3.5 - 5.1 mmol/L  Chloride 110 98 - 111 mmol/L   CO2 25 22 - 32 mmol/L   Glucose, Bld 91 70 - 99 mg/dL   BUN 18 8 - 23 mg/dL   Creatinine, Ser 1.07 (H) 0.44 - 1.00 mg/dL   Calcium 9.2 8.9 - 10.3 mg/dL   GFR calc non Af Amer 48 (L) >60 mL/min   GFR calc Af Amer 56 (L) >60 mL/min    Comment: (NOTE) The eGFR has been calculated using the CKD EPI equation. This calculation has not been validated in all clinical situations. eGFR's persistently <60 mL/min signify possible Chronic Kidney Disease.    Anion gap 6 5 - 15    Comment: Performed at [facility], [address] [place], [place] [address]  Basic metabolic panel     Status: Abnormal   Collection Time: 04/03/18  5:54 AM  Result Value Ref Range   Sodium 136 135 - 145 mmol/L   Potassium 4.4 3.5 - 5.1 mmol/L   Chloride 105 98 - 111 mmol/L   CO2 26 22 - 32 mmol/L   Glucose, Bld 87 70 - 99 mg/dL   BUN 20 8 - 23 mg/dL   Creatinine, Ser 0.85 0.44 - 1.00 mg/dL   Calcium 8.8 (L) 8.9 - 10.3 mg/dL   GFR calc non Af Amer >60  >60 mL/min   GFR calc Af Amer >60 >60 mL/min    Comment: (NOTE) The eGFR has been calculated using the CKD EPI equation. This calculation has not been validated in all clinical situations. eGFR's persistently <60 mL/min signify possible Chronic Kidney Disease.    Anion gap 5 5 - 15    Comment: Performed at [facility], [address], [place], [place] [address]  Sedimentation rate     Status: Abnormal   Collection Time: 04/03/18  5:54 AM  Result Value Ref Range   Sed Rate 25 (H) 0 - 22 mm/hr    Comment: Performed at [facility], [address], [place], [place] [address]    ABGS No results for input(s): PHART, PO2ART, TCO2, HCO3 in the last 72 hours.  Invalid input(s): PCO2 CULTURES No results found for this or any previous visit (from the past 240 hour(s)). Studies/Results: Dg Tibia/fibula Left  Result Date: 04/01/2018 CLINICAL DATA:  [place] [address] Sept 5th , open wound at lower leg that is not healing after dose of antibiotics, admitted today due to that wound EXAM: LEFT TIBIA AND FIBULA - 2 VIEW COMPARISON:  None. FINDINGS: No fracture. No bone lesion. No bone resorption to suggest osteomyelitis. There is a wound along the lateral aspect of the mid leg. There is surrounding subcutaneous soft tissue edema that is seen diffusely. No soft tissue air. Ankle and knee joints are normally aligned. IMPRESSION: 1. No fracture or evidence of osteomyelitis. 2. No soft tissue air. Electronically Signed   By: Lajean Manes M.D.   On: 04/01/2018 18:40    Medications:  Prior to Admission:  Medications Prior to Admission  Medication Sig Dispense Refill Last Dose  . venlafaxine XR (EFFEXOR-XR) 75 MG 24 hr capsule Take 225 mg by mouth daily with breakfast.     . aspirin 81 MG chewable tablet Chew 162 mg by mouth daily.   Not Taking at Unknown time  . clonazePAM (KLONOPIN) 0.5 MG tablet Take 0.25 mg by mouth 4 (four) times daily as needed for anxiety.    Not Taking at Unknown time  . ibuprofen  (ADVIL,MOTRIN) 400 MG tablet Take 1 tablet (400 mg total)  by mouth every 6 (six) hours as needed. (Patient not taking: Reported on 04/01/2018) 30 tablet 0 Not Taking at Unknown time  . levETIRAcetam (KEPPRA) 500 MG tablet Take 500 mg by mouth 2 (two) times daily.   Not Taking at Unknown time  . losartan (COZAAR) 50 MG tablet Take 50 mg by mouth daily.   Not Taking at Unknown time  . sulfamethoxazole-trimethoprim (BACTRIM) 400-80 MG tablet Take 1 tablet by mouth 2 (two) times daily. (Patient not taking: Reported on 04/01/2018) 14 tablet 1 Not Taking at Unknown time   Scheduled: . aspirin  162 mg Oral Daily  . heparin  5,000 Units Subcutaneous Q8H  . levETIRAcetam  500 mg Oral BID  . losartan  50 mg Oral Daily  . sodium chloride flush  3 mL Intravenous Q12H  . venlafaxine XR  225 mg Oral Q breakfast   Continuous: . sodium chloride    . vancomycin Stopped (04/02/18 2129)   WUX:LKGMWN chloride, acetaminophen **OR** acetaminophen, clonazePAM, ondansetron **OR** ondansetron (ZOFRAN) IV, sodium chloride flush, traMADol  Assesment: She was admitted with cellulitis of her left leg.  This occurred when she fell.  Its better with IV antibiotics.  She failed outpatient oral antibiotics. Active Problems:   Cellulitis and abscess of left leg   Cellulitis of left leg    Plan: Per Dr. Aline Brochure she can be discharged probably today.  She does need a PICC line and she will need 7 days IV antibiotics at home.  She will also need dressing changes twice a week.    LOS: 2 days   Paytin Ramakrishnan L 04/03/2018, 8:49 AM

## 2018-04-03 NOTE — Progress Notes (Signed)
Subjective: Had pain yesterday in the evening otherwise pain is been mild   Objective: Vital signs in last 24 hours: Temp:  [98 F (36.7 C)-98.4 F (36.9 C)] 98.2 F (36.8 C) (10/09 0550) Pulse Rate:  [68-73] 73 (10/09 0550) Resp:  [20] 20 (10/08 1311) BP: (131-150)/(59-77) 131/59 (10/09 0550) SpO2:  [97 %-100 %] 97 % (10/09 0550)  Intake/Output from previous day: 10/08 0701 - 10/09 0700 In: 360 [P.O.:360] Out: 450 [Urine:450] Intake/Output this shift: No intake/output data recorded.  Recent Labs    04/01/18 1659  HGB 11.8*   Recent Labs    04/01/18 1659  WBC 7.0  RBC 3.83*  HCT 37.2  PLT 145*   Recent Labs    04/02/18 0445 04/03/18 0554  NA 141 136  K 4.5 4.4  CL 110 105  CO2 25 26  BUN 18 20  CREATININE 1.07* 0.85  GLUCOSE 91 87  CALCIUM 9.2 8.8*    Assessment/Plan: Erythrocyte Sedimentation Rate     Component Value Date/Time   ESRSEDRATE 25 (H) 04/03/2018 0554   Wound care nurse,  dressing procedure/placement/frequency:Cleanse wounds to left lateral leg with NS.  Apply NS moist Aquacel Ag to wound bed.  Cover with gauze and kerlix. Change Tuesday and Saturday.  Bedside RN to perform.   Vancomycin day 3  If home health can be arranged for the dressing changes and the IV therapy can be continued at home for 7 days then the patient will be eligible for discharge   Arther Abbott 04/03/2018, 8:03 AM

## 2018-04-03 NOTE — Care Management Note (Signed)
Case Management Note  Patient Details  Name: Tammy Terry MRN: [id number] Date of Birth: [birth year]  Expected Discharge Date:  04/03/18               Expected Discharge Plan:  [place]  In-House Referral:  NA  Discharge planning Services  CM Consult  Post Acute Care Choice:  Home Health Choice offered to:  Patient, Spouse  [facility] Arranged:   04/03/18 [place] Agency:  [place]  Status of Service:  Completed, signed off  If discussed at [place] of Stay Meetings, dates discussed:    Additional Comments: DC home today with [facility] services through [facility] for IV abx and wound care. [name], [facility] rep, aware of DC today. Pt will need first dose IV abx at home tomorrow. Pt aware her husbnad will need to be present. Patient concerned she will not be able to remember everything. CM ensured her we would involve her husband as much as possible and the [facility] RN could provide written instructions. CM has made call to husband to solidify DC plan also.   [name], RN 04/03/2018, 10:54 AM

## 2018-04-03 NOTE — Progress Notes (Signed)
Peripherally Inserted Central Catheter/Midline Placement  The IV Nurse has discussed with the patient and/or persons authorized to consent for the patient, the purpose of this procedure and the potential benefits and risks involved with this procedure.  The benefits include less needle sticks, lab draws from the catheter, and the patient may be discharged home with the catheter. Risks include, but not limited to, infection, bleeding, blood clot (thrombus formation), and puncture of an artery; nerve damage and irregular heartbeat and possibility to perform a PICC exchange if needed/ordered by physician.  Alternatives to this procedure were also discussed.  Bard Power PICC patient education guide, fact sheet on infection prevention and patient information card has been provided to patient /or left at bedside.    PICC/Midline Placement Documentation        Darlyn Read 04/03/2018, 11:59 AM

## 2018-04-03 NOTE — Care Management Important Message (Signed)
Important Message  Patient Details  Name: Tammy Terry MRN: [id number] Date of Birth: 31-Oct-1940   Medicare Important Message Given:  Yes    Shelda Altes 04/03/2018, 11:44 AM

## 2018-04-03 NOTE — Discharge Summary (Signed)
Physician Discharge Summary  Patient ID: JADIA CAPERS MRN: [id number] DOB/AGE: 02-17-1941 77 y.o. Primary Care Physician:Dory Verdun, Percell Miller, MD Admit date: 04/01/2018 Discharge date: 04/03/2018    Discharge Diagnoses:   Active Problems:   Depression with anxiety   Hyperlipidemia   Essential hypertension, benign   Cellulitis and abscess of left leg   Cellulitis of left leg   Allergies as of 04/03/2018      Reactions   Amoxicillin Other (See Comments)   Unknown reaction   Codeine    Unknown reaction      Medication List    STOP taking these medications   ibuprofen 400 MG tablet Commonly known as:  ADVIL,MOTRIN   sulfamethoxazole-trimethoprim 400-80 MG tablet Commonly known as:  BACTRIM,SEPTRA     TAKE these medications   aspirin 81 MG chewable tablet Chew 162 mg by mouth daily.   clonazePAM 0.5 MG tablet Commonly known as:  KLONOPIN Take 0.25 mg by mouth 4 (four) times daily as needed for anxiety.   levETIRAcetam 500 MG tablet Commonly known as:  KEPPRA Take 500 mg by mouth 2 (two) times daily.   losartan 50 MG tablet Commonly known as:  COZAAR Take 50 mg by mouth daily.   vancomycin 1-5 GM/200ML-% Soln Commonly known as:  VANCOCIN Inject 200 mLs (1,000 mg total) into the vein daily for 7 days.   venlafaxine XR 75 MG 24 hr capsule Commonly known as:  EFFEXOR-XR Take 225 mg by mouth daily with breakfast.       Discharged Condition: Improved    Consults: Dr. Aline Brochure  Significant Diagnostic Studies: Dg Clavicle Left  Result Date: 03/25/2018 Diagnostic clavicle 2 views AP and lateral History of possible clavicle fracture based on hematoma seen on CT scan I do not see any clavicle fracture clinical correlation will be needed to assess further and see if any imaging studies need to be added to   Dg Tibia/fibula Left  Result Date: 04/01/2018 CLINICAL DATA:  [place] [address] Sept 5th , open wound at lower leg that is not healing after dose of antibiotics, admitted  today due to that wound EXAM: LEFT TIBIA AND FIBULA - 2 VIEW COMPARISON:  None. FINDINGS: No fracture. No bone lesion. No bone resorption to suggest osteomyelitis. There is a wound along the lateral aspect of the mid leg. There is surrounding subcutaneous soft tissue edema that is seen diffusely. No soft tissue air. Ankle and knee joints are normally aligned. IMPRESSION: 1. No fracture or evidence of osteomyelitis. 2. No soft tissue air. Electronically Signed   By: Lajean Manes M.D.   On: 04/01/2018 18:40    Lab Results: Basic Metabolic Panel: Recent Labs    04/02/18 0445 04/03/18 0554  NA 141 136  K 4.5 4.4  CL 110 105  CO2 25 26  GLUCOSE 91 87  BUN 18 20  CREATININE 1.07* 0.85  CALCIUM 9.2 8.8*   Liver Function Tests: No results for input(s): AST, ALT, ALKPHOS, BILITOT, PROT, ALBUMIN in the last 72 hours.   CBC: Recent Labs    04/01/18 1659  WBC 7.0  NEUTROABS 5.3  HGB 11.8*  HCT 37.2  MCV 97.1  PLT 145*    No results found for this or any previous visit (from the past 240 hour(s)).   Hospital Course: This is a 77 year old who was at her orthopedist office and was found to have significant issues with a leg wound that had not responded to outpatient treatment.  She was admitted to the hospital by  Dr. Aline Brochure was expected to have to leave so he admitted her to my service since I am her primary care.  She was started on vancomycin intravenously and improved.  She felt better and had wound care consultation and their treatments are being undertaken.  She had a PICC line/midline catheter placed and did well.  She has plans to go home with for 7 more days of IV antibiotics.  She will have dressing changes twice a week.  Discharge Exam: Blood pressure (!) 131/59, pulse 73, temperature 98.2 F (36.8 C), temperature source Oral, resp. rate 20, height 5\' 6"  (1.676 m), weight 70.3 kg, SpO2 97 %. She is awake and alert.  Her wound looks okay.  Disposition: Home with home health  services      Signed: Katora Fini L   04/03/2018, 8:56 AM

## 2018-08-19 ENCOUNTER — Ambulatory Visit (INDEPENDENT_AMBULATORY_CARE_PROVIDER_SITE_OTHER): Payer: Medicare Other | Admitting: Otolaryngology

## 2018-08-19 DIAGNOSIS — R490 Dysphonia: Secondary | ICD-10-CM

## 2018-09-13 ENCOUNTER — Other Ambulatory Visit: Payer: Self-pay

## 2018-09-13 NOTE — Patient Outreach (Signed)
[place] [facility]) Care Management  09/13/2018  JARITZA DUIGNAN 03-23-1941 [id number]   Medication Adherence call to Mrs. Keia Rask spoke with patient she is due on Rosuvastatin 10 mg patient explain pharmacy pill packs and delivers on a monthly basis but, sometimes she forgets to take her medications.Mrs. Corporan is showing past due under [facility] [place].    [place] Management Direct Dial [phone]  Fax [phone] [email or web address]

## 2018-11-20 ENCOUNTER — Other Ambulatory Visit: Payer: Self-pay

## 2018-11-20 NOTE — Patient Outreach (Signed)
[facility]) Care Management  11/20/2018  NALEE LIGHTLE 05/27/1941 [id number]   Medication Adherence call to Mrs. Tiarna Koppen [facility] Identifiers Verify spoke with patient she is due on Losartan 50 mg and Rosuvastatin 10 mg patient explain she is no longer taking both medications. Mrs. Agosto is showing past due under [place].   [facility] Direct Dial [phone]  Fax [phone] [email or web address]

## 2018-12-12 LAB — BASIC METABOLIC PANEL
BUN: 20 (ref 4–21)
CO2: 25 — AB (ref 13–22)
Chloride: 103 (ref 99–108)
Creatinine: 0.8
Glucose: 90
Potassium: 4.7 (ref 3.4–5.3)
Sodium: 144 (ref 137–147)

## 2018-12-12 LAB — CBC AND DIFFERENTIAL
HCT: 40 (ref 39.0–52.0)
Hemoglobin: 13.2 (ref 13.0–17.0)
Neutrophils Absolute: 3
Platelets: 162 (ref 150–399)
WBC: 4.6

## 2018-12-12 LAB — HEPATIC FUNCTION PANEL
ALT: 25
AST: 27
Alkaline Phosphatase: 83 (ref 25–125)

## 2018-12-12 LAB — COMPREHENSIVE METABOLIC PANEL
Albumin: 4.9 (ref 3.5–5.0)
Calcium: 10.3 (ref 8.7–10.7)
GFR calc Af Amer: 84
GFR calc non Af Amer: 73

## 2018-12-12 LAB — VITAMIN B12: Vitamin B-12: 246

## 2018-12-12 LAB — TSH: TSH: 2.57 (ref <=5.90)

## 2018-12-12 LAB — CBC: RBC: 4.23 (ref 3.87–5.11)

## 2018-12-13 LAB — LIPID PANEL
Cholesterol: 211 — AB (ref 0–200)
HDL: 72 — AB (ref 35–70)
LDL Cholesterol: 116
Triglycerides: 117 (ref 40–160)

## 2019-05-25 DIAGNOSIS — R55 Syncope and collapse: Secondary | ICD-10-CM

## 2019-05-25 DIAGNOSIS — R2689 Other abnormalities of gait and mobility: Secondary | ICD-10-CM | POA: Insufficient documentation

## 2019-05-25 DIAGNOSIS — C50919 Malignant neoplasm of unspecified site of unspecified female breast: Secondary | ICD-10-CM

## 2019-05-25 DIAGNOSIS — R569 Unspecified convulsions: Secondary | ICD-10-CM | POA: Insufficient documentation

## 2019-05-25 DIAGNOSIS — F419 Anxiety disorder, unspecified: Secondary | ICD-10-CM | POA: Insufficient documentation

## 2019-05-25 DIAGNOSIS — Z9010 Acquired absence of unspecified breast and nipple: Secondary | ICD-10-CM | POA: Insufficient documentation

## 2019-05-25 DIAGNOSIS — R251 Tremor, unspecified: Secondary | ICD-10-CM

## 2019-05-25 DIAGNOSIS — F329 Major depressive disorder, single episode, unspecified: Secondary | ICD-10-CM | POA: Insufficient documentation

## 2019-05-25 DIAGNOSIS — K760 Fatty (change of) liver, not elsewhere classified: Secondary | ICD-10-CM | POA: Insufficient documentation

## 2019-05-25 DIAGNOSIS — M159 Polyosteoarthritis, unspecified: Secondary | ICD-10-CM | POA: Insufficient documentation

## 2019-05-25 DIAGNOSIS — I10 Essential (primary) hypertension: Secondary | ICD-10-CM | POA: Insufficient documentation

## 2019-05-25 DIAGNOSIS — M810 Age-related osteoporosis without current pathological fracture: Secondary | ICD-10-CM

## 2019-05-25 DIAGNOSIS — R600 Localized edema: Secondary | ICD-10-CM

## 2019-05-25 DIAGNOSIS — G4700 Insomnia, unspecified: Secondary | ICD-10-CM

## 2019-05-25 DIAGNOSIS — W19XXXA Unspecified fall, initial encounter: Secondary | ICD-10-CM | POA: Insufficient documentation

## 2019-05-25 DIAGNOSIS — M5410 Radiculopathy, site unspecified: Secondary | ICD-10-CM

## 2019-11-13 DIAGNOSIS — D519 Vitamin B12 deficiency anemia, unspecified: Secondary | ICD-10-CM | POA: Diagnosis not present

## 2020-01-30 DIAGNOSIS — D519 Vitamin B12 deficiency anemia, unspecified: Secondary | ICD-10-CM | POA: Diagnosis not present

## 2020-03-04 DIAGNOSIS — N3944 Nocturnal enuresis: Secondary | ICD-10-CM | POA: Diagnosis not present

## 2020-03-09 DIAGNOSIS — D519 Vitamin B12 deficiency anemia, unspecified: Secondary | ICD-10-CM | POA: Diagnosis not present

## 2020-07-12 IMAGING — CT CT CERVICAL SPINE W/O CM
4 of 7 series · 14 of 33 positions shown, 15 images · non-contrast
Comparison: MRI brain 06/13/2016

CLINICAL DATA: Fell down stairs. Neck pain. Headache.

EXAM:
CT HEAD WITHOUT CONTRAST
CT CERVICAL SPINE WITHOUT CONTRAST
TECHNIQUE: Multidetector CT imaging of the head and cervical spine was
performed following the standard protocol without intravenous
contrast. Multiplanar CT image reconstructions of the cervical spine
were also generated.

[Series 8: c spine soft · axial · 0.31mm/px · z∈[+51,+145]mm · 4 of 79 slices shown]
[im 16/79  soft-tissue]
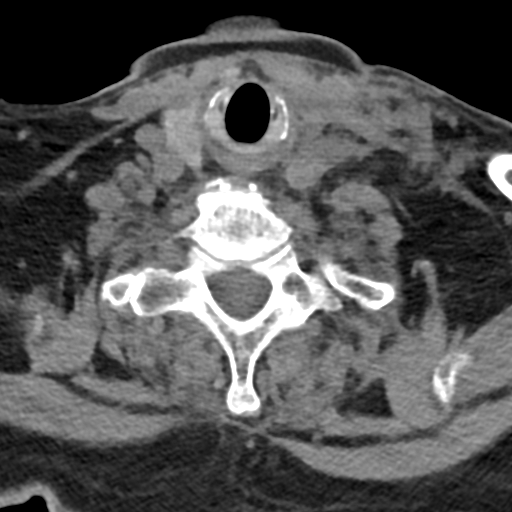
[im 32/79  soft-tissue]
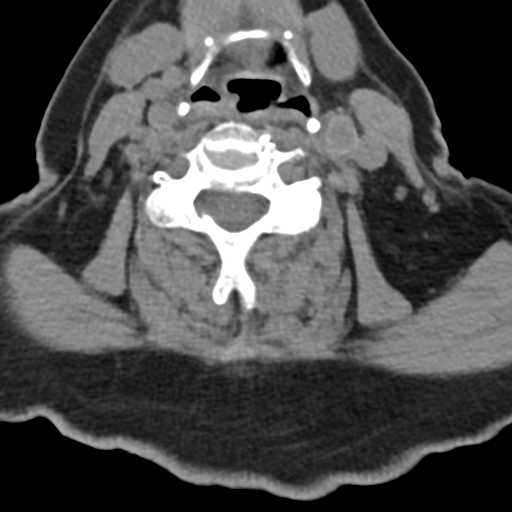
[im 47/79  soft-tissue]
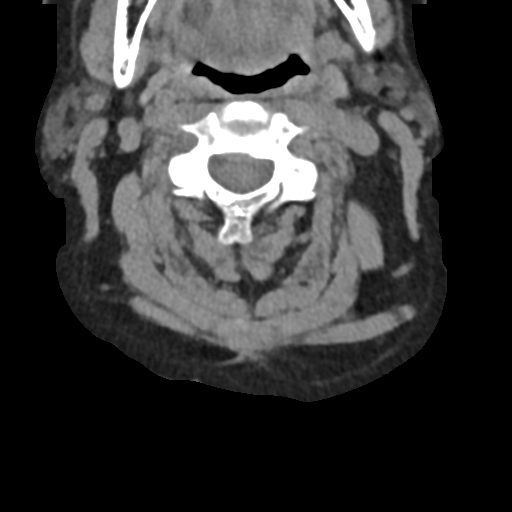
[im 63/79  soft-tissue]
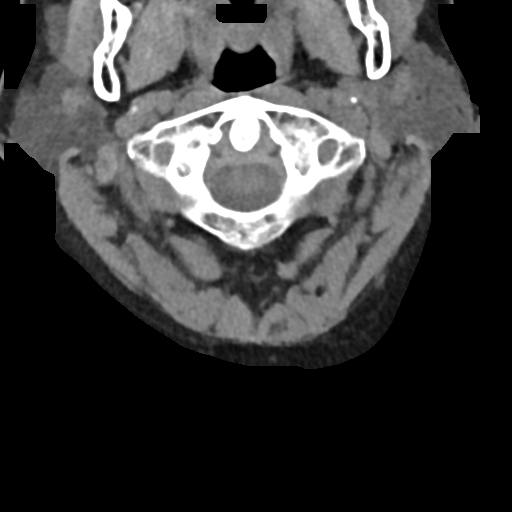

[Series 11: sagittal bone · sagittal · 0.23mm/px · 5 of 61 slices shown]
[im 11/61  bone]
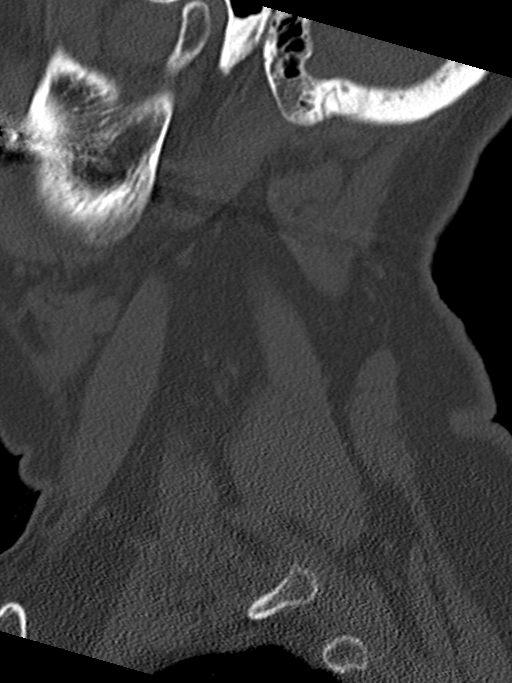
[im 21/61  bone]
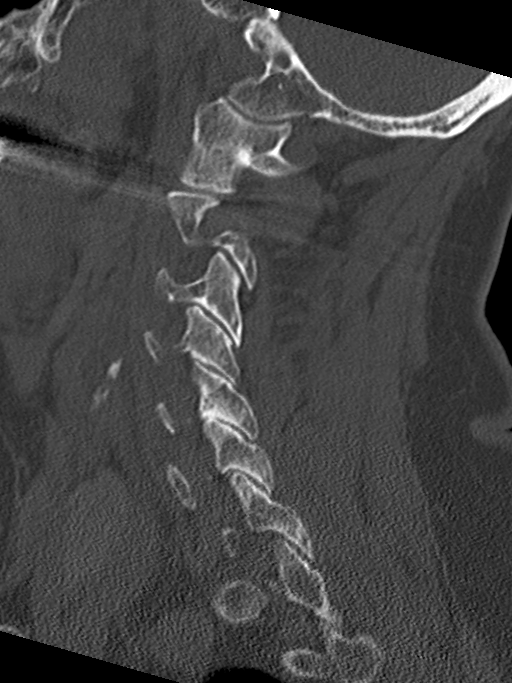
[im 31/61  bone]
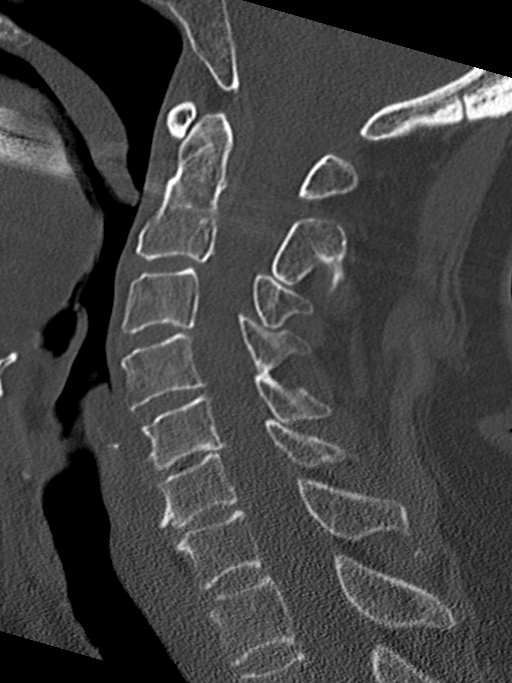
[im 41/61  bone]
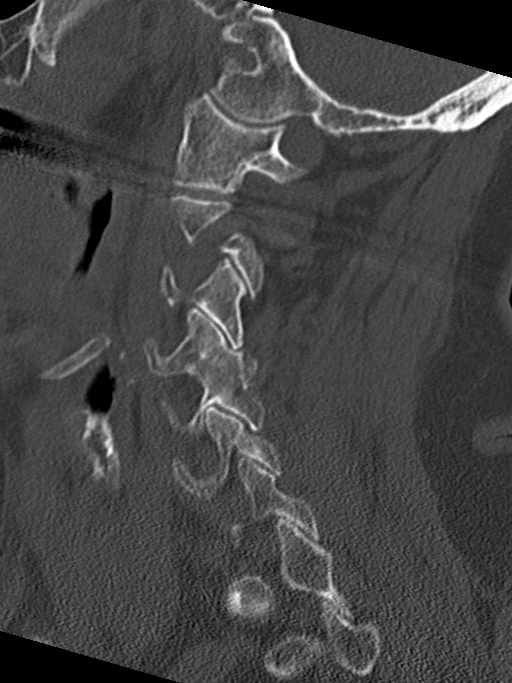
[im 51/61  bone]
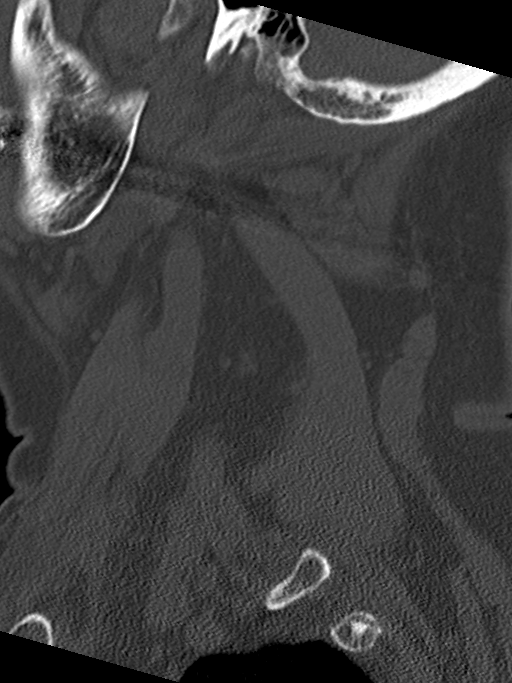

[Series 12: coronal bone · coronal · 0.23mm/px · 1 of 61 slices shown]
[im 31/61  bone]
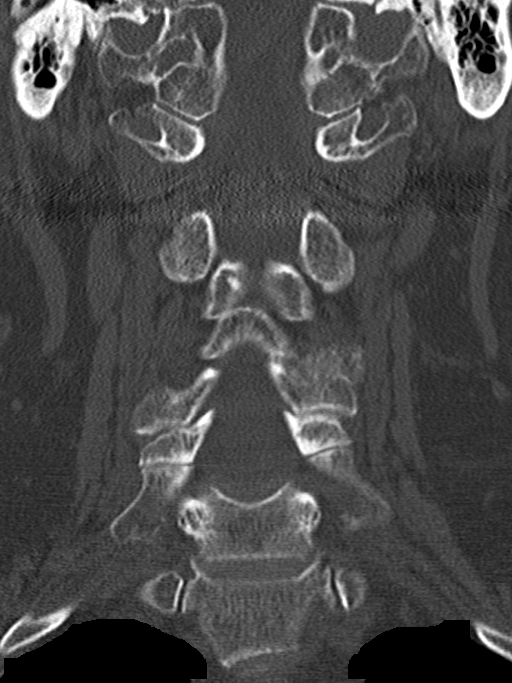

[Series 13: orthogonal bone · axial · 0.21mm/px · z∈[+28,+125]mm · 4 of 84 slices shown, 5 images]
[im 17/84  soft-tissue]
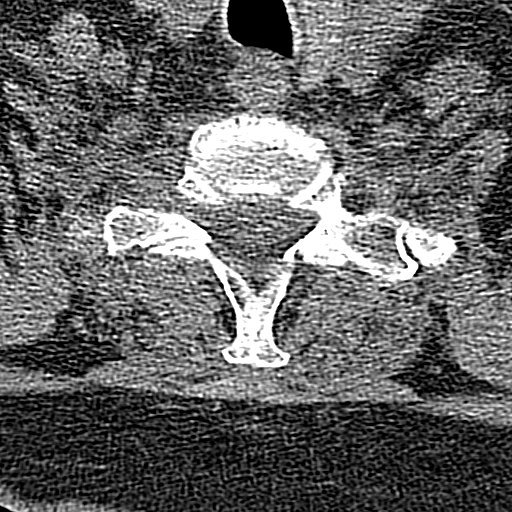
[im 17/84  bone]
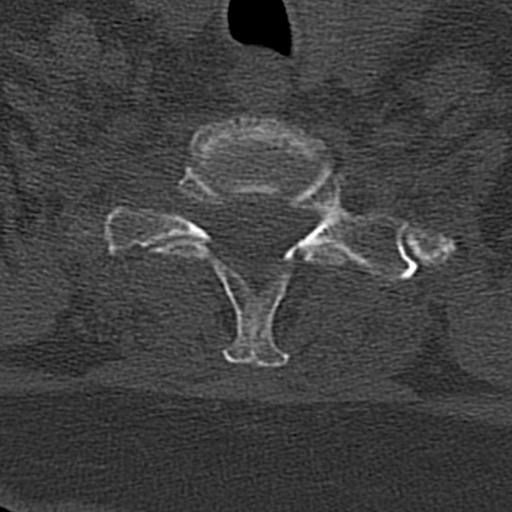
[im 34/84  bone]
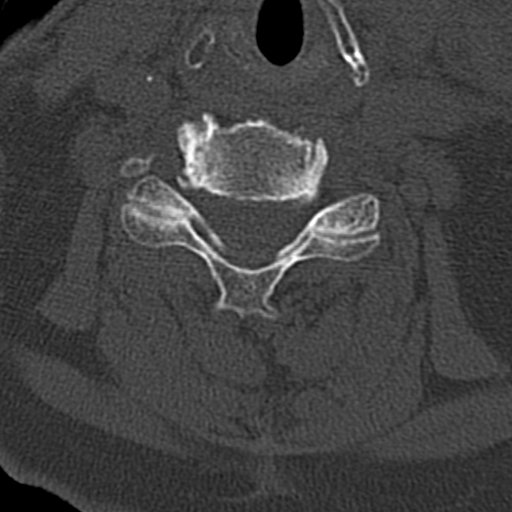
[im 50/84  bone]
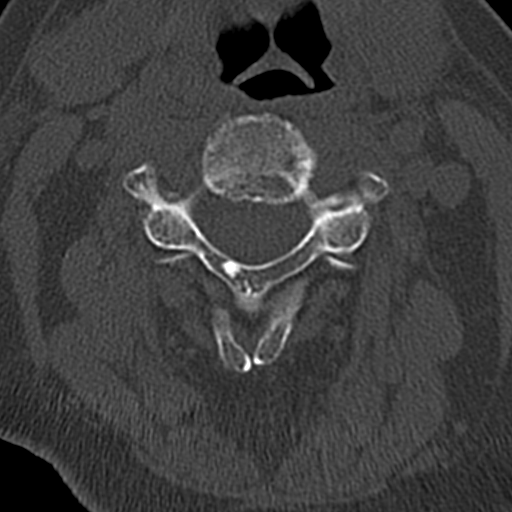
[im 67/84  bone]
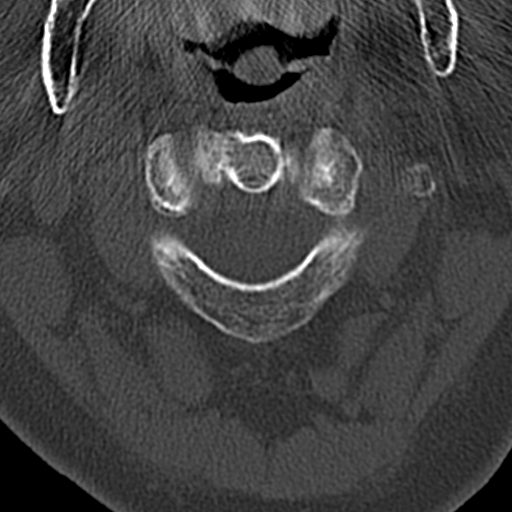

[14 of 33 positions shown; findings below may reference images not displayed]

FINDINGS: CT HEAD FINDINGS

Brain: Diffuse cerebral atrophy. Ventricular dilatation likely due
to central atrophy. Low-attenuation changes throughout the deep
white matter likely due to small vessel ischemic changes. No
evidence of acute infarction, hemorrhage, hydrocephalus, extra-axial
collection or mass lesion/mass effect.

Vascular: Moderate intracranial arterial calcifications are present.

Skull: Calvarium appears intact.

Sinuses/Orbits: Paranasal sinuses and mastoid air cells are clear.

Other: None.

CT CERVICAL SPINE FINDINGS

Alignment: Normal alignment of the cervical vertebrae and facet
joints. C1-2 articulation appears intact.

Skull base and vertebrae: Skull base appears intact. No vertebral
compression deformities. No focal bone lesion or bone destruction.
Bone cortex appears intact.

Soft tissues and spinal canal: No prevertebral soft tissue swelling.
No abnormal paraspinal soft tissue mass or infiltration.

Disc levels: Degenerative changes throughout the cervical spine with
narrowed interspaces and endplate hypertrophic changes throughout.
Degenerative changes in the facet joints.

Upper chest: Lung apices are clear. Azygos lobe. There is a hematoma
in the left medial periclavicular region with cortical irregularity
demonstrated in the visualized portion of the left clavicle. There
is likely a left clavicular fracture, incompletely included within
this study.

Other: Vascular calcifications.
IMPRESSION: 1. Head CT: No acute intracranial abnormalities. Chronic atrophy and
small vessel ischemic changes.
2. CERVICAL SPINE: Normal alignment of the cervical spine. Diffuse
degenerative changes. No acute displaced cervical spine fractures
identified.
3. Probable left clavicular fracture with surrounding hematoma,
incompletely included within this study.

## 2020-07-12 IMAGING — DX DG KNEE COMPLETE 4+V*L*
4 series · 4 of 4 positions shown · non-contrast
Comparison: 11/12/2013

CLINICAL DATA: Patient states she was climbing some stairs outside
and fell. She is now having left knee pain. Hx of osteoporosis.

EXAM:
LEFT KNEE - COMPLETE 4+ VIEW

[knee ap (1 of 3)]
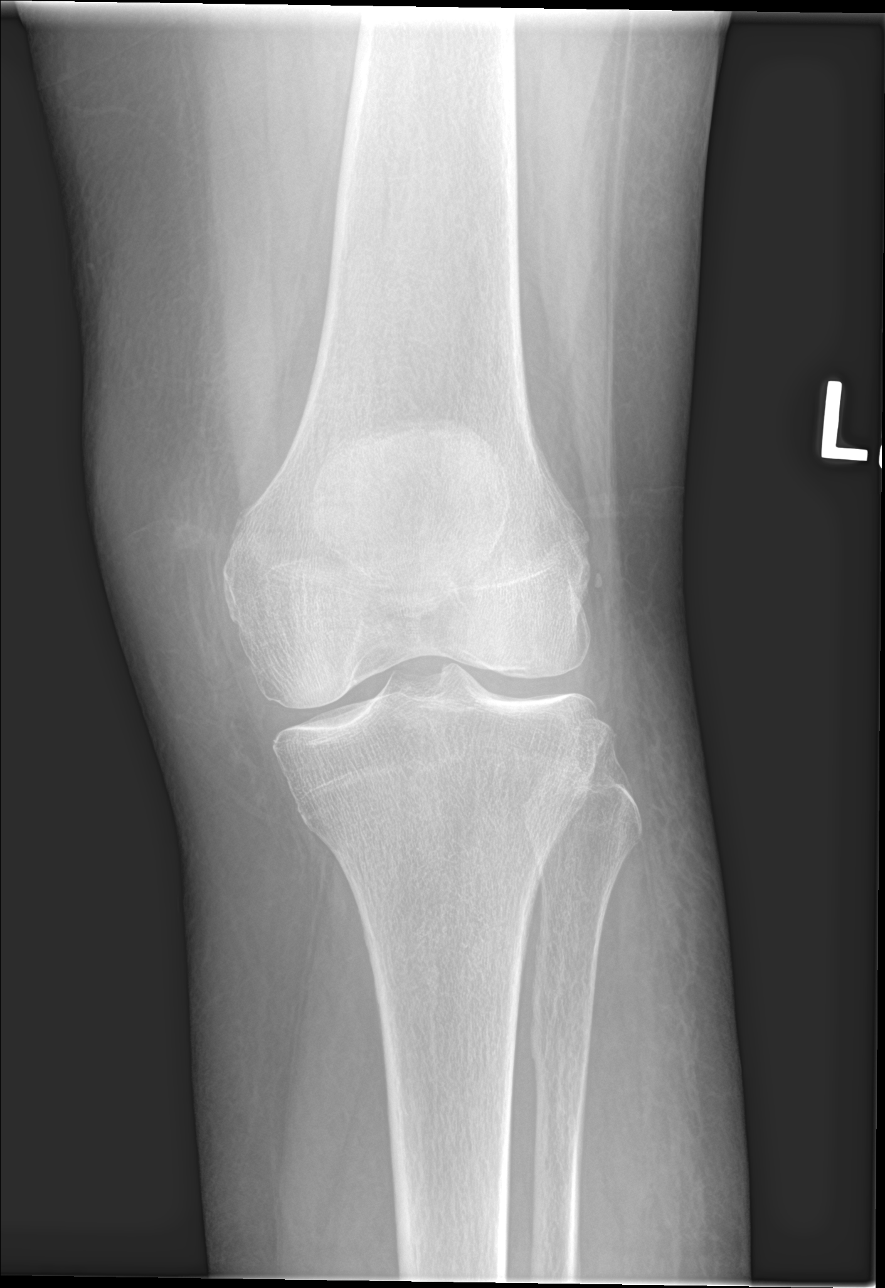

[knee ap (2 of 3)]
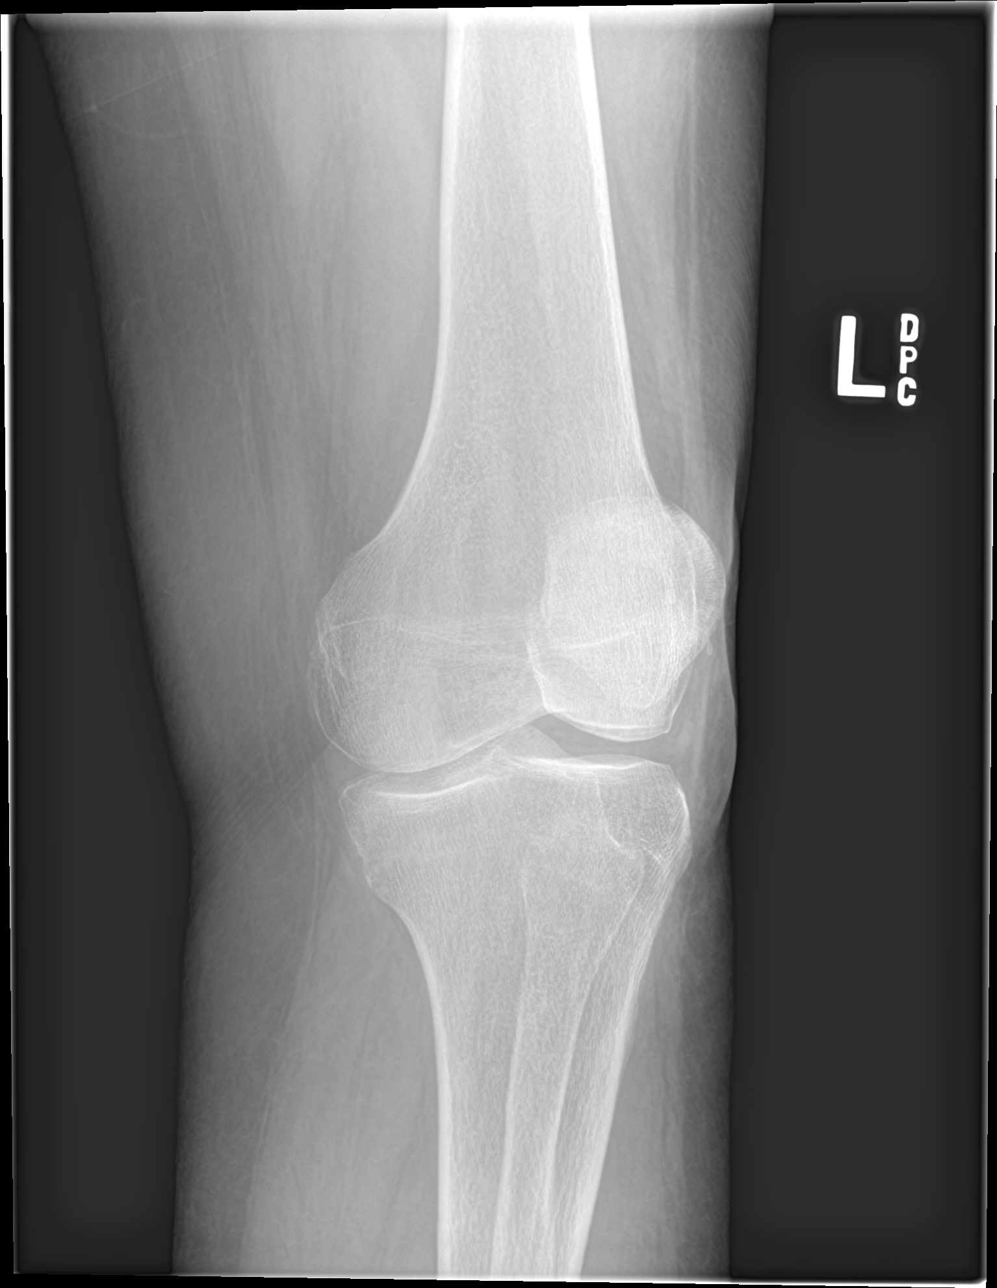

[knee ap (3 of 3)]
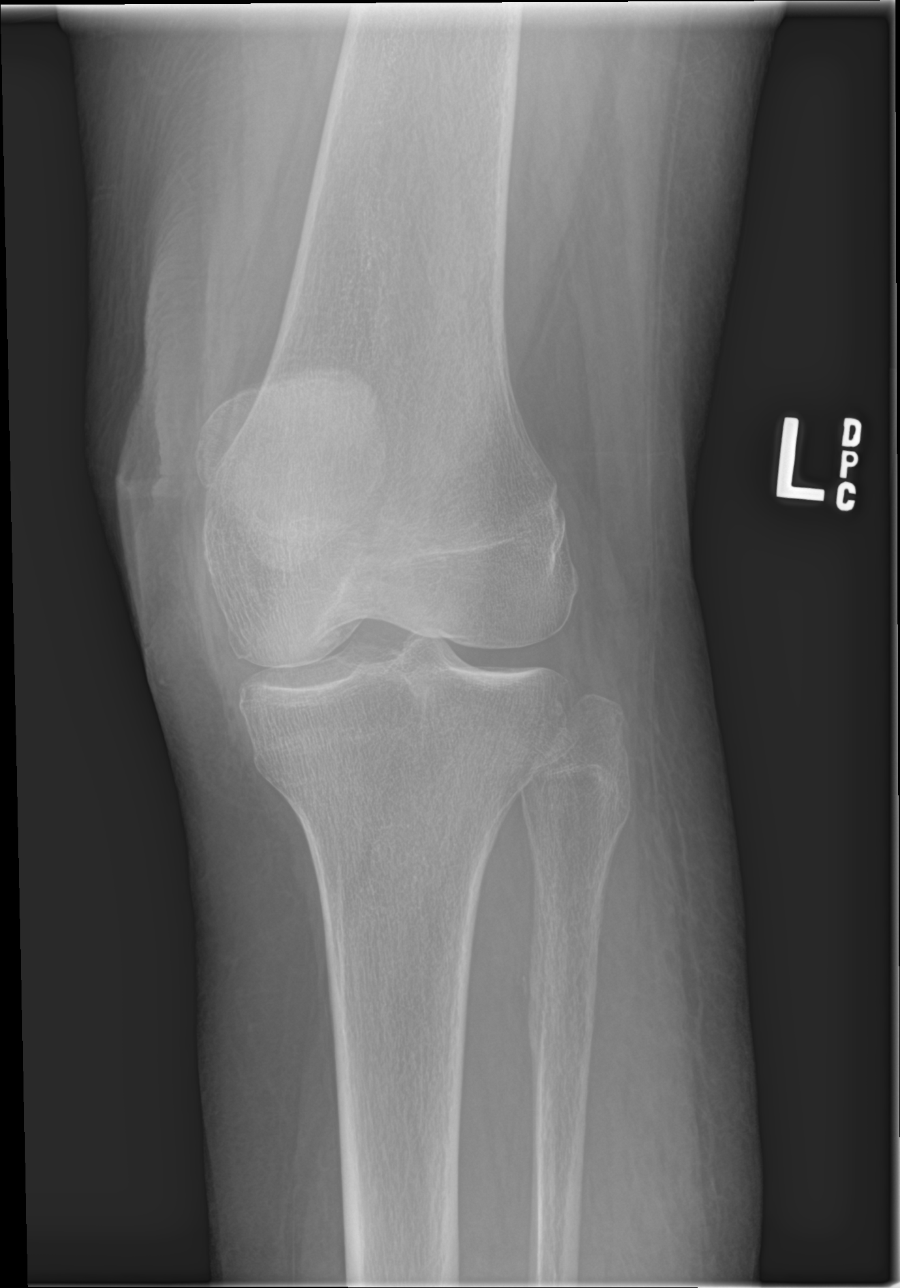

[knee lat]
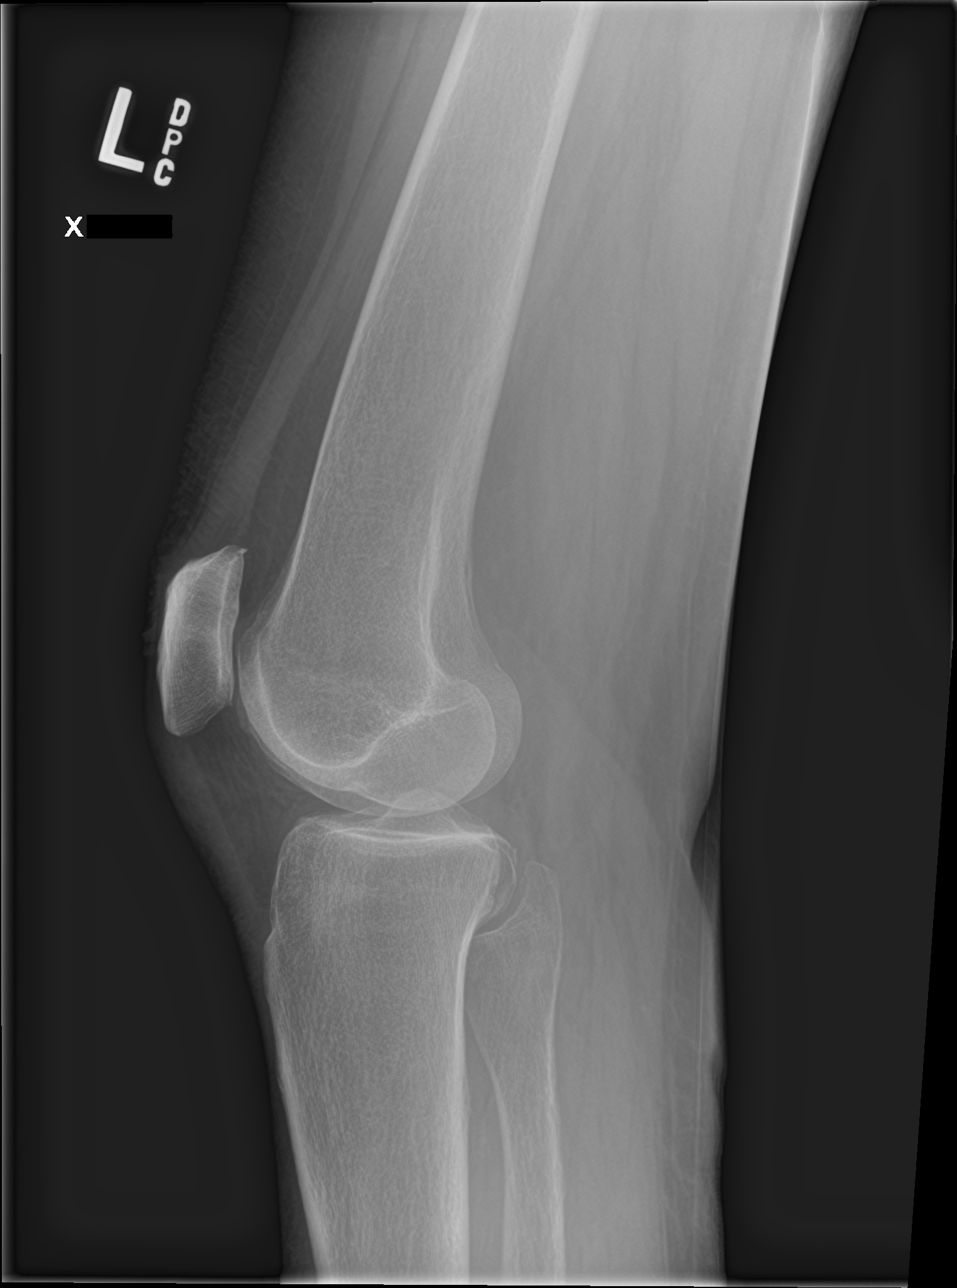

[4 of 4 positions shown; findings below may reference images not displayed]

FINDINGS: No evidence of fracture, dislocation, or joint effusion. No evidence
of arthropathy or other focal bone abnormality. Soft tissues are
unremarkable.
IMPRESSION: Negative.

## 2020-12-15 DIAGNOSIS — M7989 Other specified soft tissue disorders: Secondary | ICD-10-CM | POA: Diagnosis not present

## 2020-12-16 DIAGNOSIS — M7989 Other specified soft tissue disorders: Secondary | ICD-10-CM | POA: Diagnosis not present

## 2020-12-20 DIAGNOSIS — M7989 Other specified soft tissue disorders: Secondary | ICD-10-CM | POA: Diagnosis not present

## 2020-12-20 DIAGNOSIS — R234 Changes in skin texture: Secondary | ICD-10-CM | POA: Diagnosis not present

## 2021-01-31 ENCOUNTER — Other Ambulatory Visit: Payer: Self-pay

## 2021-01-31 ENCOUNTER — Emergency Department (HOSPITAL_COMMUNITY): Payer: Medicare PPO

## 2021-01-31 ENCOUNTER — Observation Stay (HOSPITAL_COMMUNITY)
Admission: EM | Admit: 2021-01-31 | Discharge: 2021-02-03 | Disposition: A | Discharge status: Home / Self Care | Payer: Medicare PPO | Attending: Family Medicine | Admitting: Family Medicine

## 2021-01-31 ENCOUNTER — Encounter (HOSPITAL_COMMUNITY): Payer: Self-pay | Admitting: Emergency Medicine

## 2021-01-31 DIAGNOSIS — R600 Localized edema: Secondary | ICD-10-CM | POA: Diagnosis not present

## 2021-01-31 DIAGNOSIS — N390 Urinary tract infection, site not specified: Secondary | ICD-10-CM | POA: Diagnosis present

## 2021-01-31 DIAGNOSIS — M7989 Other specified soft tissue disorders: Secondary | ICD-10-CM | POA: Insufficient documentation

## 2021-01-31 DIAGNOSIS — Z79899 Other long term (current) drug therapy: Secondary | ICD-10-CM | POA: Diagnosis not present

## 2021-01-31 DIAGNOSIS — M25562 Pain in left knee: Secondary | ICD-10-CM | POA: Insufficient documentation

## 2021-01-31 DIAGNOSIS — Z853 Personal history of malignant neoplasm of breast: Secondary | ICD-10-CM | POA: Diagnosis not present

## 2021-01-31 DIAGNOSIS — I10 Essential (primary) hypertension: Secondary | ICD-10-CM | POA: Diagnosis present

## 2021-01-31 DIAGNOSIS — B9620 Unspecified Escherichia coli [E. coli] as the cause of diseases classified elsewhere: Secondary | ICD-10-CM | POA: Insufficient documentation

## 2021-01-31 DIAGNOSIS — N3001 Acute cystitis with hematuria: Secondary | ICD-10-CM | POA: Insufficient documentation

## 2021-01-31 DIAGNOSIS — R531 Weakness: Secondary | ICD-10-CM

## 2021-01-31 DIAGNOSIS — M25462 Effusion, left knee: Secondary | ICD-10-CM | POA: Diagnosis not present

## 2021-01-31 DIAGNOSIS — U071 COVID-19: Principal | ICD-10-CM | POA: Diagnosis present

## 2021-01-31 DIAGNOSIS — I82409 Acute embolism and thrombosis of unspecified deep veins of unspecified lower extremity: Secondary | ICD-10-CM | POA: Diagnosis present

## 2021-01-31 DIAGNOSIS — R319 Hematuria, unspecified: Secondary | ICD-10-CM | POA: Diagnosis not present

## 2021-01-31 DIAGNOSIS — I700 Atherosclerosis of aorta: Secondary | ICD-10-CM | POA: Diagnosis not present

## 2021-01-31 DIAGNOSIS — Z743 Need for continuous supervision: Secondary | ICD-10-CM | POA: Diagnosis not present

## 2021-01-31 DIAGNOSIS — L8990 Pressure ulcer of unspecified site, unspecified stage: Secondary | ICD-10-CM | POA: Insufficient documentation

## 2021-01-31 LAB — URINALYSIS, ROUTINE W REFLEX MICROSCOPIC
Bilirubin Urine: NEGATIVE
Glucose, UA: NEGATIVE mg/dL
Ketones, ur: NEGATIVE mg/dL
Nitrite: POSITIVE — AB
Protein, ur: NEGATIVE mg/dL
Specific Gravity, Urine: 1.023 (ref 1.005–1.030)
WBC, UA: >50 WBC/hpf — ABNORMAL HIGH (ref 0–5)
pH: 5 (ref 5.0–8.0)

## 2021-01-31 LAB — COMPREHENSIVE METABOLIC PANEL
ALT: 18 U/L (ref 0–44)
AST: 22 U/L (ref 15–41)
Albumin: 3.5 g/dL (ref 3.5–5.0)
Alkaline Phosphatase: 63 U/L (ref 38–126)
Anion gap: 5 (ref 5–15)
BUN: 26 mg/dL — ABNORMAL HIGH (ref 8–23)
CO2: 27 mmol/L (ref 22–32)
Calcium: 8.9 mg/dL (ref 8.9–10.3)
Chloride: 108 mmol/L (ref 98–111)
Creatinine, Ser: 0.7 mg/dL (ref 0.44–1.00)
GFR, Estimated: >60 mL/min (ref >60)
Glucose, Bld: 109 mg/dL — ABNORMAL HIGH (ref 70–99)
Potassium: 4 mmol/L (ref 3.5–5.1)
Sodium: 140 mmol/L (ref 135–145)
Total Bilirubin: 0.8 mg/dL (ref 0.3–1.2)
Total Protein: 6.4 g/dL — ABNORMAL LOW (ref 6.5–8.1)

## 2021-01-31 LAB — CBC WITH DIFFERENTIAL/PLATELET
Abs Immature Granulocytes: 0.03 10*3/uL (ref 0.00–0.07)
Basophils Absolute: 0 10*3/uL (ref 0.0–0.1)
Basophils Relative: 1 %
Eosinophils Absolute: 0.1 10*3/uL (ref 0.0–0.5)
Eosinophils Relative: 1 %
HCT: 36.7 % (ref 36.0–46.0)
Hemoglobin: 11.9 g/dL — ABNORMAL LOW (ref 12.0–15.0)
Immature Granulocytes: 1 %
Lymphocytes Relative: 11 %
Lymphs Abs: 0.7 10*3/uL (ref 0.7–4.0)
MCH: 32.9 pg (ref 26.0–34.0)
MCHC: 32.4 g/dL (ref 30.0–36.0)
MCV: 101.4 fL — ABNORMAL HIGH (ref 80.0–100.0)
Monocytes Absolute: 0.6 10*3/uL (ref 0.1–1.0)
Monocytes Relative: 9 %
Neutro Abs: 4.9 10*3/uL (ref 1.7–7.7)
Neutrophils Relative %: 77 %
Platelets: 85 10*3/uL — ABNORMAL LOW (ref 150–400)
RBC: 3.62 MIL/uL — ABNORMAL LOW (ref 3.87–5.11)
RDW: 13.9 % (ref 11.5–15.5)
WBC: 6.3 10*3/uL (ref 4.0–10.5)
nRBC: 0 % (ref 0.0–0.2)

## 2021-01-31 LAB — RESP PANEL BY RT-PCR (FLU A&B, COVID) ARPGX2
Influenza A by PCR: NEGATIVE
Influenza B by PCR: NEGATIVE
SARS Coronavirus 2 by RT PCR: POSITIVE — AB

## 2021-01-31 LAB — MAGNESIUM: Magnesium: 2.1 mg/dL (ref 1.7–2.4)

## 2021-01-31 LAB — PHOSPHORUS: Phosphorus: 2.7 mg/dL (ref 2.5–4.6)

## 2021-01-31 LAB — D-DIMER, QUANTITATIVE: D-Dimer, Quant: >20 ug/mL-FEU — ABNORMAL HIGH (ref 0.00–0.50)

## 2021-01-31 LAB — CBG MONITORING, ED: Glucose-Capillary: 103 mg/dL — ABNORMAL HIGH (ref 70–99)

## 2021-01-31 LAB — FERRITIN: Ferritin: 192 ng/mL (ref 11–307)

## 2021-01-31 LAB — CK: Total CK: 46 U/L (ref 38–234)

## 2021-01-31 LAB — C-REACTIVE PROTEIN: CRP: 7.6 mg/dL — ABNORMAL HIGH (ref <1.0)

## 2021-01-31 MED ORDER — SULFAMETHOXAZOLE-TRIMETHOPRIM 800-160 MG PO TABS
1.0000 | ORAL_TABLET | Freq: Once | ORAL | Status: AC
  Administered 2021-01-31: 1 via ORAL
  Filled 2021-01-31: qty 1

## 2021-01-31 MED ORDER — ONDANSETRON HCL 4 MG/2ML IJ SOLN
4.0000 mg | Freq: Four times a day (QID) | INTRAMUSCULAR | Status: DC | PRN
  Filled 2021-01-31: qty 2

## 2021-01-31 MED ORDER — SODIUM CHLORIDE 0.9 % IV BOLUS
500.0000 mL | Freq: Once | INTRAVENOUS | Status: AC
  Administered 2021-01-31: 500 mL via INTRAVENOUS

## 2021-01-31 MED ORDER — ENOXAPARIN SODIUM 40 MG/0.4ML IJ SOSY
40.0000 mg | PREFILLED_SYRINGE | INTRAMUSCULAR | Status: DC
  Administered 2021-01-31 – 2021-02-01 (×2): 40 mg via SUBCUTANEOUS
  Filled 2021-01-31 (×2): qty 0.4

## 2021-01-31 MED ORDER — SODIUM CHLORIDE 0.9 % IV SOLN: INTRAVENOUS | Status: AC

## 2021-01-31 MED ORDER — SODIUM CHLORIDE 0.9 % IV SOLN
1.0000 g | Freq: Three times a day (TID) | INTRAVENOUS | Status: DC
  Administered 2021-01-31 – 2021-02-01 (×2): 1 g via INTRAVENOUS
  Filled 2021-01-31 (×8): qty 1

## 2021-01-31 MED ORDER — ONDANSETRON HCL 4 MG PO TABS: 4.0000 mg | ORAL_TABLET | Freq: Four times a day (QID) | ORAL | Status: DC | PRN

## 2021-01-31 MED ORDER — SODIUM CHLORIDE 0.9 % IV SOLN: INTRAVENOUS | Status: DC

## 2021-01-31 MED ORDER — NIRMATRELVIR/RITONAVIR (PAXLOVID) TABLET (RENAL DOSING)
2.0000 | ORAL_TABLET | Freq: Two times a day (BID) | ORAL | Status: DC
Start: 2021-01-31 — End: 2021-02-03
  Administered 2021-01-31 – 2021-02-02 (×5): 2 via ORAL
  Filled 2021-01-31: qty 20

## 2021-01-31 MED ORDER — POLYETHYLENE GLYCOL 3350 17 G PO PACK
17.0000 g | PACK | Freq: Every day | ORAL | Status: DC | PRN
  Administered 2021-02-01: 17 g via ORAL
  Filled 2021-01-31: qty 1

## 2021-01-31 MED ORDER — CIPROFLOXACIN IN D5W 400 MG/200ML IV SOLN
400.0000 mg | Freq: Once | INTRAVENOUS | Status: AC
  Administered 2021-01-31: 400 mg via INTRAVENOUS
  Filled 2021-01-31: qty 200

## 2021-01-31 MED ORDER — ZINC SULFATE 220 (50 ZN) MG PO CAPS
220.0000 mg | ORAL_CAPSULE | Freq: Every day | ORAL | Status: DC
  Administered 2021-02-01 – 2021-02-03 (×3): 220 mg via ORAL
  Filled 2021-01-31 (×3): qty 1

## 2021-01-31 MED ORDER — ASCORBIC ACID 500 MG PO TABS
500.0000 mg | ORAL_TABLET | Freq: Every day | ORAL | Status: DC
  Administered 2021-02-01 – 2021-02-03 (×3): 500 mg via ORAL
  Filled 2021-01-31 (×3): qty 1

## 2021-01-31 NOTE — H&P (Signed)
History and Physical    WILLOLA EVOLA [id number] DOB: 11-30-1940 DOA: 01/31/2021  PCP: Celene Squibb, MD   Patient coming from: Home  I have personally briefly reviewed patient's old medical records in [place]  Chief Complaint: Weakness  HPI: Tammy Terry is a 80 y.o. adult with medical history significant for stage III breast cancer, depression and anxiety, fatty liver, hypertension. Patient was brought to the ED via EMS reports that patient has been sitting in a chair for 5 days.  Patient is awake alert and oriented and able to answer questions appropriately.  Patient reports she was not able to get out of the chair without her assistance.  She has been weak all over.  Denies body aches.  No cough, no difficulty breathing.  No vomiting, no loose stools.  She has received at least 2 doses of the COVID-vaccine, she is unsure if she got a booster also.  ED Course: Stable vitals.  O2 sats greater than 97% on room air.  ED patient was unable to sit up or even roll herself.  Spouse reported he is unable to care for patient.  Patient was also soiled with urine.  CK 46.  UA suggestive of UTI.  EKG without significant change.  COVID test positive.  Cr at baseline 0.7.  Chest Xray- Without acute abnormality.  Left knee x-rays also done due to complaints of knee pain, without acute fracture or dislocation and showed trace pleural knee effusion.  Bactrim initially given with plans for discharge, both later started on IV ciprofloxacin .  Review of Systems: As per HPI all other systems reviewed and negative.  Past Medical History:  Diagnosis Date   Acquired absence of unspecified breast and nipple    Age-related osteoporosis without current pathological fracture    Age-related osteoporosis without current pathological fracture    Anxiety    Anxiety disorder, unspecified    Cancer ([place]) 1994   BREAST CANCER, TREATED WITH RADIATION AND CHEMOTHERAPY   Depression with anxiety 03/01/2012    Essential (primary) hypertension    Fatty (change of) liver, not elsewhere classified    Fatty liver    H/O vitamin D deficiency    Hyperlipidemia 03/01/2012   Hypertension    Insomnia, unspecified    Localized edema    Lumbago    Major depressive disorder, single episode, unspecified    Malignant neoplasm of unspecified site of unspecified female breast ([facility])    Osteoporosis    Other abnormalities of gait and mobility    Polyosteoarthritis, unspecified    Radiculopathy, site unspecified    Rosacea    Syncope and collapse    Tremor, unspecified    Unspecified convulsions ([place])    Unspecified fall, initial encounter     Past Surgical History:  Procedure Laterality Date   [place]   LEFT MASTECTOMY    CAPSULOTOMY  august 2015   of left eye   CATARACT EXTRACTION Left    CESAREAN SECTION     COLONOSCOPY N/A 05/13/2014   Procedure: COLONOSCOPY;  Surgeon: Rogene Houston, MD;  Location: [facility];  Service: Endoscopy;  Laterality: N/A;  63   MASTECTOMY     left breast for cancer in [place]       reports that she has never smoked. She has never used smokeless tobacco. She reports that she does not drink alcohol and does not use drugs.  Allergies  Allergen Reactions   Amoxicillin  Other (See Comments)    Unknown reaction   Codeine     Unknown reaction   Penicillins     Family History  Problem Relation Age of Onset   Heart attack Father    Diabetes Paternal Grandmother     Prior to Admission medications   Medication Sig Start Date End Date Taking? Authorizing Provider  acetaminophen (TYLENOL) 325 MG tablet Take 650 mg by mouth every 6 (six) hours as needed for headache.   Yes [provider]    Physical Exam: Vitals:   01/31/21 1200 01/31/21 1300 01/31/21 1400 01/31/21 1500  BP: (!) 161/74 134/73 102/71 133/79  Pulse: 76 74 76 80  Resp: 16 14 (!) 26 18  Temp:      TempSrc:      SpO2: 100% 100% 100% 97%  Weight:       Height:        Constitutional: NAD, calm, comfortable Vitals:   01/31/21 1200 01/31/21 1300 01/31/21 1400 01/31/21 1500  BP: (!) 161/74 134/73 102/71 133/79  Pulse: 76 74 76 80  Resp: 16 14 (!) 26 18  Temp:      TempSrc:      SpO2: 100% 100% 100% 97%  Weight:      Height:       Eyes: PERRL, lids and conjunctivae normal ENMT: Mucous membranes are moist.   Neck: normal, supple, no masses, no thyromegaly Respiratory: clear to auscultation bilaterally, no wheezing, no crackles. Normal respiratory effort. No accessory muscle use.  Cardiovascular: Regular rate and rhythm, no murmurs / rubs / gallops.  2+ pitting extremity edema bilaterally worse on the left.  Extremity edema. 2+ pedal pulses.   Abdomen: no tenderness, no masses palpated. No hepatosplenomegaly. Bowel sounds positive.  Musculoskeletal: no clubbing / cyanosis. No joint deformity upper and lower extremities. Good ROM, no contractures. Normal muscle tone.  Skin: Dry scaling to bilateral lower extremities, lesions, ulcers. No induration Neurologic: Generalized weakness, 4/5 strength in all extremities, slightly worsen in left lower extremity possibly from worse edema Psychiatric: Normal judgment and insight. Alert and oriented x 3. Normal mood.   Labs on Admission: I have personally reviewed following labs and imaging studies  CBC: Recent Labs  Lab 01/31/21 1043  WBC 6.3  NEUTROABS 4.9  HGB 11.9*  HCT 36.7  MCV 101.4*  PLT 85*   Basic Metabolic Panel: Recent Labs  Lab 01/31/21 1043  NA 140  K 4.0  CL 108  CO2 27  GLUCOSE 109*  BUN 26*  CREATININE 0.70  CALCIUM 8.9   Liver Function Tests: Recent Labs  Lab 01/31/21 1043  AST 22  ALT 18  ALKPHOS 63  BILITOT 0.8  PROT 6.4*  ALBUMIN 3.5   Cardiac Enzymes: Recent Labs  Lab 01/31/21 1043  CKTOTAL 46   CBG: Recent Labs  Lab 01/31/21 1108  GLUCAP 103*   Urine analysis:    Component Value Date/Time   COLORURINE AMBER (A) 01/31/2021 1106    APPEARANCEUR HAZY (A) 01/31/2021 1106   LABSPEC 1.023 01/31/2021 1106   PHURINE 5.0 01/31/2021 1106   GLUCOSEU NEGATIVE 01/31/2021 1106   HGBUR SMALL (A) 01/31/2021 1106   BILIRUBINUR NEGATIVE 01/31/2021 1106   KETONESUR NEGATIVE 01/31/2021 1106   PROTEINUR NEGATIVE 01/31/2021 1106   NITRITE POSITIVE (A) 01/31/2021 1106   LEUKOCYTESUR MODERATE (A) 01/31/2021 1106    Radiological Exams on Admission: DG Chest 2 View  Result Date: 01/31/2021 CLINICAL DATA:  80 year old female with history of weakness. EXAM: CHEST -  2 VIEW COMPARISON:  Chest x-ray 04/03/2018. FINDINGS: Lung volumes are normal. No consolidative airspace disease. No pleural effusions. No pneumothorax. No pulmonary nodule or mass noted. Pulmonary vasculature and the cardiomediastinal silhouette are within normal limits. Atherosclerosis in the thoracic aorta. Surgical clips in the left axillary region, presumably from prior lymph node dissection. IMPRESSION: 1.  No radiographic evidence of acute cardiopulmonary disease. 2. Aortic atherosclerosis. Electronically Signed   By: Vinnie Langton M.D.   On: 01/31/2021 14:14   DG Knee Complete 4 Views Left  Result Date: 01/31/2021 CLINICAL DATA:  Five days of knee pain EXAM: LEFT KNEE - COMPLETE 4+ VIEW COMPARISON:  Left knee radiographs 02/28/2018 FINDINGS: There is no acute fracture or dislocation. Knee alignment is normal. There is a trace knee effusion. There is diffuse soft tissue edema. There is no soft tissue gas. IMPRESSION: No acute fracture or dislocation.  Trace knee effusion. Diffuse soft tissue edema.  No soft tissue gas. Electronically Signed   By: Valetta Mole MD   On: 01/31/2021 14:18    EKG: Independently reviewed.  Sinus rhythm rate 79, QTc 449.  No significant change from prior.  Assessment/Plan Principal Problem:   Generalized weakness Active Problems:   Essential hypertension, benign   COVID-19 virus infection   Acute lower UTI  Generalized weakness likely  secondary to COVID-19 infection and urinary tract infection.  Weak for 5 days. Mental status intact.   -Left lower extremity slightly more weak than others, likely from swelling, re-evaluate - 500 mls given, continue N/s 75cc/hr x 12hrs -Spouse unable to care for patient, may benefit from PT evaluation prior to discharge  COVID-19 infection- no respiratory symptoms.  O2 sats greater than 97% on room air.  Chest x-ray unremarkable. -Obtain and trend inflammatory markers -Pharmacy consult for paxlovid  Bilateral lower extremity swelling-unknown chronicity, complaints of which is likely chronic considering skin changes.  -Bilateral lower extremity venous Dopplers.  Urinary tract infection, rules out for sepsis.  UA suggestive with positive nitrites moderate leukocytes and many bacteria. -Penicillin allergy, patient cannot remember details, per pharmacy has not tolerated cephalosporins in the past -IV aztreonam -Follow-up urine culture  HTN-.  Stable, not on medications.  DVT prophylaxis: Lovenox Code Status: Full code Family Communication: None at bedside Disposition Plan: ~ 1 -2 days Consults called: None Admission status: Obs, Med-surg    Bethena Roys MD [facility]  01/31/2021, 7:43 PM

## 2021-01-31 NOTE — Progress Notes (Signed)
Pharmacy Antibiotic Note  Tammy Terry is a 80 y.o. adult admitted on 01/31/2021 with UTI.  Pharmacy has been consulted for aztreonam dosing.  Patient has unclear allergy history and no listed use of penicillins/cephalosporins.  Plan: Aztreonam 1000 mg IV every 8 hours. Monitor labs, c/s, and patient improvement.  Height: '5\' 6"'$  (167.6 cm) Weight: 70 kg (154 lb 5.2 oz) IBW/kg (Calculated) : 59.3  Temp (24hrs), Avg:98.7 F (37.1 C), Min:98.7 F (37.1 C), Max:98.7 F (37.1 C)  Recent Labs  Lab 01/31/21 1043  WBC 6.3  CREATININE 0.70    Estimated Creatinine Clearance (by C-G formula based on SCr of 0.7 mg/dL) Female: 51.6 mL/min Female: 65.4 mL/min    Allergies  Allergen Reactions   Amoxicillin Other (See Comments)    Unknown reaction   Codeine     Unknown reaction   Penicillins     Antimicrobials this admission: Aztreonam 8/8 >> Cipro 8/8   Microbiology results: 8/8 UCx: pending  COVID +  Thank you for allowing pharmacy to be a part of this patient's care.  Ramond Craver 01/31/2021 3:41 PM

## 2021-01-31 NOTE — ED Triage Notes (Signed)
Pt from home, EMS called out for weakness. Family states that pt has been sitting in her chair for the past 5 days.

## 2021-01-31 NOTE — Progress Notes (Signed)
IS and Flutter instructed and given to patient. Pt attempts and cooperation good. Will need assistance. Nurse informed

## 2021-01-31 NOTE — ED Notes (Signed)
Patient transported to X-ray 

## 2021-01-31 NOTE — Progress Notes (Signed)
Patient arrived to floor.  Patient has bilateral flaky skin/scaling to lower extremities.  Patient has MASD to right abdominal fold/groin area.  Patient has left masectomy.  Patient also has 2 stage 2 to buttocks.  One stage 2 measuring 2cm x 2 cm to right buttock and one measuring 2 cm x .01 cm to right hip.  Patient has abrasions to buttocks as well, and some MASD to buttock region.

## 2021-01-31 NOTE — ED Notes (Signed)
Date and time results received: 01/31/21  (use smartphrase ".now" to insert current time)  Test: covid  Critical Value: positive  Name of Provider Notified: Idol  Orders Received? Or Actions Taken?: n/a

## 2021-01-31 NOTE — ED Notes (Signed)
Pt returned from xray

## 2021-01-31 NOTE — ED Provider Notes (Signed)
[facility] EMERGENCY DEPARTMENT Provider Note   CSN: [id number] Arrival date & time: 01/31/21  0937     History Chief Complaint  Patient presents with   Weakness    Tammy Terry is a 80 y.o. adult with a history as outlined below, most significant for history of anxiety and depression, hypertension, hyperlipidemia presenting for further evaluation of generalized weakness.  Apparently her husband called EMS and she was transported here with complaints of not having moved out of her recliner chair for the past 5 days.  When patient is asked why she has been in her chair for this long she stated "I do not know I guess I am just lazy".  She denies any physical complaints including chest pain, shortness of breath, abdominal pain, nausea or vomiting, dysuria.  She denies pain with attempts at ambulation.  Denies fevers or chills or any other concerns.  Attempt to contact husband at home fall with no answer.  The history is provided by the patient.      Past Medical History:  Diagnosis Date   Acquired absence of unspecified breast and nipple    Age-related osteoporosis without current pathological fracture    Age-related osteoporosis without current pathological fracture    Anxiety    Anxiety disorder, unspecified    Cancer ([place]) 1994   BREAST CANCER, TREATED WITH RADIATION AND CHEMOTHERAPY   Depression with anxiety 03/01/2012   Essential (primary) hypertension    Fatty (change of) liver, not elsewhere classified    Fatty liver    H/O vitamin D deficiency    Hyperlipidemia 03/01/2012   Hypertension    Insomnia, unspecified    Localized edema    Lumbago    Major depressive disorder, single episode, unspecified    Malignant neoplasm of unspecified site of unspecified female breast ([place])    Osteoporosis    Other abnormalities of gait and mobility    Polyosteoarthritis, unspecified    Radiculopathy, site unspecified    Rosacea    Syncope and collapse    Tremor, unspecified     Unspecified convulsions ([place])    Unspecified fall, initial encounter     Patient Active Problem List   Diagnosis Date Noted   Generalized weakness 01/31/2021   Age-related osteoporosis without current pathological fracture    Polyosteoarthritis, unspecified    Acquired absence of unspecified breast and nipple    Anxiety disorder, unspecified    Major depressive disorder, single episode, unspecified    Essential (primary) hypertension    Fatty (change of) liver, not elsewhere classified    Insomnia, unspecified    Localized edema    Malignant neoplasm of unspecified site of unspecified female breast ([place])    Other abnormalities of gait and mobility    Radiculopathy, site unspecified    Syncope and collapse    Tremor, unspecified    Unspecified convulsions ([place])    Unspecified fall, initial encounter    Cellulitis and abscess of left leg 04/01/2018   Cellulitis of left leg 04/01/2018   Osteoporosis    Abdominal distention 02/03/2015   Osteopenia 03/18/2013   Essential hypertension, benign 10/22/2012   Fatty liver 10/22/2012   Elevated liver enzymes 10/22/2012   Breast cancer, stage 3 ([place]) 03/01/2012   Depression with anxiety 03/01/2012   Hyperlipidemia 03/01/2012    Past Surgical History:  Procedure Laterality Date   BREAST SURGERY  1994   LEFT MASTECTOMY    CAPSULOTOMY  august 2015   of left eye   CATARACT  EXTRACTION Left    CESAREAN SECTION     COLONOSCOPY N/A 05/13/2014   Procedure: COLONOSCOPY;  Surgeon: Rogene Houston, MD;  Location: [facility];  Service: Endoscopy;  Laterality: N/A;  31   MASTECTOMY     left breast for cancer in [place]       OB History     Gravida  1   Para  1   Term      Preterm      AB      Living  1      SAB      IAB      Ectopic      Multiple      Live Births              Family History  Problem Relation Age of Onset   Heart attack Father    Diabetes Paternal Grandmother     Social  History   Tobacco Use   Smoking status: Never   Smokeless tobacco: Never  Substance Use Topics   Alcohol use: No    Alcohol/week: 0.0 standard drinks   Drug use: No    Home Medications Prior to Admission medications   Medication Sig Start Date End Date Taking? Authorizing Provider  acetaminophen (TYLENOL) 325 MG tablet Take 650 mg by mouth every 6 (six) hours as needed for headache.   Yes [provider]    Allergies    Amoxicillin, Codeine, and Penicillins  Review of Systems   Review of Systems  Constitutional:  Negative for chills and fever.  HENT:  Negative for congestion.   Eyes: Negative.   Respiratory:  Negative for chest tightness and shortness of breath.   Cardiovascular:  Negative for chest pain.  Gastrointestinal:  Negative for abdominal pain and nausea.  Genitourinary: Negative.  Negative for dysuria.       Incontinence without dysuria.   Musculoskeletal:  Negative for arthralgias, joint swelling and neck pain.  Skin: Negative.  Negative for rash and wound.  Neurological:  Positive for weakness. Negative for dizziness, light-headedness, numbness and headaches.  Psychiatric/Behavioral: Negative.     Physical Exam Updated Vital Signs BP 133/79   Pulse 80   Temp 98.7 F (37.1 C) (Oral)   Resp 18   Ht '5\' 6"'$  (1.676 m)   Wt 70 kg   SpO2 97%   BMI 24.91 kg/m   Physical Exam Vitals and nursing note reviewed.  Constitutional:      General: She is not in acute distress.    Appearance: She is well-developed. She is not ill-appearing.     Comments: Patient is disheveled, dried urine on both legs to feet, depends appear to be fully saturated, there are some stool smears on the sheet as well.  HENT:     Head: Normocephalic and atraumatic.     Mouth/Throat:     Mouth: Mucous membranes are dry.  Eyes:     Conjunctiva/sclera: Conjunctivae normal.  Cardiovascular:     Rate and Rhythm: Normal rate and regular rhythm.     Heart sounds: Normal heart  sounds.  Pulmonary:     Effort: Pulmonary effort is normal.     Breath sounds: Normal breath sounds. No wheezing.  Abdominal:     General: Bowel sounds are normal.     Palpations: Abdomen is soft.     Tenderness: There is no abdominal tenderness. There is no guarding or rebound.  Musculoskeletal:  General: Swelling and tenderness present. No deformity.     Cervical back: Normal range of motion.     Left knee: Bony tenderness present. No erythema. Decreased range of motion.     Comments: Bilateral lower extremity edema.   Skin:    General: Skin is warm and dry.     Coloration: Skin is pale.     Comments: Scaling/crusting on lower legs and feet and toes.    Neurological:     Mental Status: She is alert and oriented to person, place, and time.     Cranial Nerves: No cranial nerve deficit.     Comments: No apparent confusion on initial exam. A&O x 3.    Psychiatric:        Mood and Affect: Mood normal.    ED Results / Procedures / Treatments   Labs (all labs ordered are listed, but only abnormal results are displayed) Labs Reviewed  RESP PANEL BY RT-PCR (FLU A&B, COVID) ARPGX2 - Abnormal; Notable for the following components:      Result Value   SARS Coronavirus 2 by RT PCR POSITIVE (*)    All other components within normal limits  URINALYSIS, ROUTINE W REFLEX MICROSCOPIC - Abnormal; Notable for the following components:   Color, Urine AMBER (*)    APPearance HAZY (*)    Hgb urine dipstick SMALL (*)    Nitrite POSITIVE (*)    Leukocytes,Ua MODERATE (*)    WBC, UA >50 (*)    Bacteria, UA MANY (*)    All other components within normal limits  COMPREHENSIVE METABOLIC PANEL - Abnormal; Notable for the following components:   Glucose, Bld 109 (*)    BUN 26 (*)    Total Protein 6.4 (*)    All other components within normal limits  CBC WITH DIFFERENTIAL/PLATELET - Abnormal; Notable for the following components:   RBC 3.62 (*)    Hemoglobin 11.9 (*)    MCV 101.4 (*)     Platelets 85 (*)    All other components within normal limits  CBG MONITORING, ED - Abnormal; Notable for the following components:   Glucose-Capillary 103 (*)    All other components within normal limits  URINE CULTURE  CK    EKG EKG Interpretation  Date/Time:  Monday January 31 2021 11:06:31 EDT Ventricular Rate:  79 PR Interval:  144 QRS Duration: 94 QT Interval:  391 QTC Calculation: 449 R Axis:   30 Text Interpretation: Sinus rhythm Probable LVH with secondary repol abnrm Abnormal inferior Q waves Confirmed by Thamas Jaegers ([id number]) on 01/31/2021 12:41:46 PM  Radiology DG Chest 2 View  Result Date: 01/31/2021 CLINICAL DATA:  80 year old female with history of weakness. EXAM: CHEST - 2 VIEW COMPARISON:  Chest x-ray 04/03/2018. FINDINGS: Lung volumes are normal. No consolidative airspace disease. No pleural effusions. No pneumothorax. No pulmonary nodule or mass noted. Pulmonary vasculature and the cardiomediastinal silhouette are within normal limits. Atherosclerosis in the thoracic aorta. Surgical clips in the left axillary region, presumably from prior lymph node dissection. IMPRESSION: 1.  No radiographic evidence of acute cardiopulmonary disease. 2. Aortic atherosclerosis. Electronically Signed   By: Vinnie Langton M.D.   On: 01/31/2021 14:14   DG Knee Complete 4 Views Left  Result Date: 01/31/2021 CLINICAL DATA:  Five days of knee pain EXAM: LEFT KNEE - COMPLETE 4+ VIEW COMPARISON:  Left knee radiographs 02/28/2018 FINDINGS: There is no acute fracture or dislocation. Knee alignment is normal. There is a trace knee effusion. There is  diffuse soft tissue edema. There is no soft tissue gas. IMPRESSION: No acute fracture or dislocation.  Trace knee effusion. Diffuse soft tissue edema.  No soft tissue gas. Electronically Signed   By: Valetta Mole MD   On: 01/31/2021 14:18    Procedures Procedures   Medications Ordered in ED Medications  aztreonam (AZACTAM) 1 g in sodium chloride 0.9  % 100 mL IVPB (has no administration in time range)  sodium chloride 0.9 % bolus 500 mL (0 mLs Intravenous Stopped 01/31/21 1226)  sulfamethoxazole-trimethoprim (BACTRIM DS) 800-160 MG per tablet 1 tablet (1 tablet Oral Given 01/31/21 1224)  ciprofloxacin (CIPRO) IVPB 400 mg (400 mg Intravenous New Bag/Given 01/31/21 1343)    ED Course  I have reviewed the triage vital signs and the nursing notes.  Pertinent labs & imaging results that were available during my care of the patient were reviewed by me and considered in my medical decision making (see chart for details).    MDM Rules/Calculators/A&P                           At reexam, patient expresses left knee pain.  She denies any injuries or falls.  Husband is now at the bedside and states that he has been unable to get her to ambulate for the past 5 days and he is unable to take care of her in his home currently. She has been sitting in a chair in the living room for the past 5 days with minimal movement otherwise.   She has been incontinent of urine x5 days which is not a chronic finding.    Attempt to ambulate patient, however she was unable even to sit up or roll by herself secondary to weakness.  She will need admission for treatment of her UTI, PT eval would be helpful to determine patient's safety of returning to her home at this time.  Sent for left knee imaging, no acute injury, but there is a small effusion. No increased warmth, doubt joint infection.    3:46 PM Pt's screening covid test is positive.   Discussed with Dr. Denton Brick who accepts pt for admission.  Tammy Terry was evaluated in Emergency Department on 01/31/2021 for the symptoms described in the history of present illness. She was evaluated in the context of the global COVID-19 pandemic, which necessitated consideration that the patient might be at risk for infection with the SARS-CoV-2 virus that causes COVID-19. Institutional protocols and algorithms that pertain to the  evaluation of patients at risk for COVID-19 are in a state of rapid change based on information released by regulatory bodies including the CDC and federal and state organizations. These policies and algorithms were followed during the patient's care in the ED.    Final Clinical Impression(s) / ED Diagnoses Final diagnoses:  Acute cystitis with hematuria  Generalized weakness  Acute pain of left knee  COVID-19    Rx / DC Orders ED Discharge Orders     None        Landis Martins 01/31/21 1547    Luna Fuse, MD 02/05/21 1506

## 2021-02-01 ENCOUNTER — Observation Stay (HOSPITAL_COMMUNITY): Payer: Medicare PPO

## 2021-02-01 DIAGNOSIS — I10 Essential (primary) hypertension: Secondary | ICD-10-CM | POA: Diagnosis not present

## 2021-02-01 DIAGNOSIS — R600 Localized edema: Secondary | ICD-10-CM | POA: Diagnosis not present

## 2021-02-01 DIAGNOSIS — M7989 Other specified soft tissue disorders: Secondary | ICD-10-CM | POA: Diagnosis not present

## 2021-02-01 DIAGNOSIS — I82431 Acute embolism and thrombosis of right popliteal vein: Secondary | ICD-10-CM | POA: Diagnosis not present

## 2021-02-01 DIAGNOSIS — L8942 Pressure ulcer of contiguous site of back, buttock and hip, stage 2: Secondary | ICD-10-CM

## 2021-02-01 DIAGNOSIS — L8990 Pressure ulcer of unspecified site, unspecified stage: Secondary | ICD-10-CM | POA: Insufficient documentation

## 2021-02-01 DIAGNOSIS — I82411 Acute embolism and thrombosis of right femoral vein: Secondary | ICD-10-CM | POA: Diagnosis not present

## 2021-02-01 DIAGNOSIS — R531 Weakness: Secondary | ICD-10-CM | POA: Diagnosis not present

## 2021-02-01 DIAGNOSIS — U071 COVID-19: Secondary | ICD-10-CM | POA: Diagnosis not present

## 2021-02-01 DIAGNOSIS — N390 Urinary tract infection, site not specified: Secondary | ICD-10-CM | POA: Diagnosis not present

## 2021-02-01 LAB — CBC WITH DIFFERENTIAL/PLATELET
Abs Immature Granulocytes: 0.02 10*3/uL (ref 0.00–0.07)
Basophils Absolute: 0 10*3/uL (ref 0.0–0.1)
Basophils Relative: 0 %
Eosinophils Absolute: 0.1 10*3/uL (ref 0.0–0.5)
Eosinophils Relative: 2 %
HCT: 31.2 % — ABNORMAL LOW (ref 36.0–46.0)
Hemoglobin: 9.9 g/dL — ABNORMAL LOW (ref 12.0–15.0)
Immature Granulocytes: 0 %
Lymphocytes Relative: 13 %
Lymphs Abs: 0.6 10*3/uL — ABNORMAL LOW (ref 0.7–4.0)
MCH: 32.5 pg (ref 26.0–34.0)
MCHC: 31.7 g/dL (ref 30.0–36.0)
MCV: 102.3 fL — ABNORMAL HIGH (ref 80.0–100.0)
Monocytes Absolute: 0.4 10*3/uL (ref 0.1–1.0)
Monocytes Relative: 9 %
Neutro Abs: 3.6 10*3/uL (ref 1.7–7.7)
Neutrophils Relative %: 76 %
Platelets: 72 10*3/uL — ABNORMAL LOW (ref 150–400)
RBC: 3.05 MIL/uL — ABNORMAL LOW (ref 3.87–5.11)
RDW: 13.7 % (ref 11.5–15.5)
WBC: 4.7 10*3/uL (ref 4.0–10.5)
nRBC: 0 % (ref 0.0–0.2)

## 2021-02-01 LAB — PHOSPHORUS: Phosphorus: 2.4 mg/dL — ABNORMAL LOW (ref 2.5–4.6)

## 2021-02-01 LAB — COMPREHENSIVE METABOLIC PANEL
ALT: 14 U/L (ref 0–44)
AST: 16 U/L (ref 15–41)
Albumin: 2.8 g/dL — ABNORMAL LOW (ref 3.5–5.0)
Alkaline Phosphatase: 52 U/L (ref 38–126)
Anion gap: 5 (ref 5–15)
BUN: 18 mg/dL (ref 8–23)
CO2: 25 mmol/L (ref 22–32)
Calcium: 8.4 mg/dL — ABNORMAL LOW (ref 8.9–10.3)
Chloride: 112 mmol/L — ABNORMAL HIGH (ref 98–111)
Creatinine, Ser: 0.6 mg/dL (ref 0.44–1.00)
GFR, Estimated: >60 mL/min (ref >60)
Glucose, Bld: 92 mg/dL (ref 70–99)
Potassium: 3.9 mmol/L (ref 3.5–5.1)
Sodium: 142 mmol/L (ref 135–145)
Total Bilirubin: 1.1 mg/dL (ref 0.3–1.2)
Total Protein: 5.4 g/dL — ABNORMAL LOW (ref 6.5–8.1)

## 2021-02-01 LAB — D-DIMER, QUANTITATIVE: D-Dimer, Quant: >20 ug/mL-FEU — ABNORMAL HIGH (ref 0.00–0.50)

## 2021-02-01 LAB — MAGNESIUM: Magnesium: 1.9 mg/dL (ref 1.7–2.4)

## 2021-02-01 LAB — FERRITIN: Ferritin: 190 ng/mL (ref 11–307)

## 2021-02-01 LAB — C-REACTIVE PROTEIN: CRP: 8.7 mg/dL — ABNORMAL HIGH (ref <1.0)

## 2021-02-01 MED ORDER — SODIUM CHLORIDE 0.9 % IV SOLN: INTRAVENOUS | Status: AC

## 2021-02-01 MED ORDER — SODIUM CHLORIDE 0.9 % IV SOLN
1.0000 g | INTRAVENOUS | Status: DC
  Administered 2021-02-01 – 2021-02-02 (×2): 1 g via INTRAVENOUS
  Filled 2021-02-01 (×2): qty 10

## 2021-02-01 NOTE — Progress Notes (Signed)
PROGRESS NOTE    Tammy Terry  [id number] DOB: 09-Aug-1940 DOA: 01/31/2021 PCP: Celene Squibb, MD   Chief Complaint  Patient presents with   Weakness    Brief admission Narrative:  As per H&P written by Dr. Denton Brick on 8/822 Tammy Terry is a 80 y.o. adult with medical history significant for stage III breast cancer, depression and anxiety, fatty liver, hypertension. Patient was brought to the ED via EMS reports that patient has been sitting in a chair for 5 days.  Patient is awake alert and oriented and able to answer questions appropriately.  Patient reports she was not able to get out of the chair without her assistance.  She has been weak all over.  Denies body aches.  No cough, no difficulty breathing.  No vomiting, no loose stools.  She has received at least 2 doses of the COVID-vaccine, she is unsure if she got a booster also.   ED Course: Stable vitals.  O2 sats greater than 97% on room air.  ED patient was unable to sit up or even roll herself.  Spouse reported he is unable to care for patient.  Patient was also soiled with urine.  CK 46.  UA suggestive of UTI.  EKG without significant change.  COVID test positive.  Cr at baseline 0.7.  Chest Xray- Without acute abnormality.  Left knee x-rays also done due to complaints of knee pain, without acute fracture or dislocation and showed trace pleural knee effusion.  Bactrim initially given with plans for discharge, both later started on IV ciprofloxacin .    Assessment & Plan: 1-generalized weakness/physical deconditioning  -With concern for UTI/COVID infection, dehydration and physical deconditioning -Continue IV antibiotics and follow cultures -Patient will complete treatment with oral paxlovid and will continue providing supportive care. -Patient is lacking of proper social support currently as her husband is also elderly and unable to physically care for her. -Physical therapy consultation in place -TOC has been made aware and  will follow recommendations and needs.  2-UTI -culture pending -continue adequate hydration -continue empiric antibiotics  3-Essential hypertension, benign -stable overall -follow VS  4-COVID-19 virus infection -No requiring oxygen supplementation -Chest x-ray without infiltrates -Continue treatment with paxlovid. -Follow inflammatory markers and continue the use of zinc and vitamin C.      DVT prophylaxis: Lovenox Code Status: Full code Family Communication: No family at bedside.  Case discussed in details with patient. Disposition:   Status is: Observation  The patient will require care spanning > 2 midnights and should be moved to inpatient because: Unsafe d/c plan and IV treatments appropriate due to intensity of illness or inability to take PO  Dispo: The patient is from: Home              Anticipated d/c is to: SNF              Patient currently is not medically stable to d/c.   Difficult to place patient No       Consultants:  None   Procedures: See below for x-ray reports  Antimicrobials/antiviral:  Rocephin Paxlovid   Subjective: No chest pain, no nausea, no vomiting, no fever, no using accessory muscle.  Good saturation on room air.  Reports feeling weak, tired and severely deconditioned.  Patient is chronically ill and frail on appearance.  Objective: Vitals:   01/31/21 2206 02/01/21 0453 02/01/21 0828 02/01/21 1312  BP: (!) 151/62 (!) 158/81 (!) 149/69 (!) 149/72  Pulse: 74 82 73  79  Resp: '16 14 18 18  '$ Temp: 98.5 F (36.9 C) 98.3 F (36.8 C) 97.9 F (36.6 C) 98.3 F (36.8 C)  TempSrc: Oral Oral Oral Oral  SpO2: 100% 100% 100% 100%  Weight:      Height:        Intake/Output Summary (Last 24 hours) at 02/01/2021 1727 Last data filed at 02/01/2021 1500 Gross per 24 hour  Intake 1049.38 ml  Output --  Net 1049.38 ml   Filed Weights   01/31/21 0948  Weight: 70 kg    Examination: General exam: Appears calm and comfortable, oriented x3  currently; no chest pain, no nausea, no vomiting, reports no shortness of breath. Respiratory system: Good air movement bilaterally; no using accessory muscle.  No wheezing or crackles. Cardiovascular system: S1 & S2 heard, RRR. No rubs or gallops; no JVD. Gastrointestinal system: Abdomen is nondistended, soft and nontender. No organomegaly or masses felt. Normal bowel sounds heard. Central nervous system: No focal neurological deficits.  Expressed diffuse generalized weakness and deconditioning. Extremities: No cyanosis or clubbing. Skin: No petechiae; patient with stage II pressure injury appreciated in her right buttocks and also in her right hip area; no signs of superimposed infection.  Injuries were present at time of admission. Psychiatry: Mood & affect appropriate.     Data Reviewed: I have personally reviewed following labs and imaging studies  CBC: Recent Labs  Lab 01/31/21 1043 02/01/21 0609  WBC 6.3 4.7  NEUTROABS 4.9 3.6  HGB 11.9* 9.9*  HCT 36.7 31.2*  MCV 101.4* 102.3*  PLT 85* 72*    Basic Metabolic Panel: Recent Labs  Lab 01/31/21 1043 01/31/21 2001 02/01/21 0609  NA 140  --  142  K 4.0  --  3.9  CL 108  --  112*  CO2 27  --  25  GLUCOSE 109*  --  92  BUN 26*  --  18  CREATININE 0.70  --  0.60  CALCIUM 8.9  --  8.4*  MG  --  2.1 1.9  PHOS  --  2.7 2.4*    GFR: Estimated Creatinine Clearance (by C-G formula based on SCr of 0.6 mg/dL) Female: 51.6 mL/min Female: 65.4 mL/min  Liver Function Tests: Recent Labs  Lab 01/31/21 1043 02/01/21 0609  AST 22 16  ALT 18 14  ALKPHOS 63 52  BILITOT 0.8 1.1  PROT 6.4* 5.4*  ALBUMIN 3.5 2.8*    CBG: Recent Labs  Lab 01/31/21 1108  GLUCAP 103*     Recent Results (from the past 240 hour(s))  Urine Culture     Status: None (Preliminary result)   Collection Time: 01/31/21 11:08 AM   Specimen: In/Out Cath Urine  Result Value Ref Range Status   Specimen Description   Final    IN/OUT CATH  URINE Performed at [facility], [address], [place], [place] [address]    Special Requests   Final    NONE Performed at [facility], [place] [address] [place], [place] [address]    Culture PENDING  Incomplete   Report Status PENDING  Incomplete  Resp Panel by RT-PCR (Flu A&B, Covid) Nasopharyngeal Swab     Status: Abnormal   Collection Time: 01/31/21 12:45 PM   Specimen: Nasopharyngeal Swab; Nasopharyngeal(NP) swabs in vial transport medium  Result Value Ref Range Status   SARS Coronavirus 2 by RT PCR POSITIVE (A) NEGATIVE Final    Comment: CRITICAL RESULT CALLED TO, READ BACK BY AND VERIFIED WITH: COE,A  AT 1520 ON 8.8.22 BY RUCINSKI,B (NOTE) SARS-CoV-2 target nucleic acids are DETECTED.  The SARS-CoV-2 RNA is generally detectable in upper respiratory specimens during the acute phase of infection. Positive results are indicative of the presence of the identified virus, but do not rule out bacterial infection or co-infection with other pathogens not detected by the test. Clinical correlation with patient history and other diagnostic information is necessary to determine patient infection status. The expected result is Negative.  Fact Sheet for Patients: EntrepreneurPulse.com.au  Fact Sheet for Healthcare Providers: IncredibleEmployment.be  This test is not yet approved or cleared by the [place] FDA and  has been authorized for detection and/or diagnosis of SARS-CoV-2 by FDA under an Emergency Use Authorization (EUA).  This EUA will remain in effect (meaning thi s test can be used) for the duration of  the COVID-19 declaration under Section 564(b)(1) of the Act, 21 U.S.C. section 360bbb-3(b)(1), unless the authorization is terminated or revoked sooner.     Influenza A by PCR NEGATIVE NEGATIVE Final   Influenza B by PCR NEGATIVE NEGATIVE Final    Comment: (NOTE) The Xpert Xpress SARS-CoV-2/FLU/RSV plus assay is intended as an  aid in the diagnosis of influenza from Nasopharyngeal swab specimens and should not be used as a sole basis for treatment. Nasal washings and aspirates are unacceptable for Xpert Xpress SARS-CoV-2/FLU/RSV testing.  Fact Sheet for Patients: EntrepreneurPulse.com.au  Fact Sheet for Healthcare Providers: IncredibleEmployment.be  This test is not yet approved or cleared by the [place] FDA and has been authorized for detection and/or diagnosis of SARS-CoV-2 by FDA under an Emergency Use Authorization (EUA). This EUA will remain in effect (meaning this test can be used) for the duration of the COVID-19 declaration under Section 564(b)(1) of the Act, 21 U.S.C. section 360bbb-3(b)(1), unless the authorization is terminated or revoked.  Performed at [facility], [place], [address], [place], [place] [address]      Radiology Studies: DG Chest 2 View  Result Date: 01/31/2021 CLINICAL DATA:  80 year old female with history of weakness. EXAM: CHEST - 2 VIEW COMPARISON:  Chest x-ray 04/03/2018. FINDINGS: Lung volumes are normal. No consolidative airspace disease. No pleural effusions. No pneumothorax. No pulmonary nodule or mass noted. Pulmonary vasculature and the cardiomediastinal silhouette are within normal limits. Atherosclerosis in the thoracic aorta. Surgical clips in the left axillary region, presumably from prior lymph node dissection. IMPRESSION: 1.  No radiographic evidence of acute cardiopulmonary disease. 2. Aortic atherosclerosis. Electronically Signed   By: Vinnie Langton M.D.   On: 01/31/2021 14:14   US Venous Img Lower Bilateral (DVT)  Result Date: 02/01/2021 CLINICAL DATA:  80 year old female with a history bilateral lower extremity swelling EXAM: BILATERAL LOWER EXTREMITY VENOUS DOPPLER ULTRASOUND TECHNIQUE: Gray-scale sonography with graded compression, as well as color Doppler and duplex ultrasound were performed to evaluate the lower  extremity deep venous systems from the level of the common femoral vein and including the common femoral, femoral, profunda femoral, popliteal and calf veins including the posterior tibial, peroneal and gastrocnemius veins when visible. The superficial great saphenous vein was also interrogated. Spectral Doppler was utilized to evaluate flow at rest and with distal augmentation maneuvers in the common femoral, femoral and popliteal veins. COMPARISON:  None. FINDINGS: RIGHT LOWER EXTREMITY Common Femoral Vein: No evidence of thrombus. Normal compressibility, respiratory phasicity and response to augmentation. Saphenofemoral Junction: No evidence of thrombus. Normal compressibility and flow on color Doppler imaging. Profunda Femoral Vein: No evidence of thrombus. Normal compressibility and flow on color  Doppler imaging. Femoral Vein: Occlusive thrombus through the length of the femoral vein no flow and noncompressible. Popliteal Vein: Occlusive thrombus through the popliteal vein, noncompressible with no flow. Calf Veins: No evidence of thrombus. Normal compressibility and flow on color Doppler imaging. Superficial Great Saphenous Vein: No evidence of thrombus. Normal compressibility and flow on color Doppler imaging. Other Findings:  Edema LEFT LOWER EXTREMITY Common Femoral Vein: No evidence of thrombus. Normal compressibility, respiratory phasicity and response to augmentation. Saphenofemoral Junction: No evidence of thrombus. Normal compressibility and flow on color Doppler imaging. Profunda Femoral Vein: No evidence of thrombus. Normal compressibility and flow on color Doppler imaging. Femoral Vein: No evidence of thrombus. Normal compressibility, respiratory phasicity and response to augmentation. Popliteal Vein: No evidence of thrombus. Normal compressibility, respiratory phasicity and response to augmentation. Calf Veins: No evidence of thrombus. Normal compressibility and flow on color Doppler imaging.  Superficial Great Saphenous Vein: No evidence of thrombus. Normal compressibility and flow on color Doppler imaging. Other Findings:  None. IMPRESSION: Sonographic survey of the right lower extremity positive for DVT of the femoral vein and popliteal vein. Sonographic survey of left lower extremity negative DVT Electronically Signed   By: Corrie Mckusick D.O.   On: 02/01/2021 12:44   DG Knee Complete 4 Views Left  Result Date: 01/31/2021 CLINICAL DATA:  Five days of knee pain EXAM: LEFT KNEE - COMPLETE 4+ VIEW COMPARISON:  Left knee radiographs 02/28/2018 FINDINGS: There is no acute fracture or dislocation. Knee alignment is normal. There is a trace knee effusion. There is diffuse soft tissue edema. There is no soft tissue gas. IMPRESSION: No acute fracture or dislocation.  Trace knee effusion. Diffuse soft tissue edema.  No soft tissue gas. Electronically Signed   By: Valetta Mole MD   On: 01/31/2021 14:18     Scheduled Meds:  vitamin C  500 mg Oral Daily   enoxaparin (LOVENOX) injection  40 mg Subcutaneous Q24H   nirmatrelvir/ritonavir EUA (renal dosing)  2 tablet Oral BID   zinc sulfate  220 mg Oral Daily   Continuous Infusions:  sodium chloride     cefTRIAXone (ROCEPHIN)  IV 1 g (02/01/21 1442)     LOS: 0 days    Time spent: 35 minutes   Barton Dubois, MD [facility]   To contact the attending provider between 7A-7P or the covering provider during after hours 7P-7A, please log into the web site [email or web address] and access using universal [place] password for that web site. If you do not have the password, please call the hospital operator.  02/01/2021, 5:27 PM

## 2021-02-01 NOTE — Care Management Obs Status (Addendum)
[place] NOTIFICATION   Patient Details  Name: Tammy Terry MRN: [id number] Date of Birth: [birth year]   Medicare Observation Status Notification Given:  Yes A copy will be mailed to the address on file due to current national emergency   Tommy Medal 02/01/2021, 3:47 PM

## 2021-02-02 DIAGNOSIS — I82409 Acute embolism and thrombosis of unspecified deep veins of unspecified lower extremity: Secondary | ICD-10-CM | POA: Diagnosis present

## 2021-02-02 DIAGNOSIS — R531 Weakness: Secondary | ICD-10-CM | POA: Diagnosis not present

## 2021-02-02 LAB — COMPREHENSIVE METABOLIC PANEL
ALT: 25 U/L (ref 0–44)
AST: 27 U/L (ref 15–41)
Albumin: 2.7 g/dL — ABNORMAL LOW (ref 3.5–5.0)
Alkaline Phosphatase: 68 U/L (ref 38–126)
Anion gap: 4 — ABNORMAL LOW (ref 5–15)
BUN: 14 mg/dL (ref 8–23)
CO2: 25 mmol/L (ref 22–32)
Calcium: 8.3 mg/dL — ABNORMAL LOW (ref 8.9–10.3)
Chloride: 110 mmol/L (ref 98–111)
Creatinine, Ser: 0.56 mg/dL (ref 0.44–1.00)
GFR, Estimated: >60 mL/min (ref >60)
Glucose, Bld: 87 mg/dL (ref 70–99)
Potassium: 3.6 mmol/L (ref 3.5–5.1)
Sodium: 139 mmol/L (ref 135–145)
Total Bilirubin: 0.7 mg/dL (ref 0.3–1.2)
Total Protein: 5.3 g/dL — ABNORMAL LOW (ref 6.5–8.1)

## 2021-02-02 LAB — CBC WITH DIFFERENTIAL/PLATELET
Abs Immature Granulocytes: 0.03 10*3/uL (ref 0.00–0.07)
Basophils Absolute: 0 10*3/uL (ref 0.0–0.1)
Basophils Relative: 1 %
Eosinophils Absolute: 0.1 10*3/uL (ref 0.0–0.5)
Eosinophils Relative: 2 %
HCT: 30.6 % — ABNORMAL LOW (ref 36.0–46.0)
Hemoglobin: 9.9 g/dL — ABNORMAL LOW (ref 12.0–15.0)
Immature Granulocytes: 1 %
Lymphocytes Relative: 18 %
Lymphs Abs: 0.9 10*3/uL (ref 0.7–4.0)
MCH: 32.2 pg (ref 26.0–34.0)
MCHC: 32.4 g/dL (ref 30.0–36.0)
MCV: 99.7 fL (ref 80.0–100.0)
Monocytes Absolute: 0.5 10*3/uL (ref 0.1–1.0)
Monocytes Relative: 10 %
Neutro Abs: 3.4 10*3/uL (ref 1.7–7.7)
Neutrophils Relative %: 68 %
Platelets: 82 10*3/uL — ABNORMAL LOW (ref 150–400)
RBC: 3.07 MIL/uL — ABNORMAL LOW (ref 3.87–5.11)
RDW: 13.6 % (ref 11.5–15.5)
WBC: 5 10*3/uL (ref 4.0–10.5)
nRBC: 0 % (ref 0.0–0.2)

## 2021-02-02 LAB — D-DIMER, QUANTITATIVE: D-Dimer, Quant: 15.93 ug/mL-FEU — ABNORMAL HIGH (ref 0.00–0.50)

## 2021-02-02 LAB — FERRITIN: Ferritin: 202 ng/mL (ref 11–307)

## 2021-02-02 LAB — MAGNESIUM: Magnesium: 2 mg/dL (ref 1.7–2.4)

## 2021-02-02 LAB — C-REACTIVE PROTEIN: CRP: 7.9 mg/dL — ABNORMAL HIGH (ref <1.0)

## 2021-02-02 LAB — PHOSPHORUS: Phosphorus: 2 mg/dL — ABNORMAL LOW (ref 2.5–4.6)

## 2021-02-02 MED ORDER — HYDRALAZINE HCL 20 MG/ML IJ SOLN: 10.0000 mg | Freq: Four times a day (QID) | INTRAMUSCULAR | Status: DC | PRN

## 2021-02-02 MED ORDER — ENOXAPARIN SODIUM 80 MG/0.8ML IJ SOSY
1.0000 mg/kg | PREFILLED_SYRINGE | Freq: Two times a day (BID) | INTRAMUSCULAR | Status: DC
  Administered 2021-02-02 (×2): 70 mg via SUBCUTANEOUS
  Filled 2021-02-02 (×3): qty 0.8

## 2021-02-02 MED ORDER — SODIUM CHLORIDE 0.9 % IV SOLN: INTRAVENOUS | Status: DC

## 2021-02-02 MED ORDER — K PHOS MONO-SOD PHOS DI & MONO 155-852-130 MG PO TABS
250.0000 mg | ORAL_TABLET | Freq: Three times a day (TID) | ORAL | Status: DC
  Administered 2021-02-02 – 2021-02-03 (×3): 250 mg via ORAL
  Filled 2021-02-02 (×3): qty 1

## 2021-02-02 NOTE — Progress Notes (Signed)
PROGRESS NOTE    Tammy Terry  [id number] DOB: [birth year] DOA: 01/31/2021 PCP: Tammy Squibb, MD   Chief Complaint  Patient presents with   Weakness    Brief admission Narrative:  As per H&P written by Dr. Denton Terry on 8/822 Tammy Terry is a 80 y.o. adult with medical history significant for stage III breast cancer, depression and anxiety, fatty liver, hypertension. Patient was brought to the ED via EMS reports that patient has been sitting in a chair for 5 days.  Patient is awake alert and oriented and able to answer questions appropriately.  Patient reports she was not able to get out of the chair without her assistance.  She has been weak all over.  Denies body aches.  No cough, no difficulty breathing.  No vomiting, no loose stools.  She has received at least 2 doses of the COVID-vaccine, she is unsure if she got a booster also.   ED Course: Stable vitals.  O2 sats greater than 97% on room air.  ED patient was unable to sit up or even roll herself.  Spouse reported he is unable to care for patient.  Patient was also soiled with urine.  CK 46.  UA suggestive of UTI.  EKG without significant change.  COVID test positive.  Cr at baseline 0.7.  Chest Xray- Without acute abnormality.  Left knee x-rays also done due to complaints of knee pain, without acute fracture or dislocation and showed trace pleural knee effusion.  Bactrim initially given with plans for discharge, both later started on IV ciprofloxacin .    Assessment & Plan:   1)Rt LE DVT --- patient is probably hypercoagulable in the setting of COVID infection -Venous Dopplers consistent with right femoral and popliteal vein DVT -Therapeutic Lovenox for now, plan to switch to Eliquis upon discharge -Denies chest pains or increased shortness of breath or hypoxia at this time  2) E. coli UTI-continue IV Rocephin pending sensitivities  3) COVID-19 infection--- continue Paxlovid -Chest x-ray without pneumonia -No  hypoxia -Continue zinc and vitamin C  4)Generalized weakness/physical deconditioning --- multifactorial in the setting of UTI COVID infection and DVT -Await physical therapy eval --Patient lives with her elderly husband at home with little support otherwise  5)HTN-- stable,   may use IV Hydralazine 10 mg  Every 4 hours Prn for systolic blood pressure over 160 mmhg  6)HypoPhostamia--- Phosphorus is 2.0, replaced with K-Phos -    DVT prophylaxis: Lovenox Code Status: Full code Family Communication: No family at bedside.  Case discussed in details with patient. Disposition:   Status is: Observation  The patient will require care spanning > 2 midnights and should be moved to inpatient because: Unsafe d/c plan and IV treatments appropriate due to intensity of illness or inability to take PO  Dispo: The patient is from: Home              Anticipated d/c is to: SNF              Patient currently is not medically stable to d/c.   Difficult to place patient No       Consultants:  None   Procedures: See below for x-ray reports  Antimicrobials/antiviral:  Rocephin Paxlovid   Subjective: -Very weak, deconditioned, concerns about occasional disorientation as well -No fevers, no hypoxia -Continues to deny chest pains  Objective: Vitals:   02/01/21 0828 02/01/21 1312 02/01/21 2054 02/02/21 0448  BP: (!) 149/69 (!) 149/72 (!) 150/62 135/75  Pulse:  73 79 79 74  Resp: '18 18 16 18  '$ Temp: 97.9 F (36.6 C) 98.3 F (36.8 C) 98.2 F (36.8 C) 98.2 F (36.8 C)  TempSrc: Oral Oral Oral   SpO2: 100% 100% 100% 100%  Weight:      Height:        Intake/Output Summary (Last 24 hours) at 02/02/2021 1442 Last data filed at 02/02/2021 1300 Gross per 24 hour  Intake 1302.11 ml  Output 900 ml  Net 402.11 ml   Filed Weights   01/31/21 0948  Weight: 70 kg    Examination: General exam: A awake, alert no acute distress,  respiratory system: Good air movement bilaterally; no using  accessory muscle.  No wheezing or crackles. Cardiovascular system: S1 & S2 heard, RRR. No rubs or gallops; no JVD. Gastrointestinal system: Abdomen is nondistended, soft and nontender.  Normal bowel sounds heard. Central nervous system: No focal neurological deficits.  Expressed diffuse generalized weakness and deconditioning. Extremities: -Bilateral lower extremity swelling and tenderness right more than left Skin: patient with stage II pressure injury appreciated in her right buttocks and also in her right hip area; no signs of superimposed infection.  Injuries were present at time of admission. Psychiatry: Mood & affect appropriate.   Data Reviewed: I have personally reviewed following labs and imaging studies  CBC: Recent Labs  Lab 01/31/21 1043 02/01/21 0609 02/02/21 0457  WBC 6.3 4.7 5.0  NEUTROABS 4.9 3.6 3.4  HGB 11.9* 9.9* 9.9*  HCT 36.7 31.2* 30.6*  MCV 101.4* 102.3* 99.7  PLT 85* 72* 82*    Basic Metabolic Panel: Recent Labs  Lab 01/31/21 1043 01/31/21 2001 02/01/21 0609 02/02/21 0457  NA 140  --  142 139  K 4.0  --  3.9 3.6  CL 108  --  112* 110  CO2 27  --  25 25  GLUCOSE 109*  --  92 87  BUN 26*  --  18 14  CREATININE 0.70  --  0.60 0.56  CALCIUM 8.9  --  8.4* 8.3*  MG  --  2.1 1.9 2.0  PHOS  --  2.7 2.4* 2.0*    GFR: Estimated Creatinine Clearance (by C-G formula based on SCr of 0.56 mg/dL) Female: 51.6 mL/min Female: 65.4 mL/min  Liver Function Tests: Recent Labs  Lab 01/31/21 1043 02/01/21 0609 02/02/21 0457  AST '22 16 27  '$ ALT '18 14 25  '$ ALKPHOS 63 52 68  BILITOT 0.8 1.1 0.7  PROT 6.4* 5.4* 5.3*  ALBUMIN 3.5 2.8* 2.7*    CBG: Recent Labs  Lab 01/31/21 1108  GLUCAP 103*     Recent Results (from the past 240 hour(s))  Urine Culture     Status: Abnormal (Preliminary result)   Collection Time: 01/31/21 11:08 AM   Specimen: In/Out Cath Urine  Result Value Ref Range Status   Specimen Description   Final    IN/OUT CATH  URINE Performed at [facility], [address], [place], [place] [address]    Special Requests NONE  Final   Culture (A)  Final    >=100,000 COLONIES/mL ESCHERICHIA COLI SUSCEPTIBILITIES TO FOLLOW Performed at [facility], [address], [place], [place] [address]    Report Status PENDING  Incomplete  Resp Panel by RT-PCR (Flu A&B, Covid) Nasopharyngeal Swab     Status: Abnormal   Collection Time: 01/31/21 12:45 PM   Specimen: Nasopharyngeal Swab; Nasopharyngeal(NP) swabs in vial transport medium  Result Value Ref Range Status   SARS  Coronavirus 2 by RT PCR POSITIVE (A) NEGATIVE Final    Comment: CRITICAL RESULT CALLED TO, READ BACK BY AND VERIFIED WITH: COE,A AT 1520 ON 8.8.22 BY RUCINSKI,B (NOTE) SARS-CoV-2 target nucleic acids are DETECTED.  The SARS-CoV-2 RNA is generally detectable in upper respiratory specimens during the acute phase of infection. Positive results are indicative of the presence of the identified virus, but do not rule out bacterial infection or co-infection with other pathogens not detected by the test. Clinical correlation with patient history and other diagnostic information is necessary to determine patient infection status. The expected result is Negative.  Fact Sheet for Patients: EntrepreneurPulse.com.au  Fact Sheet for Healthcare Providers: IncredibleEmployment.be  This test is not yet approved or cleared by the [place] FDA and  has been authorized for detection and/or diagnosis of SARS-CoV-2 by FDA under an Emergency Use Authorization (EUA).  This EUA will remain in effect (meaning thi s test can be used) for the duration of  the COVID-19 declaration under Section 564(b)(1) of the Act, 21 U.S.C. section 360bbb-3(b)(1), unless the authorization is terminated or revoked sooner.     Influenza A by PCR NEGATIVE NEGATIVE Final   Influenza B by PCR NEGATIVE NEGATIVE Final    Comment: (NOTE) The  Xpert Xpress SARS-CoV-2/FLU/RSV plus assay is intended as an aid in the diagnosis of influenza from Nasopharyngeal swab specimens and should not be used as a sole basis for treatment. Nasal washings and aspirates are unacceptable for Xpert Xpress SARS-CoV-2/FLU/RSV testing.  Fact Sheet for Patients: EntrepreneurPulse.com.au  Fact Sheet for Healthcare Providers: IncredibleEmployment.be  This test is not yet approved or cleared by the [place] FDA and has been authorized for detection and/or diagnosis of SARS-CoV-2 by FDA under an Emergency Use Authorization (EUA). This EUA will remain in effect (meaning this test can be used) for the duration of the COVID-19 declaration under Section 564(b)(1) of the Act, 21 U.S.C. section 360bbb-3(b)(1), unless the authorization is terminated or revoked.  Performed at [facility], [address], [place], [place] [address]      Radiology Studies: US Venous Img Lower Bilateral (DVT)  Result Date: 02/01/2021 CLINICAL DATA:  80 year old female with a history bilateral lower extremity swelling EXAM: BILATERAL LOWER EXTREMITY VENOUS DOPPLER ULTRASOUND TECHNIQUE: Gray-scale sonography with graded compression, as well as color Doppler and duplex ultrasound were performed to evaluate the lower extremity deep venous systems from the level of the common femoral vein and including the common femoral, femoral, profunda femoral, popliteal and calf veins including the posterior tibial, peroneal and gastrocnemius veins when visible. The superficial great saphenous vein was also interrogated. Spectral Doppler was utilized to evaluate flow at rest and with distal augmentation maneuvers in the common femoral, femoral and popliteal veins. COMPARISON:  None. FINDINGS: RIGHT LOWER EXTREMITY Common Femoral Vein: No evidence of thrombus. Normal compressibility, respiratory phasicity and response to augmentation. Saphenofemoral Junction: No  evidence of thrombus. Normal compressibility and flow on color Doppler imaging. Profunda Femoral Vein: No evidence of thrombus. Normal compressibility and flow on color Doppler imaging. Femoral Vein: Occlusive thrombus through the length of the femoral vein no flow and noncompressible. Popliteal Vein: Occlusive thrombus through the popliteal vein, noncompressible with no flow. Calf Veins: No evidence of thrombus. Normal compressibility and flow on color Doppler imaging. Superficial Great Saphenous Vein: No evidence of thrombus. Normal compressibility and flow on color Doppler imaging. Other Findings:  Edema LEFT LOWER EXTREMITY Common Femoral Vein: No evidence of thrombus. Normal compressibility, respiratory phasicity and response to  augmentation. Saphenofemoral Junction: No evidence of thrombus. Normal compressibility and flow on color Doppler imaging. Profunda Femoral Vein: No evidence of thrombus. Normal compressibility and flow on color Doppler imaging. Femoral Vein: No evidence of thrombus. Normal compressibility, respiratory phasicity and response to augmentation. Popliteal Vein: No evidence of thrombus. Normal compressibility, respiratory phasicity and response to augmentation. Calf Veins: No evidence of thrombus. Normal compressibility and flow on color Doppler imaging. Superficial Great Saphenous Vein: No evidence of thrombus. Normal compressibility and flow on color Doppler imaging. Other Findings:  None. IMPRESSION: Sonographic survey of the right lower extremity positive for DVT of the femoral vein and popliteal vein. Sonographic survey of left lower extremity negative DVT Electronically Signed   By: Corrie Mckusick D.O.   On: 02/01/2021 12:44     Scheduled Meds:  vitamin C  500 mg Oral Daily   enoxaparin (LOVENOX) injection  1 mg/kg Subcutaneous Q12H   nirmatrelvir/ritonavir EUA (renal dosing)  2 tablet Oral BID   phosphorus  250 mg Oral TID   zinc sulfate  220 mg Oral Daily   Continuous  Infusions:  sodium chloride     cefTRIAXone (ROCEPHIN)  IV 1 g (02/02/21 1357)     LOS: 0 days    Roxan Hockey, MD [facility]   To contact the attending provider between 7A-7P or the covering provider during after hours 7P-7A, please log into the web site [email or web address] and access using universal [place] password for that web site. If you do not have the password, please call the hospital operator.  02/02/2021, 2:42 PM

## 2021-02-03 DIAGNOSIS — R531 Weakness: Secondary | ICD-10-CM | POA: Diagnosis not present

## 2021-02-03 LAB — COMPREHENSIVE METABOLIC PANEL
ALT: 39 U/L (ref 0–44)
AST: 49 U/L — ABNORMAL HIGH (ref 15–41)
Albumin: 2.6 g/dL — ABNORMAL LOW (ref 3.5–5.0)
Alkaline Phosphatase: 86 U/L (ref 38–126)
Anion gap: 2 — ABNORMAL LOW (ref 5–15)
BUN: 11 mg/dL (ref 8–23)
CO2: 25 mmol/L (ref 22–32)
Calcium: 8 mg/dL — ABNORMAL LOW (ref 8.9–10.3)
Chloride: 110 mmol/L (ref 98–111)
Creatinine, Ser: 0.57 mg/dL (ref 0.44–1.00)
GFR, Estimated: >60 mL/min (ref >60)
Glucose, Bld: 89 mg/dL (ref 70–99)
Potassium: 3.4 mmol/L — ABNORMAL LOW (ref 3.5–5.1)
Sodium: 137 mmol/L (ref 135–145)
Total Bilirubin: 0.3 mg/dL (ref 0.3–1.2)
Total Protein: 5.3 g/dL — ABNORMAL LOW (ref 6.5–8.1)

## 2021-02-03 LAB — CBC WITH DIFFERENTIAL/PLATELET
Abs Immature Granulocytes: 0.09 10*3/uL — ABNORMAL HIGH (ref 0.00–0.07)
Basophils Absolute: 0 10*3/uL (ref 0.0–0.1)
Basophils Relative: 1 %
Eosinophils Absolute: 0.1 10*3/uL (ref 0.0–0.5)
Eosinophils Relative: 3 %
HCT: 31.4 % — ABNORMAL LOW (ref 36.0–46.0)
Hemoglobin: 10.3 g/dL — ABNORMAL LOW (ref 12.0–15.0)
Immature Granulocytes: 2 %
Lymphocytes Relative: 21 %
Lymphs Abs: 1 10*3/uL (ref 0.7–4.0)
MCH: 32.7 pg (ref 26.0–34.0)
MCHC: 32.8 g/dL (ref 30.0–36.0)
MCV: 99.7 fL (ref 80.0–100.0)
Monocytes Absolute: 0.5 10*3/uL (ref 0.1–1.0)
Monocytes Relative: 11 %
Neutro Abs: 2.8 10*3/uL (ref 1.7–7.7)
Neutrophils Relative %: 62 %
Platelets: 110 10*3/uL — ABNORMAL LOW (ref 150–400)
RBC: 3.15 MIL/uL — ABNORMAL LOW (ref 3.87–5.11)
RDW: 13.8 % (ref 11.5–15.5)
WBC: 4.5 10*3/uL (ref 4.0–10.5)
nRBC: 0 % (ref 0.0–0.2)

## 2021-02-03 LAB — FERRITIN: Ferritin: 232 ng/mL (ref 11–307)

## 2021-02-03 LAB — D-DIMER, QUANTITATIVE: D-Dimer, Quant: 6.71 ug/mL-FEU — ABNORMAL HIGH (ref 0.00–0.50)

## 2021-02-03 LAB — RENAL FUNCTION PANEL
Albumin: 2.5 g/dL — ABNORMAL LOW (ref 3.5–5.0)
Anion gap: 4 — ABNORMAL LOW (ref 5–15)
BUN: 12 mg/dL (ref 8–23)
CO2: 25 mmol/L (ref 22–32)
Calcium: 8.1 mg/dL — ABNORMAL LOW (ref 8.9–10.3)
Chloride: 110 mmol/L (ref 98–111)
Creatinine, Ser: 0.59 mg/dL (ref 0.44–1.00)
GFR, Estimated: >60 mL/min (ref >60)
Glucose, Bld: 89 mg/dL (ref 70–99)
Phosphorus: 3 mg/dL (ref 2.5–4.6)
Potassium: 3.4 mmol/L — ABNORMAL LOW (ref 3.5–5.1)
Sodium: 139 mmol/L (ref 135–145)

## 2021-02-03 LAB — C-REACTIVE PROTEIN: CRP: 5.8 mg/dL — ABNORMAL HIGH (ref <1.0)

## 2021-02-03 LAB — URINE CULTURE: Culture: >=100000 — AB

## 2021-02-03 LAB — PHOSPHORUS: Phosphorus: 3 mg/dL (ref 2.5–4.6)

## 2021-02-03 LAB — MAGNESIUM: Magnesium: 1.8 mg/dL (ref 1.7–2.4)

## 2021-02-03 MED ORDER — SODIUM CHLORIDE 0.9 % IV SOLN
100.0000 mg | INTRAVENOUS | Status: AC
  Administered 2021-02-03: 100 mg via INTRAVENOUS
  Filled 2021-02-03: qty 20

## 2021-02-03 MED ORDER — POTASSIUM CHLORIDE CRYS ER 20 MEQ PO TBCR
40.0000 meq | EXTENDED_RELEASE_TABLET | ORAL | Status: AC
  Administered 2021-02-03 (×2): 40 meq via ORAL
  Filled 2021-02-03 (×2): qty 2

## 2021-02-03 MED ORDER — PREDNISONE 20 MG PO TABS: 40.0000 mg | ORAL_TABLET | Freq: Every day | ORAL | 0 refills | Status: AC

## 2021-02-03 MED ORDER — ELIQUIS DVT/PE STARTER PACK 5 MG PO TBPK: ORAL_TABLET | ORAL | 0 refills | Status: DC

## 2021-02-03 MED ORDER — APIXABAN 5 MG PO TABS
10.0000 mg | ORAL_TABLET | Freq: Two times a day (BID) | ORAL | Status: DC
  Administered 2021-02-03: 10 mg via ORAL
  Filled 2021-02-03: qty 2

## 2021-02-03 MED ORDER — SODIUM CHLORIDE 0.9 % IV SOLN: 100.0000 mg | Freq: Every day | INTRAVENOUS | Status: DC

## 2021-02-03 MED ORDER — APIXABAN 5 MG PO TABS: 5.0000 mg | ORAL_TABLET | Freq: Two times a day (BID) | ORAL | Status: DC

## 2021-02-03 MED ORDER — PANTOPRAZOLE SODIUM 40 MG PO TBEC: 40.0000 mg | DELAYED_RELEASE_TABLET | Freq: Every day | ORAL | 5 refills | Status: DC

## 2021-02-03 MED ORDER — SODIUM CHLORIDE 0.9 % IV SOLN
1.0000 g | Freq: Once | INTRAVENOUS | Status: AC
  Administered 2021-02-03: 1 g via INTRAVENOUS
  Filled 2021-02-03: qty 10

## 2021-02-03 MED ORDER — ZINC SULFATE 220 (50 ZN) MG PO CAPS: 220.0000 mg | ORAL_CAPSULE | Freq: Every day | ORAL | 5 refills | Status: DC

## 2021-02-03 MED ORDER — APIXABAN 5 MG PO TABS: 5.0000 mg | ORAL_TABLET | Freq: Two times a day (BID) | ORAL | 5 refills | Status: DC

## 2021-02-03 MED ORDER — AMLODIPINE BESYLATE 2.5 MG PO TABS: 2.5000 mg | ORAL_TABLET | Freq: Every day | ORAL | 11 refills | Status: DC

## 2021-02-03 MED ORDER — ASCORBIC ACID 500 MG PO TABS: 500.0000 mg | ORAL_TABLET | Freq: Every day | ORAL | 1 refills | Status: DC

## 2021-02-03 NOTE — Progress Notes (Signed)
ANTICOAGULATION CONSULT NOTE - Initial Consult  Pharmacy Consult for Eliquis Indication: DVT  Allergies  Allergen Reactions   Amoxicillin Other (See Comments)    Unknown reaction   Codeine     Unknown reaction   Penicillins     Long time ago . But no anaphylaxis    Patient Measurements: Height: '5\' 6"'$  (167.6 cm) Weight: 70 kg (154 lb 5.2 oz) IBW/kg (Calculated) : 59.3  Vital Signs: Temp: 98.1 F (36.7 C) (08/11 0527) BP: 163/81 (08/11 0527) Pulse Rate: 84 (08/11 0527)  Labs: Recent Labs    01/31/21 1043 02/01/21 0609 02/02/21 0457 02/03/21 0542  HGB 11.9* 9.9* 9.9* 10.3*  HCT 36.7 31.2* 30.6* 31.4*  PLT 85* 72* 82* 110*  CREATININE 0.70 0.60 0.56 0.59  0.57  CKTOTAL 46  --   --   --     Estimated Creatinine Clearance (by C-G formula based on SCr of 0.57 mg/dL) Female: 51.6 mL/min Female: 65.4 mL/min   Medical History: Past Medical History:  Diagnosis Date   Acquired absence of unspecified breast and nipple    Age-related osteoporosis without current pathological fracture    Age-related osteoporosis without current pathological fracture    Anxiety    Anxiety disorder, unspecified    Cancer ([place]) 1994   BREAST CANCER, TREATED WITH RADIATION AND CHEMOTHERAPY   Depression with anxiety 03/01/2012   Essential (primary) hypertension    Fatty (change of) liver, not elsewhere classified    Fatty liver    H/O vitamin D deficiency    Hyperlipidemia 03/01/2012   Hypertension    Insomnia, unspecified    Localized edema    Lumbago    Major depressive disorder, single episode, unspecified    Malignant neoplasm of unspecified site of unspecified female breast (HCC)    Osteoporosis    Other abnormalities of gait and mobility    Polyosteoarthritis, unspecified    Radiculopathy, site unspecified    Rosacea    Syncope and collapse    Tremor, unspecified    Unspecified convulsions (HCC)    Unspecified fall, initial encounter     Medications:  Medications Prior to  Admission  Medication Sig Dispense Refill Last Dose   acetaminophen (TYLENOL) 325 MG tablet Take 650 mg by mouth every 6 (six) hours as needed for headache.   Past Week    Assessment: Patient with right LE DVT --- patient is probably hypercoagulable in the setting of COVID infection.  -Venous Dopplers consistent with right femoral and popliteal vein DVT. Now transitioning lovenox to Eliquis for discharge  Goal of Therapy:  Monitor platelets by anticoagulation protocol: Yes   Plan:  Eliquis '10mg'$  po bid x 7 days, then '5mg'$  po bid Educate on eliquis Monitor V/S, labs  Isac Sarna, BS Pharm D, BCPS Clinical Pharmacist Pager [phone] 02/03/2021,8:56 AM

## 2021-02-03 NOTE — Evaluation (Signed)
Physical Therapy Evaluation Patient Details Name: Tammy Terry MRN: [id number] DOB: [birth year] Today's Date: 02/03/2021   History of Present Illness  Tammy Terry is a 80 y.o. who is covid19 positive and has R femoral and popliteal DVT. PMH: stage III breast cancer, depression, anxiety, fatty liver, hypertension.  Clinical Impression  Pt admitted with above diagnosis. Pt reports using SPC in the home, using standard walker for limited distances outside but reports not very active during the day. Pt reports her spouse completes household chores and drives, no other family presently available to assist. Pt currently requiring mod A to get OOB, reports sleeps in recliner at baseline so not used to that motion. Pt requires min A with transfers and ambulation using RW and total A from nurse tech for pericare. Recommending SNF due to below baseline and spouse at home sick with covid possibly unable to assist pt at this level. Pt currently with functional limitations due to the deficits listed below (see PT Problem List). Pt will benefit from skilled PT to increase their independence and safety with mobility to allow discharge to the venue listed below.       Follow Up Recommendations SNF    Equipment Recommendations  Rolling walker with 5" wheels    Recommendations for Other Services       Precautions / Restrictions Precautions Precautions: Fall Precaution Comments: RLE DVT Restrictions Weight Bearing Restrictions: No      Mobility  Bed Mobility Overal bed mobility: Needs Assistance Bed Mobility: Supine to Sit  Supine to sit: Mod assist;HOB elevated  General bed mobility comments: pt able to mobilize LE over to EOB, mod A to upright trunk into sitting and scoot out to EOB    Transfers Overall transfer level: Needs assistance Equipment used: Rolling walker (2 wheeled) Transfers: Sit to/from [facility] Sit to Stand: Min assist Stand pivot transfers: Min assist   General transfer comment: VCs for hand placement to power to stand, min A to power up and to steady to recliner, completed 5 STS requiring reminder of hand placement each time  Ambulation/Gait Ambulation/Gait assistance: Min assist   Assistive device: Rolling walker (2 wheeled) Gait Pattern/deviations: Step-to pattern;Decreased stride length Gait velocity: decreased   General Gait Details: slow, step to pattern with RW over to recliner, denies dizziness but reports fatigue, no SOB noted  Stairs            Wheelchair Mobility    Modified Rankin (Stroke Patients Only)       Balance Overall balance assessment: Needs assistance Sitting-balance support: Feet supported Sitting balance-Leahy Scale: Good Sitting balance - Comments: seated EOB   Standing balance support: During functional activity;Bilateral upper extremity supported Standing balance-Leahy Scale: Poor Standing balance comment: reliant on UE support       Pertinent Vitals/Pain Pain Assessment: No/denies pain    Home Living Family/patient expects to be discharged to:: Private residence Living Arrangements: Spouse/significant other Available Help at Discharge: Family Type of Home: House Home Access: Ramped entrance     Home Layout: One level Home Equipment: Walker - standard;Cane - single point      Prior Function Level of Independence: Independent with assistive device(s);Needs assistance   Gait / Transfers Assistance Needed: Pt ambulates household distances with SPC, uses standard walker for limited community distances, sleeps in recliner. Pt denies falls.  ADL's / Homemaking Assistance Needed: Pt reports spouse completes household chores; pt independent with sponge bath, dressing and toileting  Comments: Pt reports spouse drives.  Hand Dominance        Extremity/Trunk Assessment   Upper Extremity Assessment Upper Extremity Assessment: Overall WFL for tasks assessed    Lower Extremity  Assessment Lower Extremity Assessment: Generalized weakness (AROM WNL, strength grossly 3+/5)    Cervical / Trunk Assessment Cervical / Trunk Assessment: Kyphotic  Communication   Communication: No difficulties  Cognition Arousal/Alertness: Awake/alert Behavior During Therapy: WFL for tasks assessed/performed Overall Cognitive Status: No family/caregiver present to determine baseline cognitive functioning  General Comments: Pt able to answer a&o questions, looks to whiteboard for date cues, states has short term memory problems sometimes.      General Comments      Exercises     Assessment/Plan    PT Assessment Patient needs continued PT services  PT Problem List Decreased strength;Decreased activity tolerance;Decreased balance;Decreased mobility;Decreased knowledge of use of DME;Cardiopulmonary status limiting activity       PT Treatment Interventions DME instruction;Gait training;Functional mobility training;Therapeutic activities;Therapeutic exercise;Balance training;Patient/family education    PT Goals (Current goals can be found in the Care Plan section)  Acute Rehab PT Goals Patient Stated Goal: agreeable to therapy PT Goal Formulation: With patient Time For Goal Achievement: 02/17/21 Potential to Achieve Goals: Good    Frequency Min 3X/week   Barriers to discharge        Co-evaluation               AM-PAC PT "6 Clicks" Mobility  Outcome Measure Help needed turning from your back to your side while in a flat bed without using bedrails?: A Little Help needed moving from lying on your back to sitting on the side of a flat bed without using bedrails?: A Little Help needed moving to and from a bed to a chair (including a wheelchair)?: A Little Help needed standing up from a chair using your arms (e.g., wheelchair or bedside chair)?: A Little Help needed to walk in hospital room?: A Little Help needed climbing 3-5 steps with a railing? : A Lot 6 Click Score:  17    End of Session Equipment Utilized During Treatment: Gait belt Activity Tolerance: Patient tolerated treatment well Patient left: in chair;with call bell/phone within reach Nurse Communication: Mobility status;Other (comment) (BM) PT Visit Diagnosis: Unsteadiness on feet (R26.81);Other abnormalities of gait and mobility (R26.89);Muscle weakness (generalized) (M62.81)    Time: [id number] PT Time Calculation (min) (ACUTE ONLY): 32 min   Charges:   PT Evaluation $PT Eval Low Complexity: 1 Low PT Treatments $Therapeutic Activity: 8-22 mins         Tori Jamariyah Johannsen PT, DPT 02/03/21, 11:48 AM

## 2021-02-03 NOTE — Clinical Social Work Note (Signed)
PT recommended HHPT. Discussed choices. Referral made and accepted by [facility] with Alvis Lemmings.  Sister and BIL, Jenny Reichmann and [employer], indicated that they would like for patient to go to SNF for rehab. Obtained permission from patient to speak with them and add them to chart. Advised that patient had made the choice to discharge home with [facility] and not go to SNF. Discussed that once patient discharged, she would have to have her PCP place her.     Emmett Arntz, Clydene Pugh, LCSW

## 2021-02-03 NOTE — Discharge Instructions (Signed)
1)Take Eliquis/Apixaban which is your blood thinner as prescribed to treat the blood clot in your right leg until prevent further blood clots  2)Avoid ibuprofen/Advil/Aleve/Motrin/Goody Powders/Naproxen/BC powders/Meloxicam/Diclofenac/Indomethacin and other Nonsteroidal anti-inflammatory medications as these will make you more likely to bleed and can cause stomach ulcers, can also cause Kidney problems.   3)You are strongly advised to isolate/quarantine for at least 10 days from the date of your diagnosis with COVID-19 infection--please always wear a mask if you have to go outside the house  4)You advised to get [place] or [place]  Covid Vaccine in about  3 month  from now to reduce your chance of getting severe Covid 19 reinfection   5)Please take medications as prescribed  6)Video/Virtual follow-up visit with primary care physician in about a week advised  6)Repeat CBC and BMP blood test with primary care physician in a couple weeks

## 2021-02-03 NOTE — Plan of Care (Signed)
  Problem: Acute Rehab PT Goals(only PT should resolve) Goal: Pt Will Go Supine/Side To Sit Outcome: Progressing Flowsheets (Taken 02/03/2021 1154) Pt will go Supine/Side to Sit: with minimal assist Goal: Patient Will Transfer Sit To/From Stand Outcome: Progressing Flowsheets (Taken 02/03/2021 1154) Patient will transfer sit to/from stand: with min guard assist Goal: Pt Will Transfer Bed To Chair/Chair To Bed Outcome: Progressing Flowsheets (Taken 02/03/2021 1154) Pt will Transfer Bed to Chair/Chair to Bed: min guard assist Goal: Pt Will Ambulate Outcome: Progressing Flowsheets (Taken 02/03/2021 1154) Pt will Ambulate:  50 feet  with minimal assist  with rolling walker Goal: Pt/caregiver will Perform Home Exercise Program Outcome: Progressing Flowsheets (Taken 02/03/2021 1154) Pt/caregiver will Perform Home Exercise Program:  For increased ROM  For increased strengthening  For improved balance  With Supervision, verbal cues required/provided   Talbot Grumbling PT, DPT 02/03/21, 11:56 AM

## 2021-02-03 NOTE — Discharge Summary (Signed)
Tammy Terry, is a 80 y.o. adult  DOB [birth year]  MRN [id number].  Admission date:  01/31/2021  Admitting Physician  Bethena Roys, MD  Discharge Date:  02/03/2021   Primary MD  Celene Squibb, MD  Recommendations for primary care physician for things to follow:   1)Take Eliquis/Apixaban which is your blood thinner as prescribed to treat the blood clot in your right leg until prevent further blood clots  2)Avoid ibuprofen/Advil/Aleve/Motrin/Goody Powders/Naproxen/BC powders/Meloxicam/Diclofenac/Indomethacin and other Nonsteroidal anti-inflammatory medications as these will make you more likely to bleed and can cause stomach ulcers, can also cause Kidney problems.   3)You are strongly advised to isolate/quarantine for at least 10 days from the date of your diagnosis with COVID-19 infection--please always wear a mask if you have to go outside the house  4)You advised to get [place] or [place]  Covid Vaccine in about  3 month  from now to reduce your chance of getting severe Covid 19 reinfection   5)Please take medications as prescribed  6)Video/Virtual follow-up visit with primary care physician in about a week advised  6)Repeat CBC and BMP blood test with primary care physician in a couple weeks  Admission Diagnosis  Leg swelling [M79.89] Acute cystitis with hematuria [N30.01] Generalized weakness [R53.1] Acute pain of left knee [M25.562] COVID-19 [U07.1]   Discharge Diagnosis  Leg swelling [M79.89] Acute cystitis with hematuria [N30.01] Generalized weakness [R53.1] Acute pain of left knee [M25.562] COVID-19 [U07.1]    Principal Problem:   Generalized weakness Active Problems:   Rt Leg DVT (deep venous thrombosis)/Femoral Vein and Popliteal Vein   Essential hypertension, benign   COVID-19 virus infection   Acute lower UTI   Pressure injury of skin      Past Medical History:  Diagnosis  Date   Acquired absence of unspecified breast and nipple    Age-related osteoporosis without current pathological fracture    Age-related osteoporosis without current pathological fracture    Anxiety    Anxiety disorder, unspecified    Cancer ([place]) 1994   BREAST CANCER, TREATED WITH RADIATION AND CHEMOTHERAPY   Depression with anxiety 03/01/2012   Essential (primary) hypertension    Fatty (change of) liver, not elsewhere classified    Fatty liver    H/O vitamin D deficiency    Hyperlipidemia 03/01/2012   Hypertension    Insomnia, unspecified    Localized edema    Lumbago    Major depressive disorder, single episode, unspecified    Malignant neoplasm of unspecified site of unspecified female breast ([place])    Osteoporosis    Other abnormalities of gait and mobility    Polyosteoarthritis, unspecified    Radiculopathy, site unspecified    Rosacea    Syncope and collapse    Tremor, unspecified    Unspecified convulsions ([place])    Unspecified fall, initial encounter     Past Surgical History:  Procedure Laterality Date   BREAST SURGERY  1994   LEFT MASTECTOMY    CAPSULOTOMY  august 2015   of left  eye   CATARACT EXTRACTION Left    CESAREAN SECTION     COLONOSCOPY N/A 05/13/2014   Procedure: COLONOSCOPY;  Surgeon: Rogene Houston, MD;  Location: [facility];  Service: Endoscopy;  Laterality: N/A;  930   MASTECTOMY     left breast for cancer in [place]       HPI  from the history and physical done on the day of admission:     Chief Complaint: Weakness   HPI: Tammy Terry is a 80 y.o. adult with medical history significant for stage III breast cancer, depression and anxiety, fatty liver, hypertension. Patient was brought to the ED via EMS reports that patient has been sitting in a chair for 5 days.  Patient is awake alert and oriented and able to answer questions appropriately.  Patient reports she was not able to get out of the chair without her assistance.  She  has been weak all over.  Denies body aches.  No cough, no difficulty breathing.  No vomiting, no loose stools.  She has received at least 2 doses of the COVID-vaccine, she is unsure if she got a booster also.   ED Course: Stable vitals.  O2 sats greater than 97% on room air.  ED patient was unable to sit up or even roll herself.  Spouse reported he is unable to care for patient.  Patient was also soiled with urine.  CK 46.  UA suggestive of UTI.  EKG without significant change.  COVID test positive.  Cr at baseline 0.7.  Chest Xray- Without acute abnormality.  Left knee x-rays also done due to complaints of knee pain, without acute fracture or dislocation and showed trace pleural knee effusion.  Bactrim initially given with plans for discharge, both later started on IV ciprofloxacin .   Review of Systems: As per HPI all other systems reviewed and negative.      Hospital Course:   Assessment & Plan:    1)Rt LE DVT --- patient is probably hypercoagulable in the setting of COVID infection -Venous Dopplers consistent with right femoral and popliteal vein DVT -Treated with Therapeutic Lovenox - Ok to dc home with  Eliquis  -Denies chest pains or increased shortness of breath or hypoxia at this time   2) E. coli UTI- Pan-sensitive --- Treated with IV Rocephin x 3 days   3) COVID-19 infection--- Treated Paxlovid -Chest x-ray without pneumonia, -No hypoxia -Continue zinc and vitamin C   4)Generalized Weakness/Physical Deconditioning --- multifactorial in the setting of UTI COVID infection and DVT Physical therapy eval appreciated---Phy therapy recommnends SNF,  -Pt and Husband declined SNF, they prefer to go home with Home Health --Patient lives with her elderly husband at home    5)HTN--okay to start amlodipine 2.5 mg daily for better BP control -  6)HypoPhostamia--- Phosphorus is 3.0 from 2.0, after replacement-    Code Status: Full code Family Communication:   Case discussed in details  with patient and spouse Disposition:    Dispo: The patient is from: Home              Anticipated d/c is to: home              Consultants:  None    Procedures: See below for x-ray reports   Antimicrobials/antiviral:  Rocephin Paxlovid   Discharge Condition: stable without hypoxia  Follow UP   Follow-up Information     Celene Squibb, MD Follow up in 2 week(s).  Specialty: Internal Medicine Why: Repeat CBC and BMP test Contact information: [place] [address] [phone]                 Diet and Activity recommendation:  As advised  Discharge Instructions    Discharge Instructions     Call MD for:  difficulty breathing, headache or visual disturbances   Complete by: As directed    Call MD for:  persistant dizziness or light-headedness   Complete by: As directed    Call MD for:  persistant nausea and vomiting   Complete by: As directed    Call MD for:  severe uncontrolled pain   Complete by: As directed    Call MD for:  temperature >100.4   Complete by: As directed    Diet - low sodium heart healthy   Complete by: As directed    Discharge instructions   Complete by: As directed    1)Take Eliquis/Apixaban which is your blood thinner as prescribed to treat the blood clot in your right leg until prevent further blood clots  2)Avoid ibuprofen/Advil/Aleve/Motrin/Goody Powders/Naproxen/BC powders/Meloxicam/Diclofenac/Indomethacin and other Nonsteroidal anti-inflammatory medications as these will make you more likely to bleed and can cause stomach ulcers, can also cause Kidney problems.   3)You are strongly advised to isolate/quarantine for at least 10 days from the date of your diagnosis with COVID-19 infection--please always wear a mask if you have to go outside the house  4)You advised to get [place] or [place]  Covid Vaccine in about  3 month  from now to reduce your chance of getting severe Covid 19 reinfection   5)Please take  medications as prescribed  6)Video/Virtual follow-up visit with primary care physician in about a week advised  6)Repeat CBC and BMP blood test with primary care physician in a couple weeks   Discharge wound care:   Complete by: As directed    Keep wound clean and dry   Increase activity slowly   Complete by: As directed          Discharge Medications     Allergies as of 02/03/2021       Reactions   Amoxicillin Other (See Comments)   Unknown reaction   Codeine    Unknown reaction   Penicillins    Long time ago . But no anaphylaxis        Medication List     TAKE these medications    acetaminophen 325 MG tablet Commonly known as: TYLENOL Take 650 mg by mouth every 6 (six) hours as needed for headache.   amLODipine 2.5 MG tablet Commonly known as: NORVASC Take 1 tablet (2.5 mg total) by mouth daily. FOR BP   ascorbic acid 500 MG tablet Commonly known as: VITAMIN C Take 1 tablet (500 mg total) by mouth daily. Start taking on: February 04, 2021   Eliquis DVT/PE Starter Pack Generic drug: Apixaban Starter Pack ('10mg'$  and '5mg'$ ) Take as directed on package: start with two-'5mg'$  tablets twice daily for 7 days. On day 8, switch to one-'5mg'$  tablet twice daily.   apixaban 5 MG Tabs tablet Commonly known as: ELIQUIS Take 1 tablet (5 mg total) by mouth 2 (two) times daily. Start taking on: March 03, 2021   pantoprazole 40 MG tablet Commonly known as: Protonix Take 1 tablet (40 mg total) by mouth daily.   predniSONE 20 MG tablet Commonly known as: DELTASONE Take 2 tablets (40 mg total) by mouth daily with breakfast for 5 days.  zinc sulfate 220 (50 Zn) MG capsule Take 1 capsule (220 mg total) by mouth daily. Start taking on: February 04, 2021               Discharge Care Instructions  (From admission, onward)           Start     Ordered   02/03/21 0000  Discharge wound care:       Comments: Keep wound clean and dry   02/03/21 1426             Major procedures and Radiology Reports - PLEASE review detailed and final reports for all details, in brief -   DG Chest 2 View  Result Date: 01/31/2021 CLINICAL DATA:  80 year old female with history of weakness. EXAM: CHEST - 2 VIEW COMPARISON:  Chest x-ray 04/03/2018. FINDINGS: Lung volumes are normal. No consolidative airspace disease. No pleural effusions. No pneumothorax. No pulmonary nodule or mass noted. Pulmonary vasculature and the cardiomediastinal silhouette are within normal limits. Atherosclerosis in the thoracic aorta. Surgical clips in the left axillary region, presumably from prior lymph node dissection. IMPRESSION: 1.  No radiographic evidence of acute cardiopulmonary disease. 2. Aortic atherosclerosis. Electronically Signed   By: Vinnie Langton M.D.   On: 01/31/2021 14:14   US Venous Img Lower Bilateral (DVT)  Result Date: 02/01/2021 CLINICAL DATA:  80 year old female with a history bilateral lower extremity swelling EXAM: BILATERAL LOWER EXTREMITY VENOUS DOPPLER ULTRASOUND TECHNIQUE: Gray-scale sonography with graded compression, as well as color Doppler and duplex ultrasound were performed to evaluate the lower extremity deep venous systems from the level of the common femoral vein and including the common femoral, femoral, profunda femoral, popliteal and calf veins including the posterior tibial, peroneal and gastrocnemius veins when visible. The superficial great saphenous vein was also interrogated. Spectral Doppler was utilized to evaluate flow at rest and with distal augmentation maneuvers in the common femoral, femoral and popliteal veins. COMPARISON:  None. FINDINGS: RIGHT LOWER EXTREMITY Common Femoral Vein: No evidence of thrombus. Normal compressibility, respiratory phasicity and response to augmentation. Saphenofemoral Junction: No evidence of thrombus. Normal compressibility and flow on color Doppler imaging. Profunda Femoral Vein: No evidence of thrombus. Normal  compressibility and flow on color Doppler imaging. Femoral Vein: Occlusive thrombus through the length of the femoral vein no flow and noncompressible. Popliteal Vein: Occlusive thrombus through the popliteal vein, noncompressible with no flow. Calf Veins: No evidence of thrombus. Normal compressibility and flow on color Doppler imaging. Superficial Great Saphenous Vein: No evidence of thrombus. Normal compressibility and flow on color Doppler imaging. Other Findings:  Edema LEFT LOWER EXTREMITY Common Femoral Vein: No evidence of thrombus. Normal compressibility, respiratory phasicity and response to augmentation. Saphenofemoral Junction: No evidence of thrombus. Normal compressibility and flow on color Doppler imaging. Profunda Femoral Vein: No evidence of thrombus. Normal compressibility and flow on color Doppler imaging. Femoral Vein: No evidence of thrombus. Normal compressibility, respiratory phasicity and response to augmentation. Popliteal Vein: No evidence of thrombus. Normal compressibility, respiratory phasicity and response to augmentation. Calf Veins: No evidence of thrombus. Normal compressibility and flow on color Doppler imaging. Superficial Great Saphenous Vein: No evidence of thrombus. Normal compressibility and flow on color Doppler imaging. Other Findings:  None. IMPRESSION: Sonographic survey of the right lower extremity positive for DVT of the femoral vein and popliteal vein. Sonographic survey of left lower extremity negative DVT Electronically Signed   By: Corrie Mckusick D.O.   On: 02/01/2021 12:44   DG Knee Complete 4  Views Left  Result Date: 01/31/2021 CLINICAL DATA:  Five days of knee pain EXAM: LEFT KNEE - COMPLETE 4+ VIEW COMPARISON:  Left knee radiographs 02/28/2018 FINDINGS: There is no acute fracture or dislocation. Knee alignment is normal. There is a trace knee effusion. There is diffuse soft tissue edema. There is no soft tissue gas. IMPRESSION: No acute fracture or dislocation.   Trace knee effusion. Diffuse soft tissue edema.  No soft tissue gas. Electronically Signed   By: Valetta Mole MD   On: 01/31/2021 14:18    Micro Results  Recent Results (from the past 240 hour(s))  Urine Culture     Status: Abnormal   Collection Time: 01/31/21 11:08 AM   Specimen: In/Out Cath Urine  Result Value Ref Range Status   Specimen Description   Final    IN/OUT CATH URINE Performed at [facility], [address], [place], [place] [address]    Special Requests   Final    NONE Performed at [facility], [place] [address], [place], [place] [address]    Culture >=100,000 COLONIES/mL ESCHERICHIA COLI (A)  Final   Report Status 02/03/2021 FINAL  Final   Organism ID, Bacteria ESCHERICHIA COLI (A)  Final      Susceptibility   Escherichia coli - MIC*    AMPICILLIN <=2 SENSITIVE Sensitive     CEFAZOLIN <=4 SENSITIVE Sensitive     CEFEPIME <=0.12 SENSITIVE Sensitive     CEFTRIAXONE <=0.25 SENSITIVE Sensitive     CIPROFLOXACIN <=0.25 SENSITIVE Sensitive     GENTAMICIN <=1 SENSITIVE Sensitive     IMIPENEM <=0.25 SENSITIVE Sensitive     NITROFURANTOIN <=16 SENSITIVE Sensitive     TRIMETH/SULFA <=20 SENSITIVE Sensitive     AMPICILLIN/SULBACTAM <=2 SENSITIVE Sensitive     PIP/TAZO <=4 SENSITIVE Sensitive     * >=100,000 COLONIES/mL ESCHERICHIA COLI  Resp Panel by RT-PCR (Flu A&B, Covid) Nasopharyngeal Swab     Status: Abnormal   Collection Time: 01/31/21 12:45 PM   Specimen: Nasopharyngeal Swab; Nasopharyngeal(NP) swabs in vial transport medium  Result Value Ref Range Status   SARS Coronavirus 2 by RT PCR POSITIVE (A) NEGATIVE Final    Comment: CRITICAL RESULT CALLED TO, READ BACK BY AND VERIFIED WITH: COE,A AT 1520 ON 8.8.22 BY RUCINSKI,B (NOTE) SARS-CoV-2 target nucleic acids are DETECTED.  The SARS-CoV-2 RNA is generally detectable in upper respiratory specimens during the acute phase of infection. Positive results are indicative of the presence of the identified virus, but  do not rule out bacterial infection or co-infection with other pathogens not detected by the test. Clinical correlation with patient history and other diagnostic information is necessary to determine patient infection status. The expected result is Negative.  Fact Sheet for Patients: EntrepreneurPulse.com.au  Fact Sheet for Healthcare Providers: IncredibleEmployment.be  This test is not yet approved or cleared by the [place] FDA and  has been authorized for detection and/or diagnosis of SARS-CoV-2 by FDA under an Emergency Use Authorization (EUA).  This EUA will remain in effect (meaning thi s test can be used) for the duration of  the COVID-19 declaration under Section 564(b)(1) of the Act, 21 U.S.C. section 360bbb-3(b)(1), unless the authorization is terminated or revoked sooner.     Influenza A by PCR NEGATIVE NEGATIVE Final   Influenza B by PCR NEGATIVE NEGATIVE Final    Comment: (NOTE) The Xpert Xpress SARS-CoV-2/FLU/RSV plus assay is intended as an aid in the diagnosis of influenza from Nasopharyngeal swab specimens and should not be  used as a sole basis for treatment. Nasal washings and aspirates are unacceptable for Xpert Xpress SARS-CoV-2/FLU/RSV testing.  Fact Sheet for Patients: EntrepreneurPulse.com.au  Fact Sheet for Healthcare Providers: IncredibleEmployment.be  This test is not yet approved or cleared by the [place] FDA and has been authorized for detection and/or diagnosis of SARS-CoV-2 by FDA under an Emergency Use Authorization (EUA). This EUA will remain in effect (meaning this test can be used) for the duration of the COVID-19 declaration under Section 564(b)(1) of the Act, 21 U.S.C. section 360bbb-3(b)(1), unless the authorization is terminated or revoked.  Performed at [facility], [address], [place], [place] [address]        Today   Subjective   Gissella Coverdale today has no new complaints -No hypoxia, no significant cough No fever  Or chills   No Nausea, Vomiting or Diarrhea          Patient has been seen and examined prior to discharge   Objective   Blood pressure (!) 163/81, pulse 84, temperature 98.1 F (36.7 C), resp. rate 18, height '5\' 6"'$  (1.676 m), weight 70 kg, SpO2 99 %.   Intake/Output Summary (Last 24 hours) at 02/03/2021 1658 Last data filed at 02/03/2021 1500 Gross per 24 hour  Intake 720 ml  Output 950 ml  Net -230 ml    Exam Gen:- Awake Alert, no acute distress  HEENT:- [place].AT, No sclera icterus Neck-Supple Neck,No JVD,.  Lungs-  CTAB , good air movement bilaterally  CV- S1, S2 normal, regular Abd-  +ve B.Sounds, Abd Soft, No tenderness,    Extremity--  good pulses,-Bilateral lower extremity swelling and tenderness right more than left Psych-affect is appropriate, oriented x3 Neuro-generalized weakness without new focal deficits , no tremors  Skin-  patient with stage II pressure injury appreciated in her right buttocks and also in her right hip area; no signs of superimposed infection.  Injuries were present at time of admission.   Data Review   CBC w Diff:  Lab Results  Component Value Date   WBC 4.5 02/03/2021   HGB 10.3 (L) 02/03/2021   HGB 12.8 02/23/2014   HCT 31.4 (L) 02/03/2021   HCT 39.9 02/23/2014   PLT 110 (L) 02/03/2021   PLT 145 02/23/2014   LYMPHOPCT 21 02/03/2021   LYMPHOPCT 24.9 02/23/2014   MONOPCT 11 02/03/2021   MONOPCT 8.8 02/23/2014   EOSPCT 3 02/03/2021   EOSPCT 1.9 02/23/2014   BASOPCT 1 02/03/2021   BASOPCT 0.5 02/23/2014    CMP:  Lab Results  Component Value Date   NA 137 02/03/2021   NA 139 02/03/2021   NA 144 12/12/2018   NA 144 02/23/2014   K 3.4 (L) 02/03/2021   K 3.4 (L) 02/03/2021   K 4.1 02/23/2014   CL 110 02/03/2021   CL 110 02/03/2021   CL 105 03/01/2012   CO2 25 02/03/2021   CO2 25 02/03/2021   CO2 28 02/23/2014   BUN 11 02/03/2021   BUN 12  02/03/2021   BUN 20 12/12/2018   BUN 24.2 02/23/2014   CREATININE 0.57 02/03/2021   CREATININE 0.59 02/03/2021   CREATININE 0.84 02/03/2015   CREATININE 0.8 02/23/2014   GLU 90 12/12/2018   PROT 5.3 (L) 02/03/2021   PROT 7.1 02/23/2014   ALBUMIN 2.6 (L) 02/03/2021   ALBUMIN 2.5 (L) 02/03/2021   ALBUMIN 4.1 02/23/2014   BILITOT 0.3 02/03/2021   BILITOT 0.43 02/23/2014   ALKPHOS 86 02/03/2021   ALKPHOS 79  02/23/2014   AST 49 (H) 02/03/2021   AST 21 02/23/2014   ALT 39 02/03/2021   ALT 31 02/23/2014  .   Total Discharge time is about 33 minutes  Roxan Hockey M.D on 02/03/2021 at 4:58 PM  Go to [email or web address] -  for contact info  [facility] - Office  [phone]

## 2021-02-05 DIAGNOSIS — N3001 Acute cystitis with hematuria: Secondary | ICD-10-CM | POA: Diagnosis not present

## 2021-02-05 DIAGNOSIS — L89311 Pressure ulcer of right buttock, stage 1: Secondary | ICD-10-CM | POA: Diagnosis not present

## 2021-02-05 DIAGNOSIS — I82411 Acute embolism and thrombosis of right femoral vein: Secondary | ICD-10-CM | POA: Diagnosis not present

## 2021-02-05 DIAGNOSIS — M25462 Effusion, left knee: Secondary | ICD-10-CM | POA: Diagnosis not present

## 2021-02-05 DIAGNOSIS — L89321 Pressure ulcer of left buttock, stage 1: Secondary | ICD-10-CM | POA: Diagnosis not present

## 2021-02-05 DIAGNOSIS — I82431 Acute embolism and thrombosis of right popliteal vein: Secondary | ICD-10-CM | POA: Diagnosis not present

## 2021-02-05 DIAGNOSIS — U071 COVID-19: Secondary | ICD-10-CM | POA: Diagnosis not present

## 2021-02-05 DIAGNOSIS — B9620 Unspecified Escherichia coli [E. coli] as the cause of diseases classified elsewhere: Secondary | ICD-10-CM | POA: Diagnosis not present

## 2021-02-07 DIAGNOSIS — I82431 Acute embolism and thrombosis of right popliteal vein: Secondary | ICD-10-CM | POA: Diagnosis not present

## 2021-02-07 DIAGNOSIS — N3001 Acute cystitis with hematuria: Secondary | ICD-10-CM | POA: Diagnosis not present

## 2021-02-07 DIAGNOSIS — L89321 Pressure ulcer of left buttock, stage 1: Secondary | ICD-10-CM | POA: Diagnosis not present

## 2021-02-07 DIAGNOSIS — U071 COVID-19: Secondary | ICD-10-CM | POA: Diagnosis not present

## 2021-02-07 DIAGNOSIS — M25462 Effusion, left knee: Secondary | ICD-10-CM | POA: Diagnosis not present

## 2021-02-07 DIAGNOSIS — B9620 Unspecified Escherichia coli [E. coli] as the cause of diseases classified elsewhere: Secondary | ICD-10-CM | POA: Diagnosis not present

## 2021-02-07 DIAGNOSIS — I82411 Acute embolism and thrombosis of right femoral vein: Secondary | ICD-10-CM | POA: Diagnosis not present

## 2021-02-07 DIAGNOSIS — L89311 Pressure ulcer of right buttock, stage 1: Secondary | ICD-10-CM | POA: Diagnosis not present

## 2021-02-09 DIAGNOSIS — B9620 Unspecified Escherichia coli [E. coli] as the cause of diseases classified elsewhere: Secondary | ICD-10-CM | POA: Diagnosis not present

## 2021-02-09 DIAGNOSIS — M25462 Effusion, left knee: Secondary | ICD-10-CM | POA: Diagnosis not present

## 2021-02-09 DIAGNOSIS — L89311 Pressure ulcer of right buttock, stage 1: Secondary | ICD-10-CM | POA: Diagnosis not present

## 2021-02-09 DIAGNOSIS — I82411 Acute embolism and thrombosis of right femoral vein: Secondary | ICD-10-CM | POA: Diagnosis not present

## 2021-02-09 DIAGNOSIS — I82431 Acute embolism and thrombosis of right popliteal vein: Secondary | ICD-10-CM | POA: Diagnosis not present

## 2021-02-09 DIAGNOSIS — N3001 Acute cystitis with hematuria: Secondary | ICD-10-CM | POA: Diagnosis not present

## 2021-02-09 DIAGNOSIS — U071 COVID-19: Secondary | ICD-10-CM | POA: Diagnosis not present

## 2021-02-09 DIAGNOSIS — L89321 Pressure ulcer of left buttock, stage 1: Secondary | ICD-10-CM | POA: Diagnosis not present

## 2021-02-11 DIAGNOSIS — N3001 Acute cystitis with hematuria: Secondary | ICD-10-CM | POA: Diagnosis not present

## 2021-02-11 DIAGNOSIS — U071 COVID-19: Secondary | ICD-10-CM | POA: Diagnosis not present

## 2021-02-11 DIAGNOSIS — L89311 Pressure ulcer of right buttock, stage 1: Secondary | ICD-10-CM | POA: Diagnosis not present

## 2021-02-11 DIAGNOSIS — I82431 Acute embolism and thrombosis of right popliteal vein: Secondary | ICD-10-CM | POA: Diagnosis not present

## 2021-02-11 DIAGNOSIS — B9620 Unspecified Escherichia coli [E. coli] as the cause of diseases classified elsewhere: Secondary | ICD-10-CM | POA: Diagnosis not present

## 2021-02-11 DIAGNOSIS — M25462 Effusion, left knee: Secondary | ICD-10-CM | POA: Diagnosis not present

## 2021-02-11 DIAGNOSIS — I82411 Acute embolism and thrombosis of right femoral vein: Secondary | ICD-10-CM | POA: Diagnosis not present

## 2021-02-11 DIAGNOSIS — L89321 Pressure ulcer of left buttock, stage 1: Secondary | ICD-10-CM | POA: Diagnosis not present

## 2021-02-14 DIAGNOSIS — I82431 Acute embolism and thrombosis of right popliteal vein: Secondary | ICD-10-CM | POA: Diagnosis not present

## 2021-02-14 DIAGNOSIS — U071 COVID-19: Secondary | ICD-10-CM | POA: Diagnosis not present

## 2021-02-14 DIAGNOSIS — M25462 Effusion, left knee: Secondary | ICD-10-CM | POA: Diagnosis not present

## 2021-02-14 DIAGNOSIS — L89311 Pressure ulcer of right buttock, stage 1: Secondary | ICD-10-CM | POA: Diagnosis not present

## 2021-02-14 DIAGNOSIS — I82411 Acute embolism and thrombosis of right femoral vein: Secondary | ICD-10-CM | POA: Diagnosis not present

## 2021-02-14 DIAGNOSIS — L89321 Pressure ulcer of left buttock, stage 1: Secondary | ICD-10-CM | POA: Diagnosis not present

## 2021-02-14 DIAGNOSIS — N3001 Acute cystitis with hematuria: Secondary | ICD-10-CM | POA: Diagnosis not present

## 2021-02-14 DIAGNOSIS — B9620 Unspecified Escherichia coli [E. coli] as the cause of diseases classified elsewhere: Secondary | ICD-10-CM | POA: Diagnosis not present

## 2021-02-15 DIAGNOSIS — I82411 Acute embolism and thrombosis of right femoral vein: Secondary | ICD-10-CM | POA: Diagnosis not present

## 2021-02-15 DIAGNOSIS — L89321 Pressure ulcer of left buttock, stage 1: Secondary | ICD-10-CM | POA: Diagnosis not present

## 2021-02-15 DIAGNOSIS — U071 COVID-19: Secondary | ICD-10-CM | POA: Diagnosis not present

## 2021-02-15 DIAGNOSIS — L89311 Pressure ulcer of right buttock, stage 1: Secondary | ICD-10-CM | POA: Diagnosis not present

## 2021-02-15 DIAGNOSIS — B9620 Unspecified Escherichia coli [E. coli] as the cause of diseases classified elsewhere: Secondary | ICD-10-CM | POA: Diagnosis not present

## 2021-02-15 DIAGNOSIS — N3001 Acute cystitis with hematuria: Secondary | ICD-10-CM | POA: Diagnosis not present

## 2021-02-15 DIAGNOSIS — M25462 Effusion, left knee: Secondary | ICD-10-CM | POA: Diagnosis not present

## 2021-02-15 DIAGNOSIS — I82431 Acute embolism and thrombosis of right popliteal vein: Secondary | ICD-10-CM | POA: Diagnosis not present

## 2021-02-16 DIAGNOSIS — I82431 Acute embolism and thrombosis of right popliteal vein: Secondary | ICD-10-CM | POA: Diagnosis not present

## 2021-02-16 DIAGNOSIS — I82411 Acute embolism and thrombosis of right femoral vein: Secondary | ICD-10-CM | POA: Diagnosis not present

## 2021-02-16 DIAGNOSIS — M25462 Effusion, left knee: Secondary | ICD-10-CM | POA: Diagnosis not present

## 2021-02-16 DIAGNOSIS — L89311 Pressure ulcer of right buttock, stage 1: Secondary | ICD-10-CM | POA: Diagnosis not present

## 2021-02-16 DIAGNOSIS — U071 COVID-19: Secondary | ICD-10-CM | POA: Diagnosis not present

## 2021-02-16 DIAGNOSIS — B9620 Unspecified Escherichia coli [E. coli] as the cause of diseases classified elsewhere: Secondary | ICD-10-CM | POA: Diagnosis not present

## 2021-02-16 DIAGNOSIS — L89321 Pressure ulcer of left buttock, stage 1: Secondary | ICD-10-CM | POA: Diagnosis not present

## 2021-02-16 DIAGNOSIS — N3001 Acute cystitis with hematuria: Secondary | ICD-10-CM | POA: Diagnosis not present

## 2021-02-21 DIAGNOSIS — M25462 Effusion, left knee: Secondary | ICD-10-CM | POA: Diagnosis not present

## 2021-02-21 DIAGNOSIS — B9620 Unspecified Escherichia coli [E. coli] as the cause of diseases classified elsewhere: Secondary | ICD-10-CM | POA: Diagnosis not present

## 2021-02-21 DIAGNOSIS — U071 COVID-19: Secondary | ICD-10-CM | POA: Diagnosis not present

## 2021-02-21 DIAGNOSIS — L89311 Pressure ulcer of right buttock, stage 1: Secondary | ICD-10-CM | POA: Diagnosis not present

## 2021-02-21 DIAGNOSIS — I82411 Acute embolism and thrombosis of right femoral vein: Secondary | ICD-10-CM | POA: Diagnosis not present

## 2021-02-21 DIAGNOSIS — L89321 Pressure ulcer of left buttock, stage 1: Secondary | ICD-10-CM | POA: Diagnosis not present

## 2021-02-21 DIAGNOSIS — N3001 Acute cystitis with hematuria: Secondary | ICD-10-CM | POA: Diagnosis not present

## 2021-02-21 DIAGNOSIS — I82431 Acute embolism and thrombosis of right popliteal vein: Secondary | ICD-10-CM | POA: Diagnosis not present

## 2021-02-24 DIAGNOSIS — N3001 Acute cystitis with hematuria: Secondary | ICD-10-CM | POA: Diagnosis not present

## 2021-02-24 DIAGNOSIS — M25462 Effusion, left knee: Secondary | ICD-10-CM | POA: Diagnosis not present

## 2021-02-24 DIAGNOSIS — I82411 Acute embolism and thrombosis of right femoral vein: Secondary | ICD-10-CM | POA: Diagnosis not present

## 2021-02-24 DIAGNOSIS — B9620 Unspecified Escherichia coli [E. coli] as the cause of diseases classified elsewhere: Secondary | ICD-10-CM | POA: Diagnosis not present

## 2021-02-24 DIAGNOSIS — I82431 Acute embolism and thrombosis of right popliteal vein: Secondary | ICD-10-CM | POA: Diagnosis not present

## 2021-02-24 DIAGNOSIS — L89321 Pressure ulcer of left buttock, stage 1: Secondary | ICD-10-CM | POA: Diagnosis not present

## 2021-02-24 DIAGNOSIS — U071 COVID-19: Secondary | ICD-10-CM | POA: Diagnosis not present

## 2021-02-24 DIAGNOSIS — L89311 Pressure ulcer of right buttock, stage 1: Secondary | ICD-10-CM | POA: Diagnosis not present

## 2021-03-01 DIAGNOSIS — I82411 Acute embolism and thrombosis of right femoral vein: Secondary | ICD-10-CM | POA: Diagnosis not present

## 2021-03-01 DIAGNOSIS — N3001 Acute cystitis with hematuria: Secondary | ICD-10-CM | POA: Diagnosis not present

## 2021-03-01 DIAGNOSIS — M25462 Effusion, left knee: Secondary | ICD-10-CM | POA: Diagnosis not present

## 2021-03-01 DIAGNOSIS — I82431 Acute embolism and thrombosis of right popliteal vein: Secondary | ICD-10-CM | POA: Diagnosis not present

## 2021-03-01 DIAGNOSIS — B9620 Unspecified Escherichia coli [E. coli] as the cause of diseases classified elsewhere: Secondary | ICD-10-CM | POA: Diagnosis not present

## 2021-03-01 DIAGNOSIS — L89311 Pressure ulcer of right buttock, stage 1: Secondary | ICD-10-CM | POA: Diagnosis not present

## 2021-03-01 DIAGNOSIS — L89321 Pressure ulcer of left buttock, stage 1: Secondary | ICD-10-CM | POA: Diagnosis not present

## 2021-03-01 DIAGNOSIS — U071 COVID-19: Secondary | ICD-10-CM | POA: Diagnosis not present

## 2021-03-03 DIAGNOSIS — M25462 Effusion, left knee: Secondary | ICD-10-CM | POA: Diagnosis not present

## 2021-03-03 DIAGNOSIS — B9620 Unspecified Escherichia coli [E. coli] as the cause of diseases classified elsewhere: Secondary | ICD-10-CM | POA: Diagnosis not present

## 2021-03-03 DIAGNOSIS — I82431 Acute embolism and thrombosis of right popliteal vein: Secondary | ICD-10-CM | POA: Diagnosis not present

## 2021-03-03 DIAGNOSIS — N3001 Acute cystitis with hematuria: Secondary | ICD-10-CM | POA: Diagnosis not present

## 2021-03-03 DIAGNOSIS — L89321 Pressure ulcer of left buttock, stage 1: Secondary | ICD-10-CM | POA: Diagnosis not present

## 2021-03-03 DIAGNOSIS — U071 COVID-19: Secondary | ICD-10-CM | POA: Diagnosis not present

## 2021-03-03 DIAGNOSIS — I82411 Acute embolism and thrombosis of right femoral vein: Secondary | ICD-10-CM | POA: Diagnosis not present

## 2021-03-03 DIAGNOSIS — L89311 Pressure ulcer of right buttock, stage 1: Secondary | ICD-10-CM | POA: Diagnosis not present

## 2021-03-04 DIAGNOSIS — L89311 Pressure ulcer of right buttock, stage 1: Secondary | ICD-10-CM | POA: Diagnosis not present

## 2021-03-04 DIAGNOSIS — B9620 Unspecified Escherichia coli [E. coli] as the cause of diseases classified elsewhere: Secondary | ICD-10-CM | POA: Diagnosis not present

## 2021-03-04 DIAGNOSIS — I82431 Acute embolism and thrombosis of right popliteal vein: Secondary | ICD-10-CM | POA: Diagnosis not present

## 2021-03-04 DIAGNOSIS — I82411 Acute embolism and thrombosis of right femoral vein: Secondary | ICD-10-CM | POA: Diagnosis not present

## 2021-03-04 DIAGNOSIS — U071 COVID-19: Secondary | ICD-10-CM | POA: Diagnosis not present

## 2021-03-04 DIAGNOSIS — L89321 Pressure ulcer of left buttock, stage 1: Secondary | ICD-10-CM | POA: Diagnosis not present

## 2021-03-04 DIAGNOSIS — M25462 Effusion, left knee: Secondary | ICD-10-CM | POA: Diagnosis not present

## 2021-03-04 DIAGNOSIS — N3001 Acute cystitis with hematuria: Secondary | ICD-10-CM | POA: Diagnosis not present

## 2021-03-08 DIAGNOSIS — I82431 Acute embolism and thrombosis of right popliteal vein: Secondary | ICD-10-CM | POA: Diagnosis not present

## 2021-03-08 DIAGNOSIS — L89321 Pressure ulcer of left buttock, stage 1: Secondary | ICD-10-CM | POA: Diagnosis not present

## 2021-03-08 DIAGNOSIS — B9620 Unspecified Escherichia coli [E. coli] as the cause of diseases classified elsewhere: Secondary | ICD-10-CM | POA: Diagnosis not present

## 2021-03-08 DIAGNOSIS — I82411 Acute embolism and thrombosis of right femoral vein: Secondary | ICD-10-CM | POA: Diagnosis not present

## 2021-03-08 DIAGNOSIS — U071 COVID-19: Secondary | ICD-10-CM | POA: Diagnosis not present

## 2021-03-08 DIAGNOSIS — N3001 Acute cystitis with hematuria: Secondary | ICD-10-CM | POA: Diagnosis not present

## 2021-03-08 DIAGNOSIS — L89311 Pressure ulcer of right buttock, stage 1: Secondary | ICD-10-CM | POA: Diagnosis not present

## 2021-03-08 DIAGNOSIS — M25462 Effusion, left knee: Secondary | ICD-10-CM | POA: Diagnosis not present

## 2021-03-11 DIAGNOSIS — R413 Other amnesia: Secondary | ICD-10-CM | POA: Diagnosis not present

## 2021-03-11 DIAGNOSIS — I82409 Acute embolism and thrombosis of unspecified deep veins of unspecified lower extremity: Secondary | ICD-10-CM | POA: Diagnosis not present

## 2021-03-16 DIAGNOSIS — M25462 Effusion, left knee: Secondary | ICD-10-CM | POA: Diagnosis not present

## 2021-03-16 DIAGNOSIS — I82431 Acute embolism and thrombosis of right popliteal vein: Secondary | ICD-10-CM | POA: Diagnosis not present

## 2021-03-16 DIAGNOSIS — I82411 Acute embolism and thrombosis of right femoral vein: Secondary | ICD-10-CM | POA: Diagnosis not present

## 2021-03-16 DIAGNOSIS — U071 COVID-19: Secondary | ICD-10-CM | POA: Diagnosis not present

## 2021-03-16 DIAGNOSIS — L89321 Pressure ulcer of left buttock, stage 1: Secondary | ICD-10-CM | POA: Diagnosis not present

## 2021-03-16 DIAGNOSIS — N3001 Acute cystitis with hematuria: Secondary | ICD-10-CM | POA: Diagnosis not present

## 2021-03-16 DIAGNOSIS — L89311 Pressure ulcer of right buttock, stage 1: Secondary | ICD-10-CM | POA: Diagnosis not present

## 2021-03-16 DIAGNOSIS — B9620 Unspecified Escherichia coli [E. coli] as the cause of diseases classified elsewhere: Secondary | ICD-10-CM | POA: Diagnosis not present

## 2021-03-22 DIAGNOSIS — L89321 Pressure ulcer of left buttock, stage 1: Secondary | ICD-10-CM | POA: Diagnosis not present

## 2021-03-22 DIAGNOSIS — I82431 Acute embolism and thrombosis of right popliteal vein: Secondary | ICD-10-CM | POA: Diagnosis not present

## 2021-03-22 DIAGNOSIS — B9620 Unspecified Escherichia coli [E. coli] as the cause of diseases classified elsewhere: Secondary | ICD-10-CM | POA: Diagnosis not present

## 2021-03-22 DIAGNOSIS — N3001 Acute cystitis with hematuria: Secondary | ICD-10-CM | POA: Diagnosis not present

## 2021-03-22 DIAGNOSIS — U071 COVID-19: Secondary | ICD-10-CM | POA: Diagnosis not present

## 2021-03-22 DIAGNOSIS — M25462 Effusion, left knee: Secondary | ICD-10-CM | POA: Diagnosis not present

## 2021-03-22 DIAGNOSIS — L89311 Pressure ulcer of right buttock, stage 1: Secondary | ICD-10-CM | POA: Diagnosis not present

## 2021-03-22 DIAGNOSIS — I82411 Acute embolism and thrombosis of right femoral vein: Secondary | ICD-10-CM | POA: Diagnosis not present

## 2021-04-01 DIAGNOSIS — B9620 Unspecified Escherichia coli [E. coli] as the cause of diseases classified elsewhere: Secondary | ICD-10-CM | POA: Diagnosis not present

## 2021-04-01 DIAGNOSIS — M25462 Effusion, left knee: Secondary | ICD-10-CM | POA: Diagnosis not present

## 2021-04-01 DIAGNOSIS — U071 COVID-19: Secondary | ICD-10-CM | POA: Diagnosis not present

## 2021-04-01 DIAGNOSIS — I82431 Acute embolism and thrombosis of right popliteal vein: Secondary | ICD-10-CM | POA: Diagnosis not present

## 2021-04-01 DIAGNOSIS — L89311 Pressure ulcer of right buttock, stage 1: Secondary | ICD-10-CM | POA: Diagnosis not present

## 2021-04-01 DIAGNOSIS — L89321 Pressure ulcer of left buttock, stage 1: Secondary | ICD-10-CM | POA: Diagnosis not present

## 2021-04-01 DIAGNOSIS — N3001 Acute cystitis with hematuria: Secondary | ICD-10-CM | POA: Diagnosis not present

## 2021-04-01 DIAGNOSIS — I82411 Acute embolism and thrombosis of right femoral vein: Secondary | ICD-10-CM | POA: Diagnosis not present

## 2021-04-21 DIAGNOSIS — L282 Other prurigo: Secondary | ICD-10-CM | POA: Diagnosis not present

## 2021-05-20 ENCOUNTER — Inpatient Hospital Stay (HOSPITAL_COMMUNITY)
Admission: EM | Admit: 2021-05-20 | Discharge: 2021-05-23 | DRG: 689 | Disposition: A | Discharge status: Skilled Nursing Facility | Payer: Medicare PPO | Attending: Family Medicine | Admitting: Family Medicine

## 2021-05-20 ENCOUNTER — Emergency Department (HOSPITAL_COMMUNITY): Payer: Medicare PPO

## 2021-05-20 ENCOUNTER — Encounter (HOSPITAL_COMMUNITY): Payer: Self-pay

## 2021-05-20 ENCOUNTER — Other Ambulatory Visit: Payer: Self-pay

## 2021-05-20 DIAGNOSIS — R531 Weakness: Secondary | ICD-10-CM | POA: Diagnosis not present

## 2021-05-20 DIAGNOSIS — Z20822 Contact with and (suspected) exposure to covid-19: Secondary | ICD-10-CM | POA: Diagnosis not present

## 2021-05-20 DIAGNOSIS — R011 Cardiac murmur, unspecified: Secondary | ICD-10-CM | POA: Diagnosis not present

## 2021-05-20 DIAGNOSIS — M6281 Muscle weakness (generalized): Secondary | ICD-10-CM | POA: Diagnosis not present

## 2021-05-20 DIAGNOSIS — K219 Gastro-esophageal reflux disease without esophagitis: Secondary | ICD-10-CM | POA: Diagnosis not present

## 2021-05-20 DIAGNOSIS — R5381 Other malaise: Secondary | ICD-10-CM | POA: Diagnosis not present

## 2021-05-20 DIAGNOSIS — R079 Chest pain, unspecified: Secondary | ICD-10-CM | POA: Diagnosis not present

## 2021-05-20 DIAGNOSIS — I351 Nonrheumatic aortic (valve) insufficiency: Secondary | ICD-10-CM | POA: Diagnosis present

## 2021-05-20 DIAGNOSIS — L8990 Pressure ulcer of unspecified site, unspecified stage: Secondary | ICD-10-CM | POA: Diagnosis not present

## 2021-05-20 DIAGNOSIS — Z880 Allergy status to penicillin: Secondary | ICD-10-CM

## 2021-05-20 DIAGNOSIS — Z79899 Other long term (current) drug therapy: Secondary | ICD-10-CM | POA: Diagnosis not present

## 2021-05-20 DIAGNOSIS — I5032 Chronic diastolic (congestive) heart failure: Secondary | ICD-10-CM | POA: Diagnosis present

## 2021-05-20 DIAGNOSIS — Z86718 Personal history of other venous thrombosis and embolism: Secondary | ICD-10-CM | POA: Diagnosis not present

## 2021-05-20 DIAGNOSIS — B372 Candidiasis of skin and nail: Secondary | ICD-10-CM | POA: Diagnosis present

## 2021-05-20 DIAGNOSIS — G9341 Metabolic encephalopathy: Secondary | ICD-10-CM | POA: Diagnosis present

## 2021-05-20 DIAGNOSIS — Z923 Personal history of irradiation: Secondary | ICD-10-CM | POA: Diagnosis not present

## 2021-05-20 DIAGNOSIS — F419 Anxiety disorder, unspecified: Secondary | ICD-10-CM | POA: Diagnosis present

## 2021-05-20 DIAGNOSIS — E860 Dehydration: Secondary | ICD-10-CM | POA: Diagnosis present

## 2021-05-20 DIAGNOSIS — Z9012 Acquired absence of left breast and nipple: Secondary | ICD-10-CM

## 2021-05-20 DIAGNOSIS — Z7401 Bed confinement status: Secondary | ICD-10-CM | POA: Diagnosis not present

## 2021-05-20 DIAGNOSIS — N390 Urinary tract infection, site not specified: Principal | ICD-10-CM | POA: Diagnosis present

## 2021-05-20 DIAGNOSIS — I5189 Other ill-defined heart diseases: Secondary | ICD-10-CM | POA: Diagnosis not present

## 2021-05-20 DIAGNOSIS — I5030 Unspecified diastolic (congestive) heart failure: Secondary | ICD-10-CM | POA: Diagnosis present

## 2021-05-20 DIAGNOSIS — Z8249 Family history of ischemic heart disease and other diseases of the circulatory system: Secondary | ICD-10-CM

## 2021-05-20 DIAGNOSIS — I110 Hypertensive heart disease with heart failure: Secondary | ICD-10-CM | POA: Diagnosis present

## 2021-05-20 DIAGNOSIS — L24A0 Irritant contact dermatitis due to friction or contact with body fluids, unspecified: Secondary | ICD-10-CM | POA: Diagnosis present

## 2021-05-20 DIAGNOSIS — R52 Pain, unspecified: Secondary | ICD-10-CM | POA: Diagnosis not present

## 2021-05-20 DIAGNOSIS — Z9221 Personal history of antineoplastic chemotherapy: Secondary | ICD-10-CM

## 2021-05-20 DIAGNOSIS — K760 Fatty (change of) liver, not elsewhere classified: Secondary | ICD-10-CM | POA: Diagnosis not present

## 2021-05-20 DIAGNOSIS — I10 Essential (primary) hypertension: Secondary | ICD-10-CM | POA: Diagnosis present

## 2021-05-20 DIAGNOSIS — F32A Depression, unspecified: Secondary | ICD-10-CM | POA: Diagnosis present

## 2021-05-20 DIAGNOSIS — R279 Unspecified lack of coordination: Secondary | ICD-10-CM | POA: Diagnosis not present

## 2021-05-20 DIAGNOSIS — E785 Hyperlipidemia, unspecified: Secondary | ICD-10-CM | POA: Diagnosis present

## 2021-05-20 DIAGNOSIS — L8942 Pressure ulcer of contiguous site of back, buttock and hip, stage 2: Secondary | ICD-10-CM | POA: Diagnosis not present

## 2021-05-20 DIAGNOSIS — Z853 Personal history of malignant neoplasm of breast: Secondary | ICD-10-CM

## 2021-05-20 DIAGNOSIS — M810 Age-related osteoporosis without current pathological fracture: Secondary | ICD-10-CM | POA: Diagnosis present

## 2021-05-20 DIAGNOSIS — Z7901 Long term (current) use of anticoagulants: Secondary | ICD-10-CM

## 2021-05-20 DIAGNOSIS — M79604 Pain in right leg: Secondary | ICD-10-CM | POA: Diagnosis not present

## 2021-05-20 DIAGNOSIS — R41841 Cognitive communication deficit: Secondary | ICD-10-CM | POA: Diagnosis not present

## 2021-05-20 DIAGNOSIS — R2689 Other abnormalities of gait and mobility: Secondary | ICD-10-CM | POA: Diagnosis not present

## 2021-05-20 DIAGNOSIS — M79671 Pain in right foot: Secondary | ICD-10-CM | POA: Diagnosis not present

## 2021-05-20 DIAGNOSIS — L309 Dermatitis, unspecified: Secondary | ICD-10-CM | POA: Diagnosis not present

## 2021-05-20 DIAGNOSIS — I700 Atherosclerosis of aorta: Secondary | ICD-10-CM | POA: Diagnosis not present

## 2021-05-20 LAB — CBC WITH DIFFERENTIAL/PLATELET
Abs Immature Granulocytes: 0.03 10*3/uL (ref 0.00–0.07)
Basophils Absolute: 0 10*3/uL (ref 0.0–0.1)
Basophils Relative: 0 %
Eosinophils Absolute: 0.1 10*3/uL (ref 0.0–0.5)
Eosinophils Relative: 1 %
HCT: 39.4 % (ref 36.0–46.0)
Hemoglobin: 12.6 g/dL (ref 12.0–15.0)
Immature Granulocytes: 0 %
Lymphocytes Relative: 10 %
Lymphs Abs: 0.9 10*3/uL (ref 0.7–4.0)
MCH: 33.1 pg (ref 26.0–34.0)
MCHC: 32 g/dL (ref 30.0–36.0)
MCV: 103.4 fL — ABNORMAL HIGH (ref 80.0–100.0)
Monocytes Absolute: 0.6 10*3/uL (ref 0.1–1.0)
Monocytes Relative: 6 %
Neutro Abs: 7.7 10*3/uL (ref 1.7–7.7)
Neutrophils Relative %: 83 %
Platelets: 186 10*3/uL (ref 150–400)
RBC: 3.81 MIL/uL — ABNORMAL LOW (ref 3.87–5.11)
RDW: 13.5 % (ref 11.5–15.5)
WBC: 9.3 10*3/uL (ref 4.0–10.5)
nRBC: 0 % (ref 0.0–0.2)

## 2021-05-20 LAB — COMPREHENSIVE METABOLIC PANEL
ALT: 15 U/L (ref 0–44)
AST: 19 U/L (ref 15–41)
Albumin: 3.9 g/dL (ref 3.5–5.0)
Alkaline Phosphatase: 94 U/L (ref 38–126)
Anion gap: 8 (ref 5–15)
BUN: 36 mg/dL — ABNORMAL HIGH (ref 8–23)
CO2: 23 mmol/L (ref 22–32)
Calcium: 9.4 mg/dL (ref 8.9–10.3)
Chloride: 109 mmol/L (ref 98–111)
Creatinine, Ser: 0.76 mg/dL (ref 0.44–1.00)
GFR, Estimated: >60 mL/min (ref >60)
Glucose, Bld: 102 mg/dL — ABNORMAL HIGH (ref 70–99)
Potassium: 3.7 mmol/L (ref 3.5–5.1)
Sodium: 140 mmol/L (ref 135–145)
Total Bilirubin: 0.6 mg/dL (ref 0.3–1.2)
Total Protein: 7.4 g/dL (ref 6.5–8.1)

## 2021-05-20 LAB — URINALYSIS, MICROSCOPIC (REFLEX)

## 2021-05-20 LAB — URINALYSIS, ROUTINE W REFLEX MICROSCOPIC
Bilirubin Urine: NEGATIVE
Glucose, UA: NEGATIVE mg/dL
Ketones, ur: NEGATIVE mg/dL
Nitrite: NEGATIVE
Protein, ur: NEGATIVE mg/dL
Specific Gravity, Urine: >1.03 — ABNORMAL HIGH (ref 1.005–1.030)
pH: 5.5 (ref 5.0–8.0)

## 2021-05-20 LAB — RESP PANEL BY RT-PCR (FLU A&B, COVID) ARPGX2
Influenza A by PCR: NEGATIVE
Influenza B by PCR: NEGATIVE
SARS Coronavirus 2 by RT PCR: NEGATIVE

## 2021-05-20 MED ORDER — SODIUM CHLORIDE 0.9 % IV BOLUS
500.0000 mL | Freq: Once | INTRAVENOUS | Status: AC
  Administered 2021-05-20: 500 mL via INTRAVENOUS

## 2021-05-20 MED ORDER — ACETAMINOPHEN 325 MG PO TABS
650.0000 mg | ORAL_TABLET | Freq: Once | ORAL | Status: AC
  Administered 2021-05-20: 650 mg via ORAL
  Filled 2021-05-20: qty 2

## 2021-05-20 MED ORDER — SODIUM CHLORIDE 0.9 % IV SOLN
1.0000 g | Freq: Once | INTRAVENOUS | Status: AC
  Administered 2021-05-20: 1 g via INTRAVENOUS
  Filled 2021-05-20: qty 10

## 2021-05-20 NOTE — ED Provider Notes (Signed)
[facility] EMERGENCY DEPARTMENT Provider Note   CSN: [id number] Arrival date & time: 05/20/21  1516     History Chief Complaint  Patient presents with   Leg Pain    Tammy Terry is a 80 y.o. adult with a history of anxiety and depression, hypertension, hyperlipidemia distant history of breast cancer who was  diagnosed with a DVT in August 2022 of her left lower extremity, on daily Eliquis presenting for complaints of generalized weakness and right lower leg pain.  Husband at the bedside states that she has been unwilling to get out of her recliner chair and has sat in this chair for the last 1 and half weeks without ambulating.  He states that she has urinated and defecated on herself during this time.  He has tried his best to keep her cleaned up but he is weak himself and does not have the ability to lift her or help her ambulate.  She denies any swelling or injury to this leg, but it is noted that she has some edema and subtle bruising on her right foot dorsum.  However she is not tender in her foot, she has pain on the anterior of her right tibia.  She denies chest pain, shortness of breath, denies fevers or chills.  She denies dysuria.  She was admitted here in August for similar symptoms including a UTI at that time at which time rehab placement was recommended but patient deferred but was willing to have home physical therapy.  Her husband states that the physical therapist had her up and walking and she was doing much better but after the PT ended she went right back to sitting in her chair.  Her only other complaint at this time is thirst.  Husband states that she has been eating and drinking.  The history is provided by the patient, the spouse and medical records.      Past Medical History:  Diagnosis Date   Acquired absence of unspecified breast and nipple    Age-related osteoporosis without current pathological fracture    Age-related osteoporosis without current pathological  fracture    Anxiety    Anxiety disorder, unspecified    Cancer ([place]) 1994   BREAST CANCER, TREATED WITH RADIATION AND CHEMOTHERAPY   Depression with anxiety 03/01/2012   Essential (primary) hypertension    Fatty (change of) liver, not elsewhere classified    Fatty liver    H/O vitamin D deficiency    Hyperlipidemia 03/01/2012   Hypertension    Insomnia, unspecified    Localized edema    Lumbago    Major depressive disorder, single episode, unspecified    Malignant neoplasm of unspecified site of unspecified female breast ([place])    Osteoporosis    Other abnormalities of gait and mobility    Polyosteoarthritis, unspecified    Radiculopathy, site unspecified    Rosacea    Syncope and collapse    Tremor, unspecified    Unspecified convulsions ([place])    Unspecified fall, initial encounter     Patient Active Problem List   Diagnosis Date Noted   Rt Leg DVT (deep venous thrombosis)/Femoral Vein and Popliteal Vein 02/02/2021   Pressure injury of skin 02/01/2021   Generalized weakness 01/31/2021   COVID-19 virus infection 01/31/2021   Acute lower UTI 01/31/2021   Age-related osteoporosis without current pathological fracture    Polyosteoarthritis, unspecified    Acquired absence of unspecified breast and nipple    Anxiety disorder, unspecified    Major  depressive disorder, single episode, unspecified    Essential (primary) hypertension    Fatty (change of) liver, not elsewhere classified    Insomnia, unspecified    Localized edema    Malignant neoplasm of unspecified site of unspecified female breast ([place])    Other abnormalities of gait and mobility    Radiculopathy, site unspecified    Syncope and collapse    Tremor, unspecified    Unspecified convulsions ([place])    Unspecified fall, initial encounter    Cellulitis and abscess of left leg 04/01/2018   Cellulitis of left leg 04/01/2018   Osteoporosis    Abdominal distention 02/03/2015   Osteopenia 03/18/2013   Essential  hypertension, benign 10/22/2012   Fatty liver 10/22/2012   Elevated liver enzymes 10/22/2012   Breast cancer, stage 3 ([place]) 03/01/2012   Depression with anxiety 03/01/2012   Hyperlipidemia 03/01/2012    Past Surgical History:  Procedure Laterality Date   [place]   LEFT MASTECTOMY    CAPSULOTOMY  august 2015   of left eye   CATARACT EXTRACTION Left    CESAREAN SECTION     COLONOSCOPY N/A 05/13/2014   Procedure: COLONOSCOPY;  Surgeon: Rogene Houston, MD;  Location: [facility];  Service: Endoscopy;  Laterality: N/A;  79   MASTECTOMY     left breast for cancer in [place]       OB History     Gravida  1   Para  1   Term      Preterm      AB      Living  1      SAB      IAB      Ectopic      Multiple      Live Births              Family History  Problem Relation Age of Onset   Heart attack Father    Diabetes Paternal Grandmother     Social History   Tobacco Use   Smoking status: Never   Smokeless tobacco: Never  Substance Use Topics   Alcohol use: No    Alcohol/week: 0.0 standard drinks   Drug use: No    Home Medications Prior to Admission medications   Medication Sig Start Date End Date Taking? Authorizing Provider  acetaminophen (TYLENOL) 325 MG tablet Take 650 mg by mouth every 6 (six) hours as needed for headache.    [provider]  amLODipine (NORVASC) 2.5 MG tablet Take 1 tablet (2.5 mg total) by mouth daily. FOR BP 02/03/21 02/03/22  Roxan Hockey, MD  apixaban (ELIQUIS) 5 MG TABS tablet Take 1 tablet (5 mg total) by mouth 2 (two) times daily. 03/03/21   Roxan Hockey, MD  Apixaban Starter Pack, 10mg  and 5mg , (ELIQUIS DVT/PE STARTER PACK) Take as directed on package: start with two-5mg  tablets twice daily for 7 days. On day 8, switch to one-5mg  tablet twice daily. 02/03/21   Roxan Hockey, MD  ascorbic acid (VITAMIN C) 500 MG tablet Take 1 tablet (500 mg total) by mouth daily. 02/04/21    Roxan Hockey, MD  ketoconazole (NIZORAL) 2 % shampoo Apply topically. 04/21/21   [provider]  pantoprazole (PROTONIX) 40 MG tablet Take 1 tablet (40 mg total) by mouth daily. 02/03/21 02/03/22  Roxan Hockey, MD  zinc sulfate 220 (50 Zn) MG capsule Take 1 capsule (220 mg total) by mouth daily. 02/04/21   Roxan Hockey, MD  Allergies    Amoxicillin, Codeine, and Penicillins  Review of Systems   Review of Systems  Constitutional:  Negative for chills and fever.  HENT:  Negative for congestion and sore throat.   Eyes: Negative.   Respiratory:  Negative for chest tightness and shortness of breath.   Cardiovascular:  Negative for chest pain.  Gastrointestinal:  Negative for abdominal pain, nausea and vomiting.  Genitourinary: Negative.   Musculoskeletal:  Positive for arthralgias. Negative for joint swelling and neck pain.  Skin:  Positive for color change and rash. Negative for wound.  Neurological:  Positive for weakness. Negative for dizziness, light-headedness, numbness and headaches.  Psychiatric/Behavioral: Negative.    All other systems reviewed and are negative.  Physical Exam Updated Vital Signs BP (!) 120/54   Pulse 87   Temp 98.3 F (36.8 C) (Oral)   Resp 18   Ht 5\' 6"  (1.676 m)   Wt 70 kg   SpO2 100%   BMI 24.91 kg/m   Physical Exam Vitals and nursing note reviewed.  Constitutional:      Appearance: He is well-developed.     Comments: Not clearly confused, but defers to husband to answer most questions.   HENT:     Head: Normocephalic and atraumatic.     Mouth/Throat:     Mouth: Mucous membranes are dry.  Eyes:     Conjunctiva/sclera: Conjunctivae normal.  Cardiovascular:     Rate and Rhythm: Normal rate and regular rhythm.     Heart sounds: Murmur heard.  Pulmonary:     Effort: Pulmonary effort is normal.     Breath sounds: Normal breath sounds. No wheezing.  Abdominal:     General: Bowel sounds are normal.     Palpations: Abdomen  is soft.     Tenderness: There is no abdominal tenderness.  Musculoskeletal:        General: No tenderness. Normal range of motion.     Cervical back: Normal range of motion.  Skin:    General: Skin is warm and dry.     Findings: Erythema present.     Comments: Anterior tibias pink, shiny, changes c/w venous stasis, nontender.  No calf tenderness.  Mild edema and bruising right foot dorsum.  Scaling skin on feet.  Macerated skin intertriginous abdomen and groin with excoriations and scabbing along borders. Buttocks with general erythema, no obvious skin breakdown.   Neurological:     Mental Status: He is alert.    ED Results / Procedures / Treatments   Labs (all labs ordered are listed, but only abnormal results are displayed) Labs Reviewed  URINALYSIS, ROUTINE W REFLEX MICROSCOPIC - Abnormal; Notable for the following components:      Result Value   APPearance HAZY (*)    Specific Gravity, Urine >1.030 (*)    Hgb urine dipstick SMALL (*)    Leukocytes,Ua MODERATE (*)    All other components within normal limits  CBC WITH DIFFERENTIAL/PLATELET - Abnormal; Notable for the following components:   RBC 3.81 (*)    MCV 103.4 (*)    All other components within normal limits  COMPREHENSIVE METABOLIC PANEL - Abnormal; Notable for the following components:   Glucose, Bld 102 (*)    BUN 36 (*)    All other components within normal limits  URINALYSIS, MICROSCOPIC (REFLEX) - Abnormal; Notable for the following components:   Bacteria, UA MANY (*)    All other components within normal limits    EKG None  Radiology DG Chest  2 View  Result Date: 05/20/2021 CLINICAL DATA:  Pain recent fall EXAM: CHEST - 2 VIEW COMPARISON:  01/31/2021 FINDINGS: The heart size and mediastinal contours are within normal limits. Both lungs are clear. The visualized skeletal structures are unremarkable. Surgical changes at the left axilla. Aortic atherosclerosis. Chronic appearing right-sided rib fractures.  IMPRESSION: No active cardiopulmonary disease. Electronically Signed   By: Donavan Foil M.D.   On: 05/20/2021 18:36   DG Foot 2 Views Right  Result Date: 05/20/2021 CLINICAL DATA:  Foot pain recent fall EXAM: RIGHT FOOT - 2 VIEW COMPARISON:  01/24/2016 FINDINGS: Bones appear osteopenic which limits the exam. No malalignment. Degenerative changes at the first MTP joint. Questionable fracture deformity at the neck of the fifth proximal phalanx on oblique view. IMPRESSION: Limited by osteopenia. Questionable fracture deformity at the neck of fifth proximal phalanx, correlate for point tenderness Electronically Signed   By: Donavan Foil M.D.   On: 05/20/2021 18:34    Procedures Procedures   Medications Ordered in ED Medications  acetaminophen (TYLENOL) tablet 650 mg (650 mg Oral Given 05/20/21 1750)  sodium chloride 0.9 % bolus 500 mL (500 mLs Intravenous New Bag/Given 05/20/21 1940)    ED Course  I have reviewed the triage vital signs and the nursing notes.  Pertinent labs & imaging results that were available during my care of the patient were reviewed by me and considered in my medical decision making (see chart for details).    MDM Rules/Calculators/A&P                           Labs and disposition pending at this time, patient signed out to [facility], [place].  I suspect patient will require admission given weakness and inability to ambulate.  Per husband's report of her current inability to care for herself and with his limitations at home she would also benefit from rehab or repeat home PT.  However, husband states that he cannot take care of her in her current state.  Hopefully patient will be agreeable to rehab placement. Final Clinical Impression(s) / ED Diagnoses Final diagnoses:  None    Rx / DC Orders ED Discharge Orders     None        Landis Martins 05/20/21 2009    Fredia Sorrow, MD 05/26/21 0000

## 2021-05-20 NOTE — ED Provider Notes (Signed)
   Patient signed out to me by Tammy Jefferson, PA-C pending completion of work-up.  Patient is an 80 year old female that comes from home with complaint of right leg pain.  Patient has been sitting in a chair for 1-1/2 weeks unable to stand or walk per spouse.  Patient has redness and excoriations throughout her lower abdomen, buttocks, and pelvic area.  Patient was soiled with urine and feces on arrival.  On my exam, patient pleasantly confused.  Follows commands.  Vital signs reassuring.  She is nontoxic-appearing.  Lower abdomen, buttocks and vaginal area have multiple excoriations and redness that appears consistent with early skin breakdown.  No open wounds.  Patient unable to walk or stand.  Labs interpreted by me, no leukocytosis.  Hemoglobin reassuring.  No electrolyte derangement.  Urinalysis shows likely urinary tract infection.  Urine culture pending.  I spoke with patient's spouse, Tammy Terry who confirmed her inability to walk or care for herself at home.  He states that he is health is declining and he is also unable to care for her.  He is agreeable to either arrangement for home health or nursing home placement.  She has been given Rocephin for likely urinary tract infection.  Will consult hospitalist for admission.  Discussed findings with [facility] hospitalist Dr. Clearence Ped who agrees to admit     Tammy Parkinson, PA-C 05/20/21 2249    Tammy Sorrow, MD 05/26/21 0002

## 2021-05-20 NOTE — ED Notes (Signed)
Husband at beside

## 2021-05-20 NOTE — ED Triage Notes (Addendum)
Patient reports that she has been having right leg pain for the past 1.5 weeks. EMS states that patient has frequent falls and husband has left patient in chair for the past 1.5 weeks because she falls every time she gets up. Patient has redness, excoriation and bleeding to buttocks with scabbing to vaginal area and posterior thighs. Soiled in urine and feces. Patient is confused and unable to answer questions appropriately.

## 2021-05-20 NOTE — ED Notes (Addendum)
Upon assessment pt has redness all the way from her stomach down to knees. Pt is also blistered inside and around perineum. No signs of infection on skin noted. Pt states that she has been sitting in her chair x1 week. States that she cleans herself and that her husband also takes care of her. Pt not aware of her skin condition.   Pt cleaned by this RN and two techs. Barrier cream applied at all areas and placed on purwick and clean linen given.

## 2021-05-21 ENCOUNTER — Inpatient Hospital Stay (HOSPITAL_COMMUNITY): Payer: Medicare PPO

## 2021-05-21 ENCOUNTER — Encounter (HOSPITAL_COMMUNITY): Payer: Self-pay | Admitting: Family Medicine

## 2021-05-21 ENCOUNTER — Other Ambulatory Visit (HOSPITAL_COMMUNITY): Payer: Self-pay | Admitting: *Deleted

## 2021-05-21 ENCOUNTER — Other Ambulatory Visit: Payer: Self-pay

## 2021-05-21 DIAGNOSIS — B372 Candidiasis of skin and nail: Secondary | ICD-10-CM | POA: Diagnosis present

## 2021-05-21 DIAGNOSIS — I5030 Unspecified diastolic (congestive) heart failure: Secondary | ICD-10-CM | POA: Diagnosis present

## 2021-05-21 DIAGNOSIS — R011 Cardiac murmur, unspecified: Secondary | ICD-10-CM | POA: Diagnosis not present

## 2021-05-21 DIAGNOSIS — G9341 Metabolic encephalopathy: Secondary | ICD-10-CM | POA: Diagnosis present

## 2021-05-21 DIAGNOSIS — R531 Weakness: Secondary | ICD-10-CM

## 2021-05-21 LAB — COMPREHENSIVE METABOLIC PANEL
ALT: 12 U/L (ref 0–44)
AST: 16 U/L (ref 15–41)
Albumin: 2.9 g/dL — ABNORMAL LOW (ref 3.5–5.0)
Alkaline Phosphatase: 69 U/L (ref 38–126)
Anion gap: 6 (ref 5–15)
BUN: 29 mg/dL — ABNORMAL HIGH (ref 8–23)
CO2: 22 mmol/L (ref 22–32)
Calcium: 8.5 mg/dL — ABNORMAL LOW (ref 8.9–10.3)
Chloride: 112 mmol/L — ABNORMAL HIGH (ref 98–111)
Creatinine, Ser: 0.52 mg/dL (ref 0.44–1.00)
GFR, Estimated: >60 mL/min (ref >60)
Glucose, Bld: 103 mg/dL — ABNORMAL HIGH (ref 70–99)
Potassium: 3.3 mmol/L — ABNORMAL LOW (ref 3.5–5.1)
Sodium: 140 mmol/L (ref 135–145)
Total Bilirubin: 0.5 mg/dL (ref 0.3–1.2)
Total Protein: 5.6 g/dL — ABNORMAL LOW (ref 6.5–8.1)

## 2021-05-21 LAB — ECHOCARDIOGRAM COMPLETE
AV Mean grad: 23 mmHg
AV Peak grad: 41.5 mmHg
Ao pk vel: 3.22 m/s
Area-P 1/2: 2.22 cm2
Height: 66 in
S' Lateral: 2.1 cm
Weight: 2469.15 oz

## 2021-05-21 LAB — MAGNESIUM: Magnesium: 1.8 mg/dL (ref 1.7–2.4)

## 2021-05-21 LAB — CBC WITH DIFFERENTIAL/PLATELET
Abs Immature Granulocytes: 0.02 10*3/uL (ref 0.00–0.07)
Basophils Absolute: 0 10*3/uL (ref 0.0–0.1)
Basophils Relative: 1 %
Eosinophils Absolute: 0.1 10*3/uL (ref 0.0–0.5)
Eosinophils Relative: 2 %
HCT: 30.4 % — ABNORMAL LOW (ref 36.0–46.0)
Hemoglobin: 10 g/dL — ABNORMAL LOW (ref 12.0–15.0)
Immature Granulocytes: 0 %
Lymphocytes Relative: 15 %
Lymphs Abs: 0.9 10*3/uL (ref 0.7–4.0)
MCH: 33.7 pg (ref 26.0–34.0)
MCHC: 32.9 g/dL (ref 30.0–36.0)
MCV: 102.4 fL — ABNORMAL HIGH (ref 80.0–100.0)
Monocytes Absolute: 0.5 10*3/uL (ref 0.1–1.0)
Monocytes Relative: 8 %
Neutro Abs: 4.9 10*3/uL (ref 1.7–7.7)
Neutrophils Relative %: 74 %
Platelets: 152 10*3/uL (ref 150–400)
RBC: 2.97 MIL/uL — ABNORMAL LOW (ref 3.87–5.11)
RDW: 13.4 % (ref 11.5–15.5)
WBC: 6.5 10*3/uL (ref 4.0–10.5)
nRBC: 0 % (ref 0.0–0.2)

## 2021-05-21 LAB — TROPONIN I (HIGH SENSITIVITY): Troponin I (High Sensitivity): 8 ng/L (ref <18)

## 2021-05-21 LAB — LACTIC ACID, PLASMA
Lactic Acid, Venous: 0.7 mmol/L (ref 0.5–1.9)
Lactic Acid, Venous: 1.4 mmol/L (ref 0.5–1.9)

## 2021-05-21 MED ORDER — PANTOPRAZOLE SODIUM 40 MG PO TBEC
40.0000 mg | DELAYED_RELEASE_TABLET | Freq: Every day | ORAL | Status: DC
  Administered 2021-05-21 – 2021-05-23 (×3): 40 mg via ORAL
  Filled 2021-05-21 (×3): qty 1

## 2021-05-21 MED ORDER — FLUCONAZOLE 100 MG PO TABS
200.0000 mg | ORAL_TABLET | Freq: Every day | ORAL | Status: AC
  Administered 2021-05-21 – 2021-05-23 (×3): 200 mg via ORAL
  Filled 2021-05-21 (×3): qty 2

## 2021-05-21 MED ORDER — NYSTATIN-TRIAMCINOLONE 100000-0.1 UNIT/GM-% EX CREA
TOPICAL_CREAM | Freq: Two times a day (BID) | CUTANEOUS | Status: DC
  Filled 2021-05-21 (×3): qty 15

## 2021-05-21 MED ORDER — APIXABAN 5 MG PO TABS
5.0000 mg | ORAL_TABLET | Freq: Two times a day (BID) | ORAL | Status: DC
  Administered 2021-05-21 – 2021-05-23 (×6): 5 mg via ORAL
  Filled 2021-05-21 (×6): qty 1

## 2021-05-21 MED ORDER — ONDANSETRON HCL 4 MG/2ML IJ SOLN: 4.0000 mg | Freq: Four times a day (QID) | INTRAMUSCULAR | Status: DC | PRN

## 2021-05-21 MED ORDER — ONDANSETRON HCL 4 MG PO TABS: 4.0000 mg | ORAL_TABLET | Freq: Four times a day (QID) | ORAL | Status: DC | PRN

## 2021-05-21 MED ORDER — NYSTATIN 100000 UNIT/GM EX POWD
Freq: Three times a day (TID) | CUTANEOUS | Status: DC
  Filled 2021-05-21 (×2): qty 15

## 2021-05-21 MED ORDER — ACETAMINOPHEN 325 MG PO TABS: 650.0000 mg | ORAL_TABLET | Freq: Four times a day (QID) | ORAL | Status: DC | PRN

## 2021-05-21 MED ORDER — OXYCODONE HCL 5 MG PO TABS: 5.0000 mg | ORAL_TABLET | ORAL | Status: DC | PRN

## 2021-05-21 MED ORDER — AMLODIPINE BESYLATE 5 MG PO TABS
5.0000 mg | ORAL_TABLET | Freq: Every day | ORAL | Status: DC
  Administered 2021-05-21 – 2021-05-23 (×3): 5 mg via ORAL
  Filled 2021-05-21 (×3): qty 1

## 2021-05-21 MED ORDER — NYSTATIN-TRIAMCINOLONE 100000-0.1 UNIT/GM-% EX OINT
TOPICAL_OINTMENT | Freq: Two times a day (BID) | CUTANEOUS | Status: DC
  Filled 2021-05-21: qty 15

## 2021-05-21 MED ORDER — ACETAMINOPHEN 650 MG RE SUPP: 650.0000 mg | Freq: Four times a day (QID) | RECTAL | Status: DC | PRN

## 2021-05-21 MED ORDER — SODIUM CHLORIDE 0.9 % IV SOLN
1.0000 g | INTRAVENOUS | Status: DC
  Administered 2021-05-21 – 2021-05-22 (×2): 1 g via INTRAVENOUS
  Filled 2021-05-21 (×2): qty 10

## 2021-05-21 MED ORDER — SODIUM CHLORIDE 0.9 % IV SOLN: INTRAVENOUS | Status: AC

## 2021-05-21 NOTE — Plan of Care (Signed)
  Problem: Acute Rehab PT Goals(only PT should resolve) Goal: Pt Will Go Supine/Side To Sit Outcome: Progressing Flowsheets (Taken 05/21/2021 1118) Pt will go Supine/Side to Sit:  with minimal assist  with moderate assist Goal: Pt Will Go Sit To Supine/Side Outcome: Progressing Flowsheets (Taken 05/21/2021 1118) Pt will go Sit to Supine/Side:  with minimal assist  with moderate assist Goal: Patient Will Transfer Sit To/From Stand Outcome: Progressing Flowsheets (Taken 05/21/2021 1118) Patient will transfer sit to/from stand:  with minimal assist  with moderate assist Goal: Pt Will Transfer Bed To Chair/Chair To Bed Outcome: Progressing Flowsheets (Taken 05/21/2021 1118) Pt will Transfer Bed to Chair/Chair to Bed:  with min assist  with mod assist Goal: Pt Will Ambulate Outcome: Progressing Flowsheets (Taken 05/21/2021 1118) Pt will Ambulate:  10 feet  with rolling walker  with minimal assist  with moderate assist Goal: Pt/caregiver will Perform Home Exercise Program Outcome: Progressing Flowsheets (Taken 05/21/2021 1118) Pt/caregiver will Perform Home Exercise Program:  For increased strengthening  For improved balance  Independently  11:19 AM, 05/21/21 Mearl Latin PT, DPT Physical Therapist at [facility]

## 2021-05-21 NOTE — NC FL2 (Signed)
[place] LEVEL OF CARE SCREENING TOOL     IDENTIFICATION  Patient Name: Tammy Terry Birthdate: May 20, 1941 Sex: adult Admission Date (Current Location): 05/20/2021  [facility] and [place] Number:  [employer] and Address:  [place] [address], [place]      Provider Number: [id number]  Attending Physician Name and Address:  Roxan Hockey, MD  Relative Name and Phone Number:  Zhara, Gieske (Spouse)   [phone]    Current Level of Care: Hospital Recommended Level of Care: [place] Prior Approval Number:    Date Approved/Denied: 05/21/21 PASRR Number: [id number]  Discharge Plan: SNF    Current Diagnoses: Patient Active Problem List   Diagnosis Date Noted   Acute metabolic encephalopathy 94/76/5465   Yeast dermatitis 05/21/2021   Rt Leg DVT (deep venous thrombosis)/Femoral Vein and Popliteal Vein 02/02/2021   Pressure injury of skin 02/01/2021   Generalized weakness 01/31/2021   COVID-19 virus infection 01/31/2021   Acute lower UTI 01/31/2021   Age-related osteoporosis without current pathological fracture    Polyosteoarthritis, unspecified    Acquired absence of unspecified breast and nipple    Anxiety disorder, unspecified    Major depressive disorder, single episode, unspecified    Essential (primary) hypertension    Fatty (change of) liver, not elsewhere classified    Insomnia, unspecified    Localized edema    Malignant neoplasm of unspecified site of unspecified female breast ([place])    Other abnormalities of gait and mobility    Radiculopathy, site unspecified    Syncope and collapse    Tremor, unspecified    Unspecified convulsions ([place])    Unspecified fall, initial encounter    Cellulitis and abscess of left leg 04/01/2018   Cellulitis of left leg 04/01/2018   Osteoporosis    Abdominal distention 02/03/2015   Osteopenia 03/18/2013   Essential hypertension, benign 10/22/2012    Fatty liver 10/22/2012   Elevated liver enzymes 10/22/2012   Breast cancer, stage 3 ([place]) 03/01/2012   Depression with anxiety 03/01/2012    Orientation RESPIRATION BLADDER Height & Weight     Self, Time, Situation, Place  Normal Incontinent Weight: 154 lb 5.2 oz (70 kg) Height:  5\' 6"  (167.6 cm)  BEHAVIORAL SYMPTOMS/MOOD NEUROLOGICAL BOWEL NUTRITION STATUS      Continent Diet (Diet Heart Room service appropriate? Yes; Fluid consistency: Thin)  AMBULATORY STATUS COMMUNICATION OF NEEDS Skin   Extensive Assist Verbally Other (Comment) (Excoriated Abdomen, buttocks, perineum, thigh)                       Personal Care Assistance Level of Assistance  Bathing, Feeding, Dressing Bathing Assistance: Maximum assistance Feeding assistance: Limited assistance Dressing Assistance: Maximum assistance     Functional Limitations Info  Sight, Hearing, Speech Sight Info: Adequate Hearing Info: Adequate Speech Info: Adequate    SPECIAL CARE FACTORS FREQUENCY  PT (By licensed PT), OT (By licensed OT)     PT Frequency: 5x OT Frequency: 5x            Contractures Contractures Info: Not present    Additional Factors Info  Code Status, Allergies Code Status Info: Full Allergies Info: Amoxicillin, Codeine, Penicillins           Current Medications (05/21/2021):  This is the current hospital active medication list Current Facility-Administered Medications  Medication Dose Route Frequency Provider Last Rate Last Admin   acetaminophen (TYLENOL) tablet 650 mg  650 mg Oral Q6H  PRN Zierle-Ghosh, [place] B, DO       Or   acetaminophen (TYLENOL) suppository 650 mg  650 mg Rectal Q6H PRN Zierle-Ghosh, Asia B, DO       apixaban (ELIQUIS) tablet 5 mg  5 mg Oral BID Zierle-Ghosh, Asia B, DO   5 mg at 05/21/21 0815   cefTRIAXone (ROCEPHIN) 1 g in sodium chloride 0.9 % 100 mL IVPB  1 g Intravenous Q24H Zierle-Ghosh, Asia B, DO       nystatin (MYCOSTATIN/NYSTOP) topical powder   Topical  TID Zierle-Ghosh, Asia B, DO   Given at 05/21/21 0815   ondansetron (ZOFRAN) tablet 4 mg  4 mg Oral Q6H PRN Zierle-Ghosh, Asia B, DO       Or   ondansetron (ZOFRAN) injection 4 mg  4 mg Intravenous Q6H PRN Zierle-Ghosh, Asia B, DO       oxyCODONE (Oxy IR/ROXICODONE) immediate release tablet 5 mg  5 mg Oral Q4H PRN Zierle-Ghosh, Asia B, DO       pantoprazole (PROTONIX) EC tablet 40 mg  40 mg Oral Daily Zierle-Ghosh, Asia B, DO   40 mg at 05/21/21 0815     Discharge Medications: Please see discharge summary for a list of discharge medications.  Relevant Imaging Results:  Relevant Lab Results:   Additional Information PT SSN [id number]  Natasha Bence, LCSW

## 2021-05-21 NOTE — Progress Notes (Addendum)
Patient seen and evaluated, chart reviewed, please see EMR for updated orders. Please see full H&P dictated by admitting physician Dr. Clearence Ped for same date of service.    Brief Summary:- 80 y.o. adult, with history of osteoporosis, anxiety, cancer, essential hypertension, fatty liver, hyperlipidemia, hypertension, major depressive disorder, rosacea admitted on 05/21/2021 with weakness and inability to get out of a recliner for almost 10 days--  Problem  (HFpEF) heart failure with preserved ejection fraction/chronic Diastolic CHF/ EF 24-58%, ??  Outflow obstruction  Generalized Weakness  Acute Metabolic Encephalopathy  Yeast Dermatitis  Acute Lower Uti  Essential (Primary) Hypertension     A/p 1)HFpEF--- chronic diastolic dysfunction CHF,  --Echo with EF of 60 to 09%, grade 1 diastolic dysfunction, and concern for outflow obstruction--- Severe hypertrophy of the basal septum with otherwise moderate concentric hypertrophy. Basal septum seems to be causing outflow tract  Obstruction -Cardiology consult requested due to outflow obstruction concerns   2) acute metabolic cephalopathy--- suspect UTI related, -Continue IV Rocephin pending urine culture data  3)H/o VTE/DVT--continue Eliquis  4) candidal intertrigo--pretty extensive groin, perineal and overall skin intertrigo/yeast infection, --give Diflucan and Mycolog as prescribed  5)HTN-restart amlodipine  6) generalized weakness and ambulatory dysfunction/history of falls ---  PT eval appreciated, recommends SNF rehab -PTA pt lived alone and did very poorly, patient has significant limitations with mobility related ADLs- this patient needs to continue to be monitored in the hospital until a SNF bed is obtained as she is not safe to go home with her current physcical limitations   Patient seen and evaluated, chart reviewed, please see EMR for updated orders. Please see full H&P dictated by admitting physician Dr. Clearence Ped for  same date of service.   Total care time is 43 minutes  Roxan Hockey, MD

## 2021-05-21 NOTE — Progress Notes (Signed)
*  PRELIMINARY RESULTS* Echocardiogram 2D Echocardiogram has been performed.  Tammy Terry 05/21/2021, 3:52 PM

## 2021-05-21 NOTE — H&P (Signed)
TRH H&P    Patient Demographics:    Tammy Terry, is a 80 y.o. adult  MRN: [id number]  DOB - 03-27-1941  Admit Date - 05/20/2021  Referring MD/NP/PA: Vanessa [place]  Outpatient Primary MD for the patient is Celene Squibb, MD  Patient coming from: Home  Chief complaint- generalized weakness   HPI:    Tammy Terry  is a 80 y.o. adult, with history of osteoporosis, anxiety, cancer, essential hypertension, fatty liver, hyperlipidemia, hypertension, major depressive disorder, rosacea, and more presents ED with a chief complaint of generalized weakness.  At first patient reports that she cannot remember why she came to the ED today.  I prompted her that it was reported that she cannot get up from her recliner chair, and then she said oh yes I have been feeling weak.  Looks like earlier in the evening she told people's because of right leg pain.  It is unclear if her mentation is at baseline or not but we do know that sitting in the recliner chair for 10 days is abnormal for her so is reasonable to believe that her mentation is altered as well.  Is reported the patient was sitting in her recliner chair for 10 days.  She could not stand or walk.  When she came in the ED she was covered in urine and feces and had skin breakdown and excoriations.  ED provider spoke with the spouse who states that his health is declining and he is not able to take care of her at home anymore.  He would prefer for her to have placement.  Patient reports that she is not currently in any pain.  She has no other complaints at this time.  Patient does not smoke, does not drink, does not use illicit drugs.  She is not sure if she had a COVID shot.  She is oriented to herself place and time, although she cannot remember why she is here.  In the ED Temp 98.3, heart rate 72-1 02, respiratory 18-19, blood pressure 120/52, satting 100% on room air No  leukocytosis, hemoglobin is stable Chemistry panel is unremarkable UA is indicative of UTI, urine culture pending, Rocephin given Chest x-ray shows no active disease X-ray foot shows limited osteopenia with questionable fracture deformity at the neck of the fifth phalanx Patient had Tylenol, 500 mL bolus Admission was requested replacement number further management of UTI and altered mental status    Review of systems:    Unfortunately review of systems could not be obtained secondary to altered mental status    Past History of the following :    Past Medical History:  Diagnosis Date   Acquired absence of unspecified breast and nipple    Age-related osteoporosis without current pathological fracture    Age-related osteoporosis without current pathological fracture    Anxiety    Anxiety disorder, unspecified    Cancer ([place]) 1994   BREAST CANCER, TREATED WITH RADIATION AND CHEMOTHERAPY   Depression with anxiety 03/01/2012   Essential (primary) hypertension    Fatty (change of)  liver, not elsewhere classified    Fatty liver    H/O vitamin D deficiency    Hyperlipidemia 03/01/2012   Hypertension    Insomnia, unspecified    Localized edema    Lumbago    Major depressive disorder, single episode, unspecified    Malignant neoplasm of unspecified site of unspecified female breast ([place])    Osteoporosis    Other abnormalities of gait and mobility    Polyosteoarthritis, unspecified    Radiculopathy, site unspecified    Rosacea    Syncope and collapse    Tremor, unspecified    Unspecified convulsions ([place])    Unspecified fall, initial encounter       Past Surgical History:  Procedure Laterality Date   [place]   LEFT MASTECTOMY    CAPSULOTOMY  august 2015   of left eye   CATARACT EXTRACTION Left    CESAREAN SECTION     COLONOSCOPY N/A 05/13/2014   Procedure: COLONOSCOPY;  Surgeon: Rogene Houston, MD;  Location: [facility];  Service: Endoscopy;  Laterality:  N/A;  43   MASTECTOMY     left breast for cancer in 1994   TONSILLECTOMY        Social History:      Social History   Tobacco Use   Smoking status: Never   Smokeless tobacco: Never  Substance Use Topics   Alcohol use: No    Alcohol/week: 0.0 standard drinks       Family History :     Family History  Problem Relation Age of Onset   Heart attack Father    Diabetes Paternal Grandmother    Unfortunately family history could not be reviewed secondary to altered mental status   Home Medications:   Prior to Admission medications   Medication Sig Start Date End Date Taking? Authorizing Provider  acetaminophen (TYLENOL) 325 MG tablet Take 650 mg by mouth every 6 (six) hours as needed for headache.    [provider]  amLODipine (NORVASC) 2.5 MG tablet Take 1 tablet (2.5 mg total) by mouth daily. FOR BP 02/03/21 02/03/22  Roxan Hockey, MD  apixaban (ELIQUIS) 5 MG TABS tablet Take 1 tablet (5 mg total) by mouth 2 (two) times daily. 03/03/21   Roxan Hockey, MD  Apixaban Starter Pack, 10mg  and 5mg , (ELIQUIS DVT/PE STARTER PACK) Take as directed on package: start with two-5mg  tablets twice daily for 7 days. On day 8, switch to one-5mg  tablet twice daily. 02/03/21   Roxan Hockey, MD  ascorbic acid (VITAMIN C) 500 MG tablet Take 1 tablet (500 mg total) by mouth daily. Patient not taking: Reported on 05/20/2021 02/04/21   Roxan Hockey, MD  ketoconazole (NIZORAL) 2 % shampoo Apply topically. Patient not taking: Reported on 05/20/2021 04/21/21   [provider]  pantoprazole (PROTONIX) 40 MG tablet Take 1 tablet (40 mg total) by mouth daily. Patient not taking: Reported on 05/20/2021 02/03/21 02/03/22  Roxan Hockey, MD  zinc sulfate 220 (50 Zn) MG capsule Take 1 capsule (220 mg total) by mouth daily. Patient not taking: Reported on 05/20/2021 02/04/21   Roxan Hockey, MD     Allergies:     Allergies  Allergen Reactions   Amoxicillin Other (See  Comments)    Unknown reaction   Codeine     Unknown reaction   Penicillins     Long time ago . But no anaphylaxis     Physical Exam:   Vitals  Blood pressure 136/61, pulse 91, temperature (!)  97.4 F (36.3 C), temperature source Oral, resp. rate 17, height 5\' 6"  (1.676 m), weight 70 kg, SpO2 100 %.   1.  General: Patient lying supine in bed,  no acute distress   2. Psychiatric: Alert and oriented x 3, mood and behavior normal for situation, pleasant and cooperative with exam   3. Neurologic: Speech and language are normal, face is symmetric, moves all 4 extremities voluntarily, at baseline without acute deficits on limited exam   4. HEENMT:  Head is atraumatic, rosacea with excoriations bilateral face, normocephalic, pupils reactive to light, neck is supple, trachea is midline, mucous membranes are moist   5. Respiratory : Lungs are clear to auscultation bilaterally without wheezing, rhonchi, rales, no cyanosis, no increase in work of breathing or accessory muscle use   6. Cardiovascular : Heart rate normal, rhythm is regular, systolic murmur present, rubs or gallops, no peripheral edema, peripheral pulses palpated   7. Gastrointestinal:  Abdomen is soft, nondistended, nontender to palpation bowel sounds active, no masses or organomegaly palpated   8. Skin:  Skin is warm, creases are moist with excoriations and erythema, early skin breakdown vaginal and sacral areas  9.Musculoskeletal:  No acute deformities or trauma, no asymmetry in tone, no peripheral edema, peripheral pulses palpated, no tenderness to palpation in the extremities     Data Review:    CBC Recent Labs  Lab 05/20/21 1717  WBC 9.3  HGB 12.6  HCT 39.4  PLT 186  MCV 103.4*  MCH 33.1  MCHC 32.0  RDW 13.5  LYMPHSABS 0.9  MONOABS 0.6  EOSABS 0.1  BASOSABS 0.0   ------------------------------------------------------------------------------------------------------------------  Results for  orders placed or performed during the hospital encounter of 05/20/21 (from the past 48 hour(s))  CBC with Differential/Platelet     Status: Abnormal   Collection Time: 05/20/21  5:17 PM  Result Value Ref Range   WBC 9.3 4.0 - 10.5 K/uL   RBC 3.81 (L) 3.87 - 5.11 MIL/uL   Hemoglobin 12.6 12.0 - 15.0 g/dL   HCT 39.4 36.0 - 46.0 %   MCV 103.4 (H) 80.0 - 100.0 fL   MCH 33.1 26.0 - 34.0 pg   MCHC 32.0 30.0 - 36.0 g/dL   RDW 13.5 11.5 - 15.5 %   Platelets 186 150 - 400 K/uL   nRBC 0.0 0.0 - 0.2 %   Neutrophils Relative % 83 %   Neutro Abs 7.7 1.7 - 7.7 K/uL   Lymphocytes Relative 10 %   Lymphs Abs 0.9 0.7 - 4.0 K/uL   Monocytes Relative 6 %   Monocytes Absolute 0.6 0.1 - 1.0 K/uL   Eosinophils Relative 1 %   Eosinophils Absolute 0.1 0.0 - 0.5 K/uL   Basophils Relative 0 %   Basophils Absolute 0.0 0.0 - 0.1 K/uL   Immature Granulocytes 0 %   Abs Immature Granulocytes 0.03 0.00 - 0.07 K/uL    Comment: Performed at [facility], [address], [place], [place] [address]  Comprehensive metabolic panel     Status: Abnormal   Collection Time: 05/20/21  5:17 PM  Result Value Ref Range   Sodium 140 135 - 145 mmol/L   Potassium 3.7 3.5 - 5.1 mmol/L   Chloride 109 98 - 111 mmol/L   CO2 23 22 - 32 mmol/L   Glucose, Bld 102 (H) 70 - 99 mg/dL    Comment: Glucose reference range applies only to samples taken after fasting for at least 8 hours.   BUN 36 (H)  8 - 23 mg/dL   Creatinine, Ser 0.76 0.44 - 1.00 mg/dL   Calcium 9.4 8.9 - 10.3 mg/dL   Total Protein 7.4 6.5 - 8.1 g/dL   Albumin 3.9 3.5 - 5.0 g/dL   AST 19 15 - 41 U/L   ALT 15 0 - 44 U/L   Alkaline Phosphatase 94 38 - 126 U/L   Total Bilirubin 0.6 0.3 - 1.2 mg/dL   GFR, Estimated >60 >60 mL/min    Comment: (NOTE) Calculated using the CKD-EPI Creatinine Equation (2021)    Anion gap 8 5 - 15    Comment: Performed at [facility], [address], [place], [place] [address]  Urinalysis, Routine w reflex microscopic Urine, Clean Catch      Status: Abnormal   Collection Time: 05/20/21  6:34 PM  Result Value Ref Range   Color, Urine YELLOW YELLOW   APPearance HAZY (A) CLEAR   Specific Gravity, Urine >1.030 (H) 1.005 - 1.030   pH 5.5 5.0 - 8.0   Glucose, UA NEGATIVE NEGATIVE mg/dL   Hgb urine dipstick SMALL (A) NEGATIVE   Bilirubin Urine NEGATIVE NEGATIVE   Ketones, ur NEGATIVE NEGATIVE mg/dL   Protein, ur NEGATIVE NEGATIVE mg/dL   Nitrite NEGATIVE NEGATIVE   Leukocytes,Ua MODERATE (A) NEGATIVE    Comment: Performed at [facility], [address], [place], [place] [address]  Urinalysis, Microscopic (reflex)     Status: Abnormal   Collection Time: 05/20/21  6:34 PM  Result Value Ref Range   RBC / HPF 6-10 0 - 5 RBC/hpf   WBC, UA 11-20 0 - 5 WBC/hpf   Bacteria, UA MANY (A) NONE SEEN   Squamous Epithelial / LPF 11-20 0 - 5   Ca Oxalate Crys, UA PRESENT     Comment: Performed at [facility], [address], [place], [place] [address]  Resp Panel by RT-PCR (Flu A&B, Covid) Nasopharyngeal Swab     Status: None   Collection Time: 05/20/21 10:20 PM   Specimen: Nasopharyngeal Swab; Nasopharyngeal(NP) swabs in vial transport medium  Result Value Ref Range   SARS Coronavirus 2 by RT PCR NEGATIVE NEGATIVE    Comment: (NOTE) SARS-CoV-2 target nucleic acids are NOT DETECTED.  The SARS-CoV-2 RNA is generally detectable in upper respiratory specimens during the acute phase of infection. The lowest concentration of SARS-CoV-2 viral copies this assay can detect is 138 copies/mL. A negative result does not preclude SARS-Cov-2 infection and should not be used as the sole basis for treatment or other patient management decisions. A negative result may occur with  improper specimen collection/handling, submission of specimen other than nasopharyngeal swab, presence of viral mutation(s) within the areas targeted by this assay, and inadequate number of viral copies(<138 copies/mL). A negative result must be combined with clinical  observations, patient history, and epidemiological information. The expected result is Negative.  Fact Sheet for Patients:  EntrepreneurPulse.com.au  Fact Sheet for Healthcare Providers:  IncredibleEmployment.be  This test is no t yet approved or cleared by the [place] FDA and  has been authorized for detection and/or diagnosis of SARS-CoV-2 by FDA under an Emergency Use Authorization (EUA). This EUA will remain  in effect (meaning this test can be used) for the duration of the COVID-19 declaration under Section 564(b)(1) of the Act, 21 U.S.C.section 360bbb-3(b)(1), unless the authorization is terminated  or revoked sooner.       Influenza A by PCR NEGATIVE NEGATIVE   Influenza B by PCR NEGATIVE NEGATIVE    Comment: (NOTE)  The Xpert Xpress SARS-CoV-2/FLU/RSV plus assay is intended as an aid in the diagnosis of influenza from Nasopharyngeal swab specimens and should not be used as a sole basis for treatment. Nasal washings and aspirates are unacceptable for Xpert Xpress SARS-CoV-2/FLU/RSV testing.  Fact Sheet for Patients: EntrepreneurPulse.com.au  Fact Sheet for Healthcare Providers: IncredibleEmployment.be  This test is not yet approved or cleared by the [place] FDA and has been authorized for detection and/or diagnosis of SARS-CoV-2 by FDA under an Emergency Use Authorization (EUA). This EUA will remain in effect (meaning this test can be used) for the duration of the COVID-19 declaration under Section 564(b)(1) of the Act, 21 U.S.C. section 360bbb-3(b)(1), unless the authorization is terminated or revoked.  Performed at [facility], [address] [place], [place] [address]   Lactic acid, plasma     Status: None   Collection Time: 05/20/21 11:30 PM  Result Value Ref Range   Lactic Acid, Venous 1.4 0.5 - 1.9 mmol/L    Comment: Performed at [facility], [address] [place],   [address]    Chemistries  Recent Labs  Lab 05/20/21 1717  NA 140  K 3.7  CL 109  CO2 23  GLUCOSE 102*  BUN 36*  CREATININE 0.76  CALCIUM 9.4  AST 19  ALT 15  ALKPHOS 94  BILITOT 0.6   ------------------------------------------------------------------------------------------------------------------  ------------------------------------------------------------------------------------------------------------------ GFR: Estimated Creatinine Clearance (by C-G formula based on SCr of 0.76 mg/dL) Female: 50.8 mL/min Female: 64.2 mL/min Liver Function Tests: Recent Labs  Lab 05/20/21 1717  AST 19  ALT 15  ALKPHOS 94  BILITOT 0.6  PROT 7.4  ALBUMIN 3.9   No results for input(s): LIPASE, AMYLASE in the last 168 hours. No results for input(s): AMMONIA in the last 168 hours. Coagulation Profile: No results for input(s): INR, PROTIME in the last 168 hours. Cardiac Enzymes: No results for input(s): CKTOTAL, CKMB, CKMBINDEX, TROPONINI in the last 168 hours. BNP (last 3 results) No results for input(s): PROBNP in the last 8760 hours. HbA1C: No results for input(s): HGBA1C in the last 72 hours. CBG: No results for input(s): GLUCAP in the last 168 hours. Lipid Profile: No results for input(s): CHOL, HDL, LDLCALC, TRIG, CHOLHDL, LDLDIRECT in the last 72 hours. Thyroid Function Tests: No results for input(s): TSH, T4TOTAL, FREET4, T3FREE, THYROIDAB in the last 72 hours. Anemia Panel: No results for input(s): VITAMINB12, FOLATE, FERRITIN, TIBC, IRON, RETICCTPCT in the last 72 hours.  --------------------------------------------------------------------------------------------------------------- Urine analysis:    Component Value Date/Time   COLORURINE YELLOW 05/20/2021 1834   APPEARANCEUR HAZY (A) 05/20/2021 1834   LABSPEC >1.030 (H) 05/20/2021 1834   PHURINE 5.5 05/20/2021 1834   GLUCOSEU NEGATIVE 05/20/2021 1834   HGBUR SMALL (A) 05/20/2021 1834   BILIRUBINUR NEGATIVE  05/20/2021 1834   KETONESUR NEGATIVE 05/20/2021 1834   PROTEINUR NEGATIVE 05/20/2021 1834   NITRITE NEGATIVE 05/20/2021 1834   LEUKOCYTESUR MODERATE (A) 05/20/2021 1834      Imaging Results:    DG Chest 2 View  Result Date: 05/20/2021 CLINICAL DATA:  Pain recent fall EXAM: CHEST - 2 VIEW COMPARISON:  01/31/2021 FINDINGS: The heart size and mediastinal contours are within normal limits. Both lungs are clear. The visualized skeletal structures are unremarkable. Surgical changes at the left axilla. Aortic atherosclerosis. Chronic appearing right-sided rib fractures. IMPRESSION: No active cardiopulmonary disease. Electronically Signed   By: Donavan Foil M.D.   On: 05/20/2021 18:36   DG Foot 2 Views Right  Result Date: 05/20/2021 CLINICAL DATA:  Foot pain recent fall EXAM: RIGHT FOOT - 2 VIEW COMPARISON:  01/24/2016 FINDINGS: Bones appear osteopenic which limits the exam. No malalignment. Degenerative changes at the first MTP joint. Questionable fracture deformity at the neck of the fifth proximal phalanx on oblique view. IMPRESSION: Limited by osteopenia. Questionable fracture deformity at the neck of fifth proximal phalanx, correlate for point tenderness Electronically Signed   By: Donavan Foil M.D.   On: 05/20/2021 18:34       Assessment & Plan:    Principal Problem:   Generalized weakness Active Problems:   Essential (primary) hypertension   Acute lower UTI   Pressure injury of skin   Acute metabolic encephalopathy   Yeast dermatitis   Generalized weakness Likely acute on chronic with general deconditioning secondary to what seems like undiagnosed dementia, and related to UTI Consult PT eval and treat Consult transition of care as husband is interested in placement Check a troponin and EKG No focal deficit Continue to monitor Acute metabolic encephalopathy Likely related to UTI Also likely underlying undiagnosed dementia Treat UTI as below Continue to  monitor UTI Continue Rocephin Last culture, 3 months ago, shows pansensitive E. Coli Awaiting updated culture results Continue to monitor Essential hypertension BP well controlled in the ED Holding 2.5 mg amlodipine Pressure injury of the skin-present on admission Consult wound care Continue to monitor Yeast dermatitis Apply nystatin powder to creases History of DVT Continue Eliquis Murmur Echo in 2019 showed aortic regurgitation Update echo   DVT Prophylaxis-   Eliquis- SCDs   AM Labs Ordered, also please review Full Orders  Family Communication: No family at bedside  Code Status: Full  Admission status: Inpatient :The appropriate admission status for this patient is INPATIENT. Inpatient status is judged to be reasonable and necessary in order to provide the required intensity of service to ensure the patient's safety. The patient's presenting symptoms, physical exam findings, and initial radiographic and laboratory data in the context of their chronic comorbidities is felt to place them at high risk for further clinical deterioration. Furthermore, it is not anticipated that the patient will be medically stable for discharge from the hospital within 2 midnights of admission. The following factors support the admission status of inpatient.     The patient's presenting symptoms include generalized weakness. The worrisome physical exam findings include murmur. The initial radiographic and laboratory data are worrisome because of UTI. The chronic co-morbidities include hypertension, hyperlipidemia, and depression.       * I certify that at the point of admission it is my clinical judgment that the patient will require inpatient hospital care spanning beyond 2 midnights from the point of admission due to high intensity of service, high risk for further deterioration and high frequency of surveillance required.*  Disposition: Anticipated Discharge date 48 hours discharge to  SNF  Time spent in minutes : [place] DO

## 2021-05-21 NOTE — Evaluation (Signed)
Physical Therapy Evaluation Patient Details Name: Tammy Terry MRN: [id number] DOB: [birth year] Today's Date: 05/21/2021  History of Present Illness  Tammy Terry  is a 80 y.o. adult, with history of osteoporosis, anxiety, cancer, essential hypertension, fatty liver, hyperlipidemia, hypertension, major depressive disorder, rosacea, and more presents ED with a chief complaint of generalized weakness.  At first patient reports that she cannot remember why she came to the ED today.  I prompted her that it was reported that she cannot get up from her recliner chair, and then she said oh yes I have been feeling weak.  Looks like earlier in the evening she told people's because of right leg pain.  It is unclear if her mentation is at baseline or not but we do know that sitting in the recliner chair for 10 days is abnormal for her so is reasonable to believe that her mentation is altered as well.  Is reported the patient was sitting in her recliner chair for 10 days.  She could not stand or walk.  When she came in the ED she was covered in urine and feces and had skin breakdown and excoriations.  ED provider spoke with the spouse who states that his health is declining and he is not able to take care of her at home anymore.  He would prefer for her to have placement.  Patient reports that she is not currently in any pain.  She has no other complaints at this time.   Clinical Impression  Patient limited for functional mobility as stated below secondary to BLE weakness, fatigue and poor standing balance. Patient requires assist and cueing with all mobility due to severe weakness. She is apprehensive for standing mobility secondary to fear of falling which improves with assist and frequent cueing for sequencing. Patient able to perform several sit to stands with assist, cueing, and RW and transfer to [facility]. Patient limited to a few steps at bedside and is returned to bed at end of session. Patient with poor standing  tolerance and balance. Patient will benefit from continued physical therapy in hospital and recommended venue below to increase strength, balance, endurance for safe ADLs and gait.        Recommendations for follow up therapy are one component of a multi-disciplinary discharge planning process, led by the attending physician.  Recommendations may be updated based on patient status, additional functional criteria and insurance authorization.  Follow Up Recommendations Skilled nursing-short term rehab (<3 hours/day)    Assistance Recommended at Discharge Frequent or constant Supervision/Assistance  Functional Status Assessment Patient has had a recent decline in their functional status and demonstrates the ability to make significant improvements in function in a reasonable and predictable amount of time.  Equipment Recommendations  None recommended by PT    Recommendations for Other Services       Precautions / Restrictions Precautions Precautions: Fall Restrictions Weight Bearing Restrictions: No      Mobility  Bed Mobility Overal bed mobility: Needs Assistance Bed Mobility: Supine to Sit;Sit to Supine     Supine to sit: Mod assist;HOB elevated Sit to supine: Mod assist   General bed mobility comments: slow, labored, assist for weakness, cueing for sequencing    Transfers Overall transfer level: Needs assistance Equipment used: Rolling walker (2 wheels) Transfers: Sit to/from Stand;Bed to chair/wheelchair/BSC Sit to Stand: Mod assist;Max assist Stand pivot transfers: Mod assist;Max assist         General transfer comment: cueing for sequencing, proper RW use, assist  for weakness; patient anxious and requires frequent reassurance and cueing    Ambulation/Gait Ambulation/Gait assistance: Mod assist;Max assist Gait Distance (Feet): 2 Feet Assistive device: Rolling walker (2 wheels) Gait Pattern/deviations: Shuffle       General Gait Details: labored unsteady  steps to [facility]  Stairs            Wheelchair Mobility    Modified Rankin (Stroke Patients Only)       Balance Overall balance assessment: Needs assistance   Sitting balance-Leahy Scale: Good Sitting balance - Comments: seated EOB   Standing balance support: Bilateral upper extremity supported Standing balance-Leahy Scale: Poor Standing balance comment: with RW                             Pertinent Vitals/Pain Pain Assessment: No/denies pain    Home Living Family/patient expects to be discharged to:: Private residence Living Arrangements: Spouse/significant other Available Help at Discharge: Family Type of Home: House Home Access: Ramped entrance       Home Layout: One level Home Equipment: Conservation officer, nature (2 wheels);Cane - single point      Prior Function Prior Level of Function : Needs assist             Mobility Comments: household ambulation ADLs Comments: husband assists     Hand Dominance        Extremity/Trunk Assessment   Upper Extremity Assessment Upper Extremity Assessment: Generalized weakness    Lower Extremity Assessment Lower Extremity Assessment: Generalized weakness    Cervical / Trunk Assessment Cervical / Trunk Assessment: Kyphotic  Communication   Communication: No difficulties  Cognition Arousal/Alertness: Awake/alert Behavior During Therapy: WFL for tasks assessed/performed;Anxious Overall Cognitive Status: Within Functional Limits for tasks assessed                                          General Comments      Exercises     Assessment/Plan    PT Assessment Patient needs continued PT services  PT Problem List Decreased strength;Decreased mobility;Decreased activity tolerance;Decreased balance;Decreased knowledge of use of DME       PT Treatment Interventions DME instruction;Therapeutic exercise;Gait training;Balance training;Stair training;Neuromuscular re-education;Functional  mobility training;Therapeutic activities;Patient/family education    PT Goals (Current goals can be found in the Care Plan section)  Acute Rehab PT Goals Patient Stated Goal: Return home PT Goal Formulation: With patient Time For Goal Achievement: 06/04/21 Potential to Achieve Goals: Fair    Frequency Min 3X/week   Barriers to discharge        Co-evaluation               AM-PAC PT "6 Clicks" Mobility  Outcome Measure Help needed turning from your back to your side while in a flat bed without using bedrails?: A Little Help needed moving from lying on your back to sitting on the side of a flat bed without using bedrails?: A Lot Help needed moving to and from a bed to a chair (including a wheelchair)?: A Lot Help needed standing up from a chair using your arms (e.g., wheelchair or bedside chair)?: A Lot Help needed to walk in hospital room?: Total Help needed climbing 3-5 steps with a railing? : Total 6 Click Score: 11    End of Session Equipment Utilized During Treatment: Gait belt Activity Tolerance: Patient limited by fatigue  Patient left: in bed;with call bell/phone within reach;with bed alarm set Nurse Communication: Mobility status PT Visit Diagnosis: Unsteadiness on feet (R26.81);Other abnormalities of gait and mobility (R26.89);Muscle weakness (generalized) (M62.81)    Time: 1018-1050 PT Time Calculation (min) (ACUTE ONLY): 32 min   Charges:   PT Evaluation $PT Eval Low Complexity: 1 Low PT Treatments $Therapeutic Activity: 23-37 mins        11:17 AM, 05/21/21 Mearl Latin PT, DPT Physical Therapist at [facility]

## 2021-05-21 NOTE — TOC Initial Note (Addendum)
Transition of Care [facility]) - Initial/Assessment Note    Patient Details  Name: Tammy Terry MRN: [id number] Date of Birth: 03-16-1941  [facility] CM/SW Contact:    Natasha Bence, LCSW Phone Number: 05/21/2021, 4:03 PM  Clinical Narrative:                 Patient is an 80 year old female admitted for Acute metabolic encephalopathy. CSW conducted initial assessment. Patient's husband reported that patient was ambulatory with walker at her baseline, but has not been able to ambulate for the past week. Patient is vaccinated for Covid 19 with 1 booster. Patient's husband agreeable to SNF referrals in [place] but expressed preference for [facility]. CSW completed FL2 and faxed out patient.   Expected Discharge Plan: Skilled Nursing Facility Barriers to Discharge: Barriers Resolved   Patient Goals and CMS Choice Patient states their goals for this hospitalization and ongoing recovery are:: rehab with SNF CMS Medicare.gov Compare Post Acute Care list provided to:: Patient Choice offered to / list presented to : Patient  Expected Discharge Plan and Services Expected Discharge Plan: [place]                                              Prior Living Arrangements/Services   Lives with:: Self, Spouse Patient language and need for interpreter reviewed:: Yes Do you feel safe going back to the place where you live?: Yes      Need for Family Participation in Patient Care: Yes (Comment) Care giver support system in place?: Yes (comment)   Criminal Activity/Legal Involvement Pertinent to Current Situation/Hospitalization: No - Comment as needed  Activities of Daily Living Home Assistive Devices/Equipment: Walker (specify type) ADL Screening (condition at time of admission) Patient's cognitive ability adequate to safely complete daily activities?: No Is the patient deaf or have difficulty hearing?: No Does the patient have difficulty seeing, even  when wearing glasses/contacts?: No Patient able to express need for assistance with ADLs?: Yes Does the patient have difficulty dressing or bathing?: Yes Independently performs ADLs?: No Communication: Needs assistance Is this a change from baseline?: Pre-admission baseline Dressing (OT): Needs assistance Is this a change from baseline?: Pre-admission baseline Grooming: Needs assistance Is this a change from baseline?: Pre-admission baseline Feeding: Independent Bathing: Needs assistance Is this a change from baseline?: Pre-admission baseline Toileting: Needs assistance Is this a change from baseline?: Pre-admission baseline In/Out Bed: Needs assistance Is this a change from baseline?: Pre-admission baseline Walks in Home: Needs assistance Is this a change from baseline?: Pre-admission baseline Does the patient have difficulty walking or climbing stairs?: Yes Weakness of Legs: Both Weakness of Arms/Hands: None  Permission Sought/Granted Permission sought to share information with : Family Supports Permission granted to share information with : Yes, Verbal Permission Granted  Share Information with NAME: Hermida,Robert  Permission granted to share info w AGENCY: Local SNF's  Permission granted to share info w Relationship: (Spouse)  Permission granted to share info w Contact Information: [phone]  Emotional Assessment     Affect (typically observed): Accepting, Adaptable Orientation: : Oriented to Self, Oriented to Situation, Oriented to Place, Oriented to  Time Alcohol / Substance Use: Not Applicable Psych Involvement: No (comment)  Admission diagnosis:  Generalized weakness [R53.1] Patient Active Problem List   Diagnosis Date Noted   Acute metabolic encephalopathy 21/30/8657   Yeast  dermatitis 05/21/2021   Rt Leg DVT (deep venous thrombosis)/Femoral Vein and Popliteal Vein 02/02/2021   Pressure injury of skin 02/01/2021   Generalized weakness 01/31/2021   COVID-19  virus infection 01/31/2021   Acute lower UTI 01/31/2021   Age-related osteoporosis without current pathological fracture    Polyosteoarthritis, unspecified    Acquired absence of unspecified breast and nipple    Anxiety disorder, unspecified    Major depressive disorder, single episode, unspecified    Essential (primary) hypertension    Fatty (change of) liver, not elsewhere classified    Insomnia, unspecified    Localized edema    Malignant neoplasm of unspecified site of unspecified female breast ([place])    Other abnormalities of gait and mobility    Radiculopathy, site unspecified    Syncope and collapse    Tremor, unspecified    Unspecified convulsions ([place])    Unspecified fall, initial encounter    Cellulitis and abscess of left leg 04/01/2018   Cellulitis of left leg 04/01/2018   Osteoporosis    Abdominal distention 02/03/2015   Osteopenia 03/18/2013   Essential hypertension, benign 10/22/2012   Fatty liver 10/22/2012   Elevated liver enzymes 10/22/2012   Breast cancer, stage 3 ([place]) 03/01/2012   Depression with anxiety 03/01/2012   PCP:  Celene Squibb, MD Pharmacy:   [facility] [address] [place] [address] Phone: [phone] Fax: [phone]     Social Determinants of Health (SDOH) Interventions    Readmission Risk Interventions No flowsheet data found.

## 2021-05-22 DIAGNOSIS — I5032 Chronic diastolic (congestive) heart failure: Secondary | ICD-10-CM | POA: Diagnosis not present

## 2021-05-22 DIAGNOSIS — N390 Urinary tract infection, site not specified: Principal | ICD-10-CM

## 2021-05-22 DIAGNOSIS — I10 Essential (primary) hypertension: Secondary | ICD-10-CM

## 2021-05-22 DIAGNOSIS — G9341 Metabolic encephalopathy: Secondary | ICD-10-CM | POA: Diagnosis not present

## 2021-05-22 DIAGNOSIS — R531 Weakness: Secondary | ICD-10-CM | POA: Diagnosis not present

## 2021-05-22 LAB — BASIC METABOLIC PANEL
Anion gap: 4 — ABNORMAL LOW (ref 5–15)
BUN: 21 mg/dL (ref 8–23)
CO2: 23 mmol/L (ref 22–32)
Calcium: 8.4 mg/dL — ABNORMAL LOW (ref 8.9–10.3)
Chloride: 111 mmol/L (ref 98–111)
Creatinine, Ser: 0.49 mg/dL (ref 0.44–1.00)
GFR, Estimated: >60 mL/min (ref >60)
Glucose, Bld: 95 mg/dL (ref 70–99)
Potassium: 3.7 mmol/L (ref 3.5–5.1)
Sodium: 138 mmol/L (ref 135–145)

## 2021-05-22 NOTE — Progress Notes (Addendum)
PROGRESS NOTE   Tammy Terry  EHU:[id number] DOB: 09/29/1940 DOA: 05/20/2021 PCP: Celene Squibb, MD   Chief Complaint  Patient presents with   Leg Pain   Level of care: Med-Surg  Brief Admission History:  80 y.o. adult, with history of osteoporosis, anxiety, cancer, essential hypertension, fatty liver, hyperlipidemia, hypertension, major depressive disorder, rosacea, and more presents ED with a chief complaint of generalized weakness.  She ws basically found to be immobile and found at home covered in urine and feces.  Family requesting SNF placement.   Assessment & Plan:   Principal Problem:   (HFpEF) heart failure with preserved ejection fraction/chronic Diastolic CHF/ EF 78-58%, ??  Outflow obstruction Active Problems:   Essential (primary) hypertension   Generalized weakness   Acute lower UTI   Pressure injury of skin   Acute metabolic encephalopathy   Yeast dermatitis  Acute metabolic encephalopathy - secondary to UTI  - treating UTI and continue supportive measures   Generalized weakness  - multifactorial  - PT recommending need for SNF  Pressure injury of skin - continue skin care protocol as ordered  Yeast dermatitis  - treating with nystatin creme product   History of DVT  - pt remains on apixaban   Diastolic heart failure with reported outflow obstruction  - cardiology was consulted, will see on 11/28.   Heart murmur  - likely due to aortic valve disease noted on recent Echo   DVT prophylaxis: apixaban  Code Status: full  Family Communication: patient  Disposition: anticipating SNF  Status is: Inpatient  Remains inpatient appropriate because: IV treatments required   Consultants:  Cardiology   Procedures:  IMPRESSIONS   1. Severe hypertrophy of the basal septum with otherwise moderate concentric hypertrophy. Basal septum seems to be causing outflow tract obstruction. Peak velocity 3.19 m/s, peak gradient 40.9 mmHg. Gradient increases 83.2  mmhg, velocity 4.56 m/s with Valsalva. There is mild SAM. Left ventricular ejection fraction, by estimation, is 60 to 65%. The left ventricle has normal function. The left ventricle has no regional wall motion abnormalities. Left ventricular diastolic parameters are consistent with Grade I diastolic dysfunction (impaired relaxation).   2. Right ventricular systolic function is normal. The right ventricular size is normal. There is normal pulmonary artery systolic pressure.   3. Left atrial size was moderately dilated.   4. The mitral valve is normal in structure. No evidence of mitral valve regurgitation. No evidence of mitral stenosis.   5. Calcified aortic valve mostly affecting the right coronary cusp. The aortic valve is calcified. Aortic valve regurgitation is not visualized.  Mild aortic valve stenosis. Aortic valve mean gradient measures 23.0 mmHg. Aortic valve Vmax measures 3.22 m/s.   6. The inferior vena cava is normal in size with greater than 50% respiratory variability, suggesting right atrial pressure of 3 mmHg.   Antimicrobials:  Ceftriaxone 11/26>>>   Subjective: No specific complaints   Objective: Vitals:   05/21/21 2110 05/22/21 0539 05/22/21 0854 05/22/21 1441  BP: (!) 123/55 132/71 140/80 (!) 116/53  Pulse: 83 92 91 93  Resp: 18 17 18 18   Temp: 98 F (36.7 C) 98.4 F (36.9 C)  98.6 F (37 C)  TempSrc: Oral   Oral  SpO2: 100% 96% 96% 98%  Weight:      Height:        Intake/Output Summary (Last 24 hours) at 05/22/2021 1448 Last data filed at 05/22/2021 0900 Gross per 24 hour  Intake 920 ml  Output 300 ml  Net 620 ml   Filed Weights   05/20/21 1526  Weight: 70 kg    Examination:  General exam: NAD. Appears calm and comfortable  Respiratory system: Clear to auscultation. Respiratory effort normal. Cardiovascular system: normal S1 & S2 heard. No JVD, 2/6 murmur heard, No rubs, gallops or clicks. Trace pedal edema. Gastrointestinal system: Abdomen is  nondistended, soft and nontender. No organomegaly or masses felt. Normal bowel sounds heard. Central nervous system: Alert and oriented. No focal neurological deficits. Extremities: Symmetric 5 x 5 power. Skin: No rashes, lesions or ulcers Psychiatry: Judgement and insight appear poor. Mood & affect appropriate.   Data Reviewed: I have personally reviewed following labs and imaging studies  CBC: Recent Labs  Lab 05/20/21 1717 05/21/21 0152  WBC 9.3 6.5  NEUTROABS 7.7 4.9  HGB 12.6 10.0*  HCT 39.4 30.4*  MCV 103.4* 102.4*  PLT 186 161    Basic Metabolic Panel: Recent Labs  Lab 05/20/21 1717 05/21/21 0152 05/22/21 0841  NA 140 140 138  K 3.7 3.3* 3.7  CL 109 112* 111  CO2 23 22 23   GLUCOSE 102* 103* 95  BUN 36* 29* 21  CREATININE 0.76 0.52 0.49  CALCIUM 9.4 8.5* 8.4*  MG  --  1.8  --     GFR: Estimated Creatinine Clearance (by C-G formula based on SCr of 0.49 mg/dL) Female: 50.8 mL/min Female: 64.2 mL/min  Liver Function Tests: Recent Labs  Lab 05/20/21 1717 05/21/21 0152  AST 19 16  ALT 15 12  ALKPHOS 94 69  BILITOT 0.6 0.5  PROT 7.4 5.6*  ALBUMIN 3.9 2.9*    CBG: No results for input(s): GLUCAP in the last 168 hours.  Recent Results (from the past 240 hour(s))  Resp Panel by RT-PCR (Flu A&B, Covid) Nasopharyngeal Swab     Status: None   Collection Time: 05/20/21 10:20 PM   Specimen: Nasopharyngeal Swab; Nasopharyngeal(NP) swabs in vial transport medium  Result Value Ref Range Status   SARS Coronavirus 2 by RT PCR NEGATIVE NEGATIVE Final    Comment: (NOTE) SARS-CoV-2 target nucleic acids are NOT DETECTED.  The SARS-CoV-2 RNA is generally detectable in upper respiratory specimens during the acute phase of infection. The lowest concentration of SARS-CoV-2 viral copies this assay can detect is 138 copies/mL. A negative result does not preclude SARS-Cov-2 infection and should not be used as the sole basis for treatment or other patient management  decisions. A negative result may occur with  improper specimen collection/handling, submission of specimen other than nasopharyngeal swab, presence of viral mutation(s) within the areas targeted by this assay, and inadequate number of viral copies(<138 copies/mL). A negative result must be combined with clinical observations, patient history, and epidemiological information. The expected result is Negative.  Fact Sheet for Patients:  EntrepreneurPulse.com.au  Fact Sheet for Healthcare Providers:  IncredibleEmployment.be  This test is no t yet approved or cleared by the [place] FDA and  has been authorized for detection and/or diagnosis of SARS-CoV-2 by FDA under an Emergency Use Authorization (EUA). This EUA will remain  in effect (meaning this test can be used) for the duration of the COVID-19 declaration under Section 564(b)(1) of the Act, 21 U.S.C.section 360bbb-3(b)(1), unless the authorization is terminated  or revoked sooner.       Influenza A by PCR NEGATIVE NEGATIVE Final   Influenza B by PCR NEGATIVE NEGATIVE Final    Comment: (NOTE) The Xpert Xpress SARS-CoV-2/FLU/RSV plus assay is intended as an aid in the diagnosis of  influenza from Nasopharyngeal swab specimens and should not be used as a sole basis for treatment. Nasal washings and aspirates are unacceptable for Xpert Xpress SARS-CoV-2/FLU/RSV testing.  Fact Sheet for Patients: EntrepreneurPulse.com.au  Fact Sheet for Healthcare Providers: IncredibleEmployment.be  This test is not yet approved or cleared by the [place] FDA and has been authorized for detection and/or diagnosis of SARS-CoV-2 by FDA under an Emergency Use Authorization (EUA). This EUA will remain in effect (meaning this test can be used) for the duration of the COVID-19 declaration under Section 564(b)(1) of the Act, 21 U.S.C. section 360bbb-3(b)(1), unless the  authorization is terminated or revoked.  Performed at [facility], [address] [place], [place] [address]      Radiology Studies: DG Chest 2 View  Result Date: 05/20/2021 CLINICAL DATA:  Pain recent fall EXAM: CHEST - 2 VIEW COMPARISON:  01/31/2021 FINDINGS: The heart size and mediastinal contours are within normal limits. Both lungs are clear. The visualized skeletal structures are unremarkable. Surgical changes at the left axilla. Aortic atherosclerosis. Chronic appearing right-sided rib fractures. IMPRESSION: No active cardiopulmonary disease. Electronically Signed   By: Donavan Foil M.D.   On: 05/20/2021 18:36   DG Foot 2 Views Right  Result Date: 05/20/2021 CLINICAL DATA:  Foot pain recent fall EXAM: RIGHT FOOT - 2 VIEW COMPARISON:  01/24/2016 FINDINGS: Bones appear osteopenic which limits the exam. No malalignment. Degenerative changes at the first MTP joint. Questionable fracture deformity at the neck of the fifth proximal phalanx on oblique view. IMPRESSION: Limited by osteopenia. Questionable fracture deformity at the neck of fifth proximal phalanx, correlate for point tenderness Electronically Signed   By: Donavan Foil M.D.   On: 05/20/2021 18:34   ECHOCARDIOGRAM COMPLETE  Result Date: 05/21/2021    ECHOCARDIOGRAM REPORT   Patient Name:   Tammy Terry Date of Exam: 05/21/2021 Medical Rec #:  [id number]      Height:       66.0 in Accession #:    [id number]     Weight:       154.3 lb Date of Birth:  28-Oct-1940      BSA:          1.791 m Patient Age:    69 years       BP:           128/67 mmHg Patient Gender: F              HR:           82 bpm. Exam Location:  [facility] Procedure: 2D Echo, Cardiac Doppler and Color Doppler Indications:    Murmur R01.1  History:        Patient has prior history of Echocardiogram examinations, most                 recent 08/21/2017. Risk Factors:Hypertension and Dyslipidemia.                 Breast cancer, stage 3, Left mastectomy. COVID-19 virus                  infection.  Sonographer:    Alvino Chapel RCS Referring Phys: [id number] ASIA B [place]  1. Severe hypertrophy of the basal septum with otherwise moderate concentric hypertrophy. Basal septum seems to be causing outflow tract obstruction. Peak velocity 3.19 m/s, peak gradient 40.9 mmHg. Gradient increases 83.2 mmhg, velocity 4.56 m/s with Valsalva. There is mild SAM. Left ventricular ejection fraction, by estimation, is 60  to 65%. The left ventricle has normal function. The left ventricle has no regional wall motion abnormalities. Left ventricular diastolic parameters are consistent with Grade I diastolic dysfunction (impaired relaxation).  2. Right ventricular systolic function is normal. The right ventricular size is normal. There is normal pulmonary artery systolic pressure.  3. Left atrial size was moderately dilated.  4. The mitral valve is normal in structure. No evidence of mitral valve regurgitation. No evidence of mitral stenosis.  5. Calcified aortic valve mostly affecting the right coronary cusp. The aortic valve is calcified. Aortic valve regurgitation is not visualized. Mild aortic valve stenosis. Aortic valve mean gradient measures 23.0 mmHg. Aortic valve Vmax measures 3.22 m/s.  6. The inferior vena cava is normal in size with greater than 50% respiratory variability, suggesting right atrial pressure of 3 mmHg. FINDINGS  Left Ventricle: Severe hypertrophy of the basal septum with otherwise moderate concentric hypertrophy. Basal septum seems to be causing outflow tract obstruction. Peak velocity 3.19 m/s, peak gradient 40.9 mmHg. Gradient increases 83.2 mmhg, velocity 4.56 m/s with Valsalva. There is mild SAM. Left ventricular ejection fraction, by estimation, is 60 to 65%. The left ventricle has normal function. The left ventricle has no regional wall motion abnormalities. The left ventricular internal cavity size was normal in size. There is no left ventricular hypertrophy.  Left ventricular diastolic parameters are consistent with Grade I diastolic dysfunction (impaired relaxation). Right Ventricle: The right ventricular size is normal. No increase in right ventricular wall thickness. Right ventricular systolic function is normal. There is normal pulmonary artery systolic pressure. The tricuspid regurgitant velocity is 1.41 m/s, and  with an assumed right atrial pressure of 3 mmHg, the estimated right ventricular systolic pressure is 45.8 mmHg. Left Atrium: Left atrial size was moderately dilated. Right Atrium: Right atrial size was normal in size. Pericardium: There is no evidence of pericardial effusion. Mitral Valve: The mitral valve is normal in structure. No evidence of mitral valve regurgitation. No evidence of mitral valve stenosis. Tricuspid Valve: The tricuspid valve is normal in structure. Tricuspid valve regurgitation is trivial. No evidence of tricuspid stenosis. Aortic Valve: Calcified aortic valve mostly affecting the right coronary cusp. The aortic valve is calcified. Aortic valve regurgitation is not visualized. Mild aortic stenosis is present. Aortic valve mean gradient measures 23.0 mmHg. Aortic valve peak gradient measures 41.5 mmHg. Pulmonic Valve: The pulmonic valve was normal in structure. Pulmonic valve regurgitation is not visualized. No evidence of pulmonic stenosis. Aorta: The aortic root is normal in size and structure. Venous: The inferior vena cava is normal in size with greater than 50% respiratory variability, suggesting right atrial pressure of 3 mmHg. IAS/Shunts: No atrial level shunt detected by color flow Doppler.  LEFT VENTRICLE PLAX 2D LVIDd:         3.10 cm   Diastology LVIDs:         2.10 cm   LV e' medial:    5.55 cm/s LV PW:         1.40 cm   LV E/e' medial:  22.5 LV IVS:        1.60 cm   LV e' lateral:   5.44 cm/s LVOT diam:     1.60 cm   LV E/e' lateral: 23.0 LVOT Area:     2.01 cm  RIGHT VENTRICLE RV S prime:     16.00 cm/s TAPSE (M-mode):  2.4 cm LEFT ATRIUM             Index  RIGHT ATRIUM           Index LA diam:        4.00 cm 2.23 cm/m   RA Area:     12.20 cm LA Vol (A2C):   76.8 ml 42.88 ml/m  RA Volume:   26.80 ml  14.96 ml/m LA Vol (A4C):   46.0 ml 25.68 ml/m LA Biplane Vol: 60.3 ml 33.67 ml/m  AORTIC VALVE AV Vmax:      322.00 cm/s AV Vmean:     198.000 cm/s AV VTI:       0.646 m AV Peak Grad: 41.5 mmHg AV Mean Grad: 23.0 mmHg  AORTA Ao Root diam: 3.00 cm MITRAL VALVE                TRICUSPID VALVE MV Area (PHT): 2.22 cm     TR Peak grad:   8.0 mmHg MV Decel Time: 342 msec     TR Vmax:        141.00 cm/s MV E velocity: 125.00 cm/s MV A velocity: 181.00 cm/s  SHUNTS MV E/A ratio:  0.69         Systemic Diam: 1.60 cm Skeet Latch MD Electronically signed by Skeet Latch MD Signature Date/Time: 05/21/2021/4:39:58 PM    Final     Scheduled Meds:  amLODipine  5 mg Oral Daily   apixaban  5 mg Oral BID   fluconazole  200 mg Oral Daily   nystatin-triamcinolone   Topical BID   pantoprazole  40 mg Oral Daily   Continuous Infusions:  cefTRIAXone (ROCEPHIN)  IV 1 g (05/21/21 2105)    LOS: 2 days   Time spent: 38 mins   Loanne Emery Wynetta Emery, MD How to contact the [facility] Attending or Consulting provider [place] or covering provider during after hours [place], for this patient?  Check the care team in [facility] and look for a) attending/consulting [facility] provider listed and b) the [facility] team listed Log into [email or web address] and use [place]'s universal password to access. If you do not have the password, please contact the hospital operator. Locate the [facility] provider you are looking for under [facility] and page to a number that you can be directly reached. If you still have difficulty reaching the provider, please page the [facility] (Director on Call) for the Hospitalists listed on amion for assistance.  05/22/2021, 2:48 PM

## 2021-05-23 DIAGNOSIS — G9341 Metabolic encephalopathy: Secondary | ICD-10-CM | POA: Diagnosis not present

## 2021-05-23 DIAGNOSIS — I5032 Chronic diastolic (congestive) heart failure: Secondary | ICD-10-CM | POA: Diagnosis not present

## 2021-05-23 DIAGNOSIS — R279 Unspecified lack of coordination: Secondary | ICD-10-CM | POA: Diagnosis not present

## 2021-05-23 DIAGNOSIS — I5189 Other ill-defined heart diseases: Secondary | ICD-10-CM

## 2021-05-23 DIAGNOSIS — Z87440 Personal history of urinary (tract) infections: Secondary | ICD-10-CM | POA: Diagnosis not present

## 2021-05-23 DIAGNOSIS — D649 Anemia, unspecified: Secondary | ICD-10-CM | POA: Diagnosis not present

## 2021-05-23 DIAGNOSIS — B372 Candidiasis of skin and nail: Secondary | ICD-10-CM

## 2021-05-23 DIAGNOSIS — G8929 Other chronic pain: Secondary | ICD-10-CM | POA: Diagnosis not present

## 2021-05-23 DIAGNOSIS — R531 Weakness: Secondary | ICD-10-CM | POA: Diagnosis not present

## 2021-05-23 DIAGNOSIS — R41841 Cognitive communication deficit: Secondary | ICD-10-CM | POA: Diagnosis not present

## 2021-05-23 DIAGNOSIS — R011 Cardiac murmur, unspecified: Secondary | ICD-10-CM | POA: Diagnosis not present

## 2021-05-23 DIAGNOSIS — N390 Urinary tract infection, site not specified: Secondary | ICD-10-CM | POA: Diagnosis not present

## 2021-05-23 DIAGNOSIS — I422 Other hypertrophic cardiomyopathy: Secondary | ICD-10-CM | POA: Diagnosis not present

## 2021-05-23 DIAGNOSIS — Z7409 Other reduced mobility: Secondary | ICD-10-CM | POA: Diagnosis not present

## 2021-05-23 DIAGNOSIS — L8942 Pressure ulcer of contiguous site of back, buttock and hip, stage 2: Secondary | ICD-10-CM | POA: Diagnosis not present

## 2021-05-23 DIAGNOSIS — M6281 Muscle weakness (generalized): Secondary | ICD-10-CM | POA: Diagnosis not present

## 2021-05-23 DIAGNOSIS — R2689 Other abnormalities of gait and mobility: Secondary | ICD-10-CM | POA: Diagnosis not present

## 2021-05-23 DIAGNOSIS — R5381 Other malaise: Secondary | ICD-10-CM | POA: Diagnosis not present

## 2021-05-23 DIAGNOSIS — K219 Gastro-esophageal reflux disease without esophagitis: Secondary | ICD-10-CM | POA: Diagnosis not present

## 2021-05-23 DIAGNOSIS — M810 Age-related osteoporosis without current pathological fracture: Secondary | ICD-10-CM | POA: Diagnosis not present

## 2021-05-23 DIAGNOSIS — L8990 Pressure ulcer of unspecified site, unspecified stage: Secondary | ICD-10-CM | POA: Diagnosis not present

## 2021-05-23 DIAGNOSIS — I10 Essential (primary) hypertension: Secondary | ICD-10-CM | POA: Diagnosis not present

## 2021-05-23 DIAGNOSIS — L309 Dermatitis, unspecified: Secondary | ICD-10-CM | POA: Diagnosis not present

## 2021-05-23 DIAGNOSIS — Z7401 Bed confinement status: Secondary | ICD-10-CM | POA: Diagnosis not present

## 2021-05-23 DIAGNOSIS — E785 Hyperlipidemia, unspecified: Secondary | ICD-10-CM | POA: Diagnosis not present

## 2021-05-23 DIAGNOSIS — I5030 Unspecified diastolic (congestive) heart failure: Secondary | ICD-10-CM | POA: Diagnosis not present

## 2021-05-23 LAB — CBC
HCT: 32.6 % — ABNORMAL LOW (ref 36.0–46.0)
Hemoglobin: 10.2 g/dL — ABNORMAL LOW (ref 12.0–15.0)
MCH: 32.4 pg (ref 26.0–34.0)
MCHC: 31.3 g/dL (ref 30.0–36.0)
MCV: 103.5 fL — ABNORMAL HIGH (ref 80.0–100.0)
Platelets: 122 10*3/uL — ABNORMAL LOW (ref 150–400)
RBC: 3.15 MIL/uL — ABNORMAL LOW (ref 3.87–5.11)
RDW: 13.3 % (ref 11.5–15.5)
WBC: 7.3 10*3/uL (ref 4.0–10.5)
nRBC: 0 % (ref 0.0–0.2)

## 2021-05-23 LAB — BASIC METABOLIC PANEL
Anion gap: 5 (ref 5–15)
BUN: 19 mg/dL (ref 8–23)
CO2: 25 mmol/L (ref 22–32)
Calcium: 8.7 mg/dL — ABNORMAL LOW (ref 8.9–10.3)
Chloride: 109 mmol/L (ref 98–111)
Creatinine, Ser: 0.56 mg/dL (ref 0.44–1.00)
GFR, Estimated: >60 mL/min (ref >60)
Glucose, Bld: 82 mg/dL (ref 70–99)
Potassium: 4.1 mmol/L (ref 3.5–5.1)
Sodium: 139 mmol/L (ref 135–145)

## 2021-05-23 MED ORDER — METOPROLOL SUCCINATE ER 50 MG PO TB24: 50.0000 mg | ORAL_TABLET | Freq: Every day | ORAL | Status: DC

## 2021-05-23 MED ORDER — GERHARDT'S BUTT CREAM
TOPICAL_CREAM | Freq: Every day | CUTANEOUS | Status: DC
  Filled 2021-05-23: qty 1

## 2021-05-23 MED ORDER — GERHARDT'S BUTT CREAM: TOPICAL_CREAM | CUTANEOUS | Status: DC

## 2021-05-23 MED ORDER — OXYCODONE HCL 5 MG PO TABS: 5.0000 mg | ORAL_TABLET | Freq: Four times a day (QID) | ORAL | 0 refills | Status: AC | PRN

## 2021-05-23 MED ORDER — METOPROLOL SUCCINATE ER 50 MG PO TB24
50.0000 mg | ORAL_TABLET | Freq: Every day | ORAL | Status: DC
  Administered 2021-05-23: 12:00:00 50 mg via ORAL
  Filled 2021-05-23: qty 1

## 2021-05-23 NOTE — Consult Note (Signed)
Cardiology Consultation:   Patient ID: Tammy Terry MRN: [id number]; DOB: 1940/11/15  Admit date: 05/20/2021 Date of Consult: 05/23/2021  PCP:  Celene Squibb, MD   [facility] Providers Cardiologist:  None        Patient Profile:   Tammy Terry is a 80 y.o. adult with a hx of breast cancer s/p chemo and XRT, HTN, HLD, and major depressive disorder  who is being seen 05/23/2021 for the evaluation of LVOT outflow obstruction at the request of Dr. Wynetta Emery.  History of Present Illness:   Tammy Terry is a 80 year old female with history as detailed above who was last seen in 2017 for syncope with Dr. Bronson Ing which was thought to be noncardiac in nature (gradient on TTE at that time 12mmHg). She did not have regular CV follow-up.  The patient presented to the ED with generalized weakness found to be immobile at home for several days covered in her own urine and feces. Work-up here notable for UTI. Overall she is clinically improving. Cardiology was consulted given LVOTO noted on TTE.  Currently, the patient states she is feeling better. Denies any chest pain, SOB, lightheadedness or recent syncope. Notably, she states she walks around the house and ambulates some outside with a walker but she was immobile on presentation. It is unclear if she is symptomatic due to lack of activity at home.   TTE reveals EF 60-65% with severe basal-septal hypertrophy with peak gradient 37mmHg that increases to 59mmhg with valsalva. Peak velocity 3.3m/s that peaks to 4.53m/s with velocity. Mild late-systolic SAM with mild MR. Mild AS.   Past Medical History:  Diagnosis Date   Acquired absence of unspecified breast and nipple    Age-related osteoporosis without current pathological fracture    Age-related osteoporosis without current pathological fracture    Anxiety    Anxiety disorder, unspecified    Cancer ([place]) 1994   BREAST CANCER, TREATED WITH RADIATION AND CHEMOTHERAPY   Depression with  anxiety 03/01/2012   Essential (primary) hypertension    Fatty (change of) liver, not elsewhere classified    Fatty liver    H/O vitamin D deficiency    Hyperlipidemia 03/01/2012   Hypertension    Insomnia, unspecified    Localized edema    Lumbago    Major depressive disorder, single episode, unspecified    Malignant neoplasm of unspecified site of unspecified female breast ([place])    Osteoporosis    Other abnormalities of gait and mobility    Polyosteoarthritis, unspecified    Radiculopathy, site unspecified    Rosacea    Syncope and collapse    Tremor, unspecified    Unspecified convulsions ([place])    Unspecified fall, initial encounter     Past Surgical History:  Procedure Laterality Date   [place]   LEFT MASTECTOMY    CAPSULOTOMY  august 2015   of left eye   CATARACT EXTRACTION Left    CESAREAN SECTION     COLONOSCOPY N/A 05/13/2014   Procedure: COLONOSCOPY;  Surgeon: Rogene Houston, MD;  Location: [facility];  Service: Endoscopy;  Laterality: N/A;  79   MASTECTOMY     left breast for cancer in [place] Medications:  Prior to Admission medications   Medication Sig Start Date End Date Taking? Authorizing Provider  apixaban (ELIQUIS) 5 MG TABS tablet Take 1 tablet (5 mg total) by mouth 2 (two) times daily. 03/03/21  Yes Emokpae, Courage, MD  ascorbic acid (VITAMIN C) 500 MG tablet Take 1 tablet (500 mg total) by mouth daily. 02/04/21  Yes Roxan Hockey, MD  acetaminophen (TYLENOL) 325 MG tablet Take 650 mg by mouth every 6 (six) hours as needed for headache. Patient not taking: Reported on 05/21/2021    [provider]  amLODipine (NORVASC) 2.5 MG tablet Take 1 tablet (2.5 mg total) by mouth daily. FOR BP Patient not taking: Reported on 05/21/2021 02/03/21 02/03/22  Roxan Hockey, MD  Apixaban Starter Pack, 10mg  and 5mg , (ELIQUIS DVT/PE STARTER PACK) Take as directed on package: start with two-5mg  tablets twice daily for 7  days. On day 8, switch to one-5mg  tablet twice daily. Patient not taking: Reported on 05/21/2021 02/03/21   Roxan Hockey, MD  ketoconazole (NIZORAL) 2 % shampoo Apply topically. Patient not taking: Reported on 05/20/2021 04/21/21   [provider]  pantoprazole (PROTONIX) 40 MG tablet Take 1 tablet (40 mg total) by mouth daily. Patient not taking: Reported on 05/20/2021 02/03/21 02/03/22  Roxan Hockey, MD  zinc sulfate 220 (50 Zn) MG capsule Take 1 capsule (220 mg total) by mouth daily. Patient not taking: Reported on 05/20/2021 02/04/21   Roxan Hockey, MD    Inpatient Medications: Scheduled Meds:  amLODipine  5 mg Oral Daily   apixaban  5 mg Oral BID   Gerhardt's butt cream   Topical Daily   metoprolol succinate  50 mg Oral Daily   nystatin-triamcinolone   Topical BID   pantoprazole  40 mg Oral Daily   Continuous Infusions:  cefTRIAXone (ROCEPHIN)  IV 1 g (05/22/21 2013)   PRN Meds: acetaminophen **OR** acetaminophen, ondansetron **OR** ondansetron (ZOFRAN) IV, oxyCODONE  Allergies:    Allergies  Allergen Reactions   Amoxicillin Other (See Comments)    Unknown reaction   Codeine     Unknown reaction   Penicillins     Long time ago . But no anaphylaxis    Social History:   Social History   Socioeconomic History   Marital status: Married    Spouse name: Not on file   Number of children: 1   Years of education: college   Highest education level: Not on file  Occupational History   Occupation: Retired  Tobacco Use   Smoking status: Never   Smokeless tobacco: Never  Substance and Sexual Activity   Alcohol use: No    Alcohol/week: 0.0 standard drinks   Drug use: No   Sexual activity: Never  Other Topics Concern   Not on file  Social History Narrative   Denies caffeine use    Social Determinants of Radio broadcast assistant Strain: Not on file  Food Insecurity: Not on file  Transportation Needs: Not on file  Physical Activity: Not on file   Stress: Not on file  Social Connections: Not on file  Intimate Partner Violence: Not on file    Family History:    Family History  Problem Relation Age of Onset   Heart attack Father    Diabetes Paternal Grandmother      ROS:  Please see the history of present illness.  Review of Systems  Respiratory:  Negative for shortness of breath.   Cardiovascular:  Negative for chest pain, palpitations and leg swelling.  Neurological:  Negative for dizziness and loss of consciousness.   All other ROS reviewed and negative.     Physical Exam/Data:   Vitals:   05/22/21 1441 05/22/21 1928 05/23/21 0436 05/23/21 0017  BP: (!) 116/53 122/68 (!) 142/80 (!) 141/71  Pulse: 93 92 86 81  Resp: 18 18 15    Temp: 98.6 F (37 C) 98.3 F (36.8 C) 98.2 F (36.8 C)   TempSrc: Oral Oral Oral   SpO2: 98% 100% 100%   Weight:      Height:        Intake/Output Summary (Last 24 hours) at 05/23/2021 1016 Last data filed at 05/23/2021 0023 Gross per 24 hour  Intake 1360 ml  Output 750 ml  Net 610 ml   Last 3 Weights 05/20/2021 01/31/2021 04/01/2018  Weight (lbs) 154 lb 5.2 oz 154 lb 5.2 oz 155 lb  Weight (kg) 70 kg 70 kg 70.308 kg  Some encounter information is confidential and restricted. Go to Review Flowsheets activity to see all data.     Body mass index is 24.91 kg/m.  General:  Elderly female, NAD HEENT: normal Neck: no JVD Vascular: No carotid bruits; Distal pulses 2+ bilaterally Cardiac:  RR, 3/6 harsh systolic murmur best heard at RUSB Lungs:  clear to auscultation bilaterally, no wheezing, rhonchi or rales  Abd: soft, nontender, nondistended Ext: no edema Musculoskeletal:  Warm, no edema Skin: warm and dry  Neuro:  CNs 2-12 intact, no focal abnormalities noted Psych:  Normal affect   EKG:  The EKG was personally reviewed and demonstrates:  NSR with LVH Telemetry:  Telemetry was personally reviewed and demonstrates:  Not on tele  Relevant CV Studies: TTE  05/21/2021: IMPRESSIONS     1. Severe hypertrophy of the basal septum with otherwise moderate  concentric hypertrophy. Basal septum seems to be causing outflow tract  obstruction. Peak velocity 3.19 m/s, peak gradient 40.9 mmHg. Gradient  increases 83.2 mmhg, velocity 4.56 m/s with  Valsalva. There is mild SAM. Left ventricular ejection fraction, by  estimation, is 60 to 65%. The left ventricle has normal function. The left  ventricle has no regional wall motion abnormalities. Left ventricular  diastolic parameters are consistent with  Grade I diastolic dysfunction (impaired relaxation).   2. Right ventricular systolic function is normal. The right ventricular  size is normal. There is normal pulmonary artery systolic pressure.   3. Left atrial size was moderately dilated.   4. The mitral valve is normal in structure. No evidence of mitral valve  regurgitation. No evidence of mitral stenosis.   5. Calcified aortic valve mostly affecting the right coronary cusp. The  aortic valve is calcified. Aortic valve regurgitation is not visualized.  Mild aortic valve stenosis. Aortic valve mean gradient measures 23.0 mmHg.  Aortic valve Vmax measures 3.22  m/s.   6. The inferior vena cava is normal in size with greater than 50%  respiratory variability, suggesting right atrial pressure of 3 mmHg.   Laboratory Data:  High Sensitivity Troponin:   Recent Labs  Lab 05/20/21 2330  TROPONINIHS 8     Chemistry Recent Labs  Lab 05/21/21 0152 05/22/21 0841 05/23/21 0320  NA 140 138 139  K 3.3* 3.7 4.1  CL 112* 111 109  CO2 22 23 25   GLUCOSE 103* 95 82  BUN 29* 21 19  CREATININE 0.52 0.49 0.56  CALCIUM 8.5* 8.4* 8.7*  MG 1.8  --   --   GFRNONAA >60 >60 >60  ANIONGAP 6 4* 5    Recent Labs  Lab 05/20/21 1717 05/21/21 0152  PROT 7.4 5.6*  ALBUMIN 3.9 2.9*  AST 19 16  ALT 15 12  ALKPHOS 94 69  BILITOT 0.6 0.5  Lipids No results for input(s): CHOL, TRIG, HDL, LABVLDL, LDLCALC,  CHOLHDL in the last 168 hours.  Hematology Recent Labs  Lab 05/20/21 1717 05/21/21 0152 05/23/21 0320  WBC 9.3 6.5 7.3  RBC 3.81* 2.97* 3.15*  HGB 12.6 10.0* 10.2*  HCT 39.4 30.4* 32.6*  MCV 103.4* 102.4* 103.5*  MCH 33.1 33.7 32.4  MCHC 32.0 32.9 31.3  RDW 13.5 13.4 13.3  PLT 186 152 122*   Thyroid No results for input(s): TSH, FREET4 in the last 168 hours.  BNPNo results for input(s): BNP, PROBNP in the last 168 hours.  DDimer No results for input(s): DDIMER in the last 168 hours.   Radiology/Studies:  DG Chest 2 View  Result Date: 05/20/2021 CLINICAL DATA:  Pain recent fall EXAM: CHEST - 2 VIEW COMPARISON:  01/31/2021 FINDINGS: The heart size and mediastinal contours are within normal limits. Both lungs are clear. The visualized skeletal structures are unremarkable. Surgical changes at the left axilla. Aortic atherosclerosis. Chronic appearing right-sided rib fractures. IMPRESSION: No active cardiopulmonary disease. Electronically Signed   By: Donavan Foil M.D.   On: 05/20/2021 18:36   DG Foot 2 Views Right  Result Date: 05/20/2021 CLINICAL DATA:  Foot pain recent fall EXAM: RIGHT FOOT - 2 VIEW COMPARISON:  01/24/2016 FINDINGS: Bones appear osteopenic which limits the exam. No malalignment. Degenerative changes at the first MTP joint. Questionable fracture deformity at the neck of the fifth proximal phalanx on oblique view. IMPRESSION: Limited by osteopenia. Questionable fracture deformity at the neck of fifth proximal phalanx, correlate for point tenderness Electronically Signed   By: Donavan Foil M.D.   On: 05/20/2021 18:34   ECHOCARDIOGRAM COMPLETE  Result Date: 05/21/2021    ECHOCARDIOGRAM REPORT   Patient Name:   Tammy Terry Date of Exam: 05/21/2021 Medical Rec #:  [id number]      Height:       66.0 in Accession #:    [id number]     Weight:       154.3 lb Date of Birth:  05/31/1941      BSA:          1.791 m Patient Age:    89 years       BP:           128/67 mmHg  Patient Gender: F              HR:           82 bpm. Exam Location:  [facility] Procedure: 2D Echo, Cardiac Doppler and Color Doppler Indications:    Murmur R01.1  History:        Patient has prior history of Echocardiogram examinations, most                 recent 08/21/2017. Risk Factors:Hypertension and Dyslipidemia.                 Breast cancer, stage 3, Left mastectomy. COVID-19 virus                 infection.  Sonographer:    Alvino Chapel RCS Referring Phys: [id number] ASIA B [place]  1. Severe hypertrophy of the basal septum with otherwise moderate concentric hypertrophy. Basal septum seems to be causing outflow tract obstruction. Peak velocity 3.19 m/s, peak gradient 40.9 mmHg. Gradient increases 83.2 mmhg, velocity 4.56 m/s with Valsalva. There is mild SAM. Left ventricular ejection fraction, by estimation, is 60 to 65%. The left ventricle has normal function. The left ventricle has  no regional wall motion abnormalities. Left ventricular diastolic parameters are consistent with Grade I diastolic dysfunction (impaired relaxation).  2. Right ventricular systolic function is normal. The right ventricular size is normal. There is normal pulmonary artery systolic pressure.  3. Left atrial size was moderately dilated.  4. The mitral valve is normal in structure. No evidence of mitral valve regurgitation. No evidence of mitral stenosis.  5. Calcified aortic valve mostly affecting the right coronary cusp. The aortic valve is calcified. Aortic valve regurgitation is not visualized. Mild aortic valve stenosis. Aortic valve mean gradient measures 23.0 mmHg. Aortic valve Vmax measures 3.22 m/s.  6. The inferior vena cava is normal in size with greater than 50% respiratory variability, suggesting right atrial pressure of 3 mmHg. FINDINGS  Left Ventricle: Severe hypertrophy of the basal septum with otherwise moderate concentric hypertrophy. Basal septum seems to be causing outflow tract obstruction. Peak  velocity 3.19 m/s, peak gradient 40.9 mmHg. Gradient increases 83.2 mmhg, velocity 4.56 m/s with Valsalva. There is mild SAM. Left ventricular ejection fraction, by estimation, is 60 to 65%. The left ventricle has normal function. The left ventricle has no regional wall motion abnormalities. The left ventricular internal cavity size was normal in size. There is no left ventricular hypertrophy. Left ventricular diastolic parameters are consistent with Grade I diastolic dysfunction (impaired relaxation). Right Ventricle: The right ventricular size is normal. No increase in right ventricular wall thickness. Right ventricular systolic function is normal. There is normal pulmonary artery systolic pressure. The tricuspid regurgitant velocity is 1.41 m/s, and  with an assumed right atrial pressure of 3 mmHg, the estimated right ventricular systolic pressure is 67.8 mmHg. Left Atrium: Left atrial size was moderately dilated. Right Atrium: Right atrial size was normal in size. Pericardium: There is no evidence of pericardial effusion. Mitral Valve: The mitral valve is normal in structure. No evidence of mitral valve regurgitation. No evidence of mitral valve stenosis. Tricuspid Valve: The tricuspid valve is normal in structure. Tricuspid valve regurgitation is trivial. No evidence of tricuspid stenosis. Aortic Valve: Calcified aortic valve mostly affecting the right coronary cusp. The aortic valve is calcified. Aortic valve regurgitation is not visualized. Mild aortic stenosis is present. Aortic valve mean gradient measures 23.0 mmHg. Aortic valve peak gradient measures 41.5 mmHg. Pulmonic Valve: The pulmonic valve was normal in structure. Pulmonic valve regurgitation is not visualized. No evidence of pulmonic stenosis. Aorta: The aortic root is normal in size and structure. Venous: The inferior vena cava is normal in size with greater than 50% respiratory variability, suggesting right atrial pressure of 3 mmHg. IAS/Shunts:  No atrial level shunt detected by color flow Doppler.  LEFT VENTRICLE PLAX 2D LVIDd:         3.10 cm   Diastology LVIDs:         2.10 cm   LV e' medial:    5.55 cm/s LV PW:         1.40 cm   LV E/e' medial:  22.5 LV IVS:        1.60 cm   LV e' lateral:   5.44 cm/s LVOT diam:     1.60 cm   LV E/e' lateral: 23.0 LVOT Area:     2.01 cm  RIGHT VENTRICLE RV S prime:     16.00 cm/s TAPSE (M-mode): 2.4 cm LEFT ATRIUM             Index        RIGHT ATRIUM  Index LA diam:        4.00 cm 2.23 cm/m   RA Area:     12.20 cm LA Vol (A2C):   76.8 ml 42.88 ml/m  RA Volume:   26.80 ml  14.96 ml/m LA Vol (A4C):   46.0 ml 25.68 ml/m LA Biplane Vol: 60.3 ml 33.67 ml/m  AORTIC VALVE AV Vmax:      322.00 cm/s AV Vmean:     198.000 cm/s AV VTI:       0.646 m AV Peak Grad: 41.5 mmHg AV Mean Grad: 23.0 mmHg  AORTA Ao Root diam: 3.00 cm MITRAL VALVE                TRICUSPID VALVE MV Area (PHT): 2.22 cm     TR Peak grad:   8.0 mmHg MV Decel Time: 342 msec     TR Vmax:        141.00 cm/s MV E velocity: 125.00 cm/s MV A velocity: 181.00 cm/s  SHUNTS MV E/A ratio:  0.69         Systemic Diam: 1.60 cm Skeet Latch MD Electronically signed by Skeet Latch MD Signature Date/Time: 05/21/2021/4:39:58 PM    Final      Assessment and Plan:   #LVOT Obstruction with Mild Late Systolic SAM: TTE demonstrates LVEF 60-65% with severe basal-septal hypertrophy with peak gradient 49mmHg that increases to 76mmhg with valsalva. Peak velocity 3.41m/s that peaks to 4.46m/s with velocity. Mild late-systolic SAM with mild MR. Mild AS. Prior TTEs in 2019 and 2017 with prominent septal hypertrophy but lower peak and mean gradients. Suspect there was overall progression of LVOTO with time and degree of obstruction was exacerbated by dehydration after patient was immobile in her chair for several days. Currently, she denies symptoms of chest pain, SOB, lightheadedness, syncope, palpitations or DOE although I worry she lacks insight into her  symptoms and is not very mobile at baseline. She was also notably evaluated by Cardiology in 2017 for syncope which was thought to be neurologic in nature. Not on tele currently. Will start BB at this time and encourage hydration. Can repeat TTE as out-patient to reassess gradient and adjust her medication as needed. May need monitor as well to assess for arrhythmias. -Start metoprolol 50mg  XL daily -Stop amlodipine for now; can up-titrate metop vs add dilt if needed -Encourage hydration as dehydration makes symptoms worse -Avoid pure vasodilators such as nitrates as exacerbates outflow tract obstruction -Will repeat TTE as outpatient to reassess gradient and adjust her medications accordingly -Consider cardiac monitor at follow-up to assess for arrhythmias  #HTN: -Change amlodipine to metop as above and up-titrate as tolerated  Risk Assessment/Risk Scores:          For questions or updates, please contact [facility] Please consult [email or web address] for contact info under    Signed, Freada Bergeron, MD  05/23/2021 10:16 AM

## 2021-05-23 NOTE — Progress Notes (Signed)
Patients husband is requesting for patient to go to [facility].

## 2021-05-23 NOTE — Discharge Instructions (Signed)

## 2021-05-23 NOTE — Progress Notes (Signed)
Home medicine retrieved from pharmacy to give back to patient.

## 2021-05-23 NOTE — Progress Notes (Signed)
Nsg Discharge Note  Admit Date:  05/20/2021 Discharge date: 05/23/2021   Etter Sjogren to be D/C'd Skilled nursing facility per MD order.  AVS completed.  Copy for chart, and copy for patient signed, and dated. Patient/caregiver able to verbalize understanding. IV removed. Discharge paper work placed in packet for receiving facility.  Discharge Medication: Allergies as of 05/23/2021       Reactions   Amoxicillin Other (See Comments)   Unknown reaction   Codeine    Unknown reaction   Penicillins    Long time ago . But no anaphylaxis        Medication List     STOP taking these medications    amLODipine 2.5 MG tablet Commonly known as: NORVASC   ketoconazole 2 % shampoo Commonly known as: NIZORAL       TAKE these medications    acetaminophen 325 MG tablet Commonly known as: TYLENOL Take 650 mg by mouth every 6 (six) hours as needed for headache.   apixaban 5 MG Tabs tablet Commonly known as: ELIQUIS Take 1 tablet (5 mg total) by mouth 2 (two) times daily. What changed: Another medication with the same name was removed. Continue taking this medication, and follow the directions you see here.   ascorbic acid 500 MG tablet Commonly known as: VITAMIN C Take 1 tablet (500 mg total) by mouth daily.   Gerhardt's butt cream Crea Apply to perineum/groin and affected areas twice daily   metoprolol succinate 50 MG 24 hr tablet Commonly known as: TOPROL-XL Take 1 tablet (50 mg total) by mouth daily. Take with or immediately following a meal. Start taking on: May 24, 2021   oxyCODONE 5 MG immediate release tablet Commonly known as: Oxy IR/ROXICODONE Take 1 tablet (5 mg total) by mouth every 6 (six) hours as needed for up to 3 days for severe pain (with wound care or dressing changes).   pantoprazole 40 MG tablet Commonly known as: Protonix Take 1 tablet (40 mg total) by mouth daily.   zinc sulfate 220 (50 Zn) MG capsule Take 1 capsule (220 mg total) by mouth  daily.        Discharge Assessment: Vitals:   05/23/21 1225 05/23/21 1419  BP: 140/64 137/64  Pulse: 90 79  Resp:  18  Temp:  98.4 F (36.9 C)  SpO2:  99%   Skin clean, dry and intact without evidence of skin break down, no evidence of skin tears noted. IV catheter discontinued intact. Site without signs and symptoms of complications - no redness or edema noted at insertion site, patient denies c/o pain - only slight tenderness at site.  Dressing with slight pressure applied.  D/c Instructions-Education: Discharge instructions given to patient/family with verbalized understanding. D/c education completed with patient/family including follow up instructions, medication list, d/c activities limitations if indicated, with other d/c instructions as indicated by MD - patient able to verbalize understanding, all questions fully answered. Patient instructed to return to ED, call 911, or call MD for any changes in condition.  Patient escorted via [place], and D/C home via private auto.  Zenaida Deed, RN 05/23/2021 5:57 PM

## 2021-05-23 NOTE — Discharge Summary (Signed)
Physician Discharge Summary  Tammy Terry MVE:[id number] DOB: 12-20-1940 DOA: 05/20/2021  PCP: Celene Squibb, MD Cardiology: [facility] (formerly [facility]) Admit date: 05/20/2021 Discharge date: 05/23/2021  Admitted From:  Home  Disposition:  SNF   Recommendations for Outpatient Follow-up:  Follow up with [facility] [place] office in 2-4 weeks for recheck Establish care with dermatologist in next 2-4 weeks regarding facial dermatitis Recommend obtaining outpatient palliative care consultation regarding goals of care  Discharge Condition: STABLE   CODE STATUS: FULL  DIET: heart healthy diet recommended    Brief Hospitalization Summary: Please see all hospital notes, images, labs for full details of the hospitalization. Brief Admission History:  80 y.o. adult, with history of osteoporosis, anxiety, cancer, essential hypertension, fatty liver, hyperlipidemia, hypertension, major depressive disorder, rosacea, and more presents ED with a chief complaint of generalized weakness.  She ws basically found to be immobile and found at home covered in urine and feces.  Family requesting SNF placement.    Assessment & Plan:   Principal Problem:   (HFpEF) heart failure with preserved ejection fraction/chronic Diastolic CHF/ EF 28-36%, ??  Outflow obstruction Active Problems:   Essential (primary) hypertension   Generalized weakness   Acute lower UTI   Pressure injury of skin   Acute metabolic encephalopathy   Yeast dermatitis   Acute metabolic encephalopathy - RESOLVED  - secondary to UTI  - treated UTI and continued supportive measures  - improved with treatments   UTI - TREATED   Generalized weakness  - multifactorial  - PT recommending need for SNF   Pressure injury of skin - continue skin care protocol as ordered   Yeast dermatitis  - treated with nystatin creme and oral fluconazole    History of DVT  - pt remains on apixaban for prevention of further clots - bleeding  precautions while on anticoagulant therapy    Diastolic heart failure with reported outflow obstruction  - cardiology was consulted, seen on 05/23/21 - pt started on metoprolol XL 50 mg daily, amlodipine discontinued.   -Start metoprolol 50mg  XL daily Recommendations from cardiology:  -Stop amlodipine for now; can up-titrate metop vs add dilt if needed -Encourage hydration as dehydration makes symptoms worse -Avoid pure vasodilators such as nitrates as exacerbates outflow tract obstruction -Will repeat TTE as outpatient to reassess gradient and adjust her medications accordingly -Consider cardiac monitor at follow-up to assess for arrhythmias - outpatient follow up with [facility] [place] office in 2-4 weeks recommended  Heart murmur  - likely due to aortic valve disease noted on recent Echo    DVT prophylaxis: apixaban  Code Status: full  Family Communication: patient, husband Disposition: anticipating SNF  Status is: Inpatient    Consultants:  Cardiology    Procedures:  IMPRESSIONS   1. Severe hypertrophy of the basal septum with otherwise moderate concentric hypertrophy. Basal septum seems to be causing outflow tract obstruction. Peak velocity 3.19 m/s, peak gradient 40.9 mmHg. Gradient increases 83.2 mmhg, velocity 4.56 m/s with Valsalva. There is mild SAM. Left ventricular ejection fraction, by estimation, is 60 to 65%. The left ventricle has normal function. The left ventricle has no regional wall motion abnormalities. Left ventricular diastolic parameters are consistent with Grade I diastolic dysfunction (impaired relaxation).   2. Right ventricular systolic function is normal. The right ventricular size is normal. There is normal pulmonary artery systolic pressure.   3. Left atrial size was moderately dilated.   4. The mitral valve is normal in structure. No evidence of mitral  valve regurgitation. No evidence of mitral stenosis.   5. Calcified aortic valve mostly affecting the  right coronary cusp. The aortic valve is calcified. Aortic valve regurgitation is not visualized.  Mild aortic valve stenosis. Aortic valve mean gradient measures 23.0 mmHg. Aortic valve Vmax measures 3.22 m/s.   6. The inferior vena cava is normal in size with greater than 50% respiratory variability, suggesting right atrial pressure of 3 mmHg.    Antimicrobials:  Ceftriaxone 11/26>>> 11/28 Discharge Diagnoses:  Principal Problem:   (HFpEF) heart failure with preserved ejection fraction/chronic Diastolic CHF/ EF 17-00%, ??  Outflow obstruction Active Problems:   Essential (primary) hypertension   Generalized weakness   Acute lower UTI   Pressure injury of skin   Acute metabolic encephalopathy   Yeast dermatitis   Discharge Instructions: Discharge Instructions     Ambulatory referral to Cardiology   Complete by: As directed    Hospital Follow Up   Ambulatory referral to Dermatology   Complete by: As directed       Allergies as of 05/23/2021       Reactions   Amoxicillin Other (See Comments)   Unknown reaction   Codeine    Unknown reaction   Penicillins    Long time ago . But no anaphylaxis        Medication List     STOP taking these medications    amLODipine 2.5 MG tablet Commonly known as: NORVASC   ketoconazole 2 % shampoo Commonly known as: NIZORAL       TAKE these medications    acetaminophen 325 MG tablet Commonly known as: TYLENOL Take 650 mg by mouth every 6 (six) hours as needed for headache.   apixaban 5 MG Tabs tablet Commonly known as: ELIQUIS Take 1 tablet (5 mg total) by mouth 2 (two) times daily. What changed: Another medication with the same name was removed. Continue taking this medication, and follow the directions you see here.   ascorbic acid 500 MG tablet Commonly known as: VITAMIN C Take 1 tablet (500 mg total) by mouth daily.   Gerhardt's butt cream Crea Apply to perineum/groin and affected areas twice daily   metoprolol  succinate 50 MG 24 hr tablet Commonly known as: TOPROL-XL Take 1 tablet (50 mg total) by mouth daily. Take with or immediately following a meal. Start taking on: May 24, 2021   oxyCODONE 5 MG immediate release tablet Commonly known as: Oxy IR/ROXICODONE Take 1 tablet (5 mg total) by mouth every 6 (six) hours as needed for up to 3 days for severe pain (with wound care or dressing changes).   pantoprazole 40 MG tablet Commonly known as: Protonix Take 1 tablet (40 mg total) by mouth daily.   zinc sulfate 220 (50 Zn) MG capsule Take 1 capsule (220 mg total) by mouth daily.        Contact information for follow-up providers     [place]. Schedule an appointment as soon as possible for a visit in 2 week(s).   Specialty: Cardiology Why: Hospital Follow Up Contact information: [place] [phone]        [place]. Schedule an appointment as soon as possible for a visit in 2 week(s).   Why: Please make appointment for facial dermatitis care.             Contact information for after-discharge care     [facility] .  Service: Skilled Nursing Contact information: [place] [address] [phone]                    Allergies  Allergen Reactions   Amoxicillin Other (See Comments)    Unknown reaction   Codeine     Unknown reaction   Penicillins     Long time ago . But no anaphylaxis   Allergies as of 05/23/2021       Reactions   Amoxicillin Other (See Comments)   Unknown reaction   Codeine    Unknown reaction   Penicillins    Long time ago . But no anaphylaxis        Medication List     STOP taking these medications    amLODipine 2.5 MG tablet Commonly known as: NORVASC   ketoconazole 2 % shampoo Commonly known as: NIZORAL       TAKE these medications    acetaminophen 325 MG  tablet Commonly known as: TYLENOL Take 650 mg by mouth every 6 (six) hours as needed for headache.   apixaban 5 MG Tabs tablet Commonly known as: ELIQUIS Take 1 tablet (5 mg total) by mouth 2 (two) times daily. What changed: Another medication with the same name was removed. Continue taking this medication, and follow the directions you see here.   ascorbic acid 500 MG tablet Commonly known as: VITAMIN C Take 1 tablet (500 mg total) by mouth daily.   Gerhardt's butt cream Crea Apply to perineum/groin and affected areas twice daily   metoprolol succinate 50 MG 24 hr tablet Commonly known as: TOPROL-XL Take 1 tablet (50 mg total) by mouth daily. Take with or immediately following a meal. Start taking on: May 24, 2021   oxyCODONE 5 MG immediate release tablet Commonly known as: Oxy IR/ROXICODONE Take 1 tablet (5 mg total) by mouth every 6 (six) hours as needed for up to 3 days for severe pain (with wound care or dressing changes).   pantoprazole 40 MG tablet Commonly known as: Protonix Take 1 tablet (40 mg total) by mouth daily.   zinc sulfate 220 (50 Zn) MG capsule Take 1 capsule (220 mg total) by mouth daily.        Procedures/Studies: DG Chest 2 View  Result Date: 05/20/2021 CLINICAL DATA:  Pain recent fall EXAM: CHEST - 2 VIEW COMPARISON:  01/31/2021 FINDINGS: The heart size and mediastinal contours are within normal limits. Both lungs are clear. The visualized skeletal structures are unremarkable. Surgical changes at the left axilla. Aortic atherosclerosis. Chronic appearing right-sided rib fractures. IMPRESSION: No active cardiopulmonary disease. Electronically Signed   By: Donavan Foil M.D.   On: 05/20/2021 18:36   DG Foot 2 Views Right  Result Date: 05/20/2021 CLINICAL DATA:  Foot pain recent fall EXAM: RIGHT FOOT - 2 VIEW COMPARISON:  01/24/2016 FINDINGS: Bones appear osteopenic which limits the exam. No malalignment. Degenerative changes at the first MTP  joint. Questionable fracture deformity at the neck of the fifth proximal phalanx on oblique view. IMPRESSION: Limited by osteopenia. Questionable fracture deformity at the neck of fifth proximal phalanx, correlate for point tenderness Electronically Signed   By: Donavan Foil M.D.   On: 05/20/2021 18:34   ECHOCARDIOGRAM COMPLETE  Result Date: 05/21/2021    ECHOCARDIOGRAM REPORT   Patient Name:   Etter Sjogren Date of Exam: 05/21/2021 Medical Rec #:  [id number]      Height:       66.0 in Accession #:  [id number]     Weight:       154.3 lb Date of Birth:  06-Mar-1941      BSA:          1.791 m Patient Age:    52 years       BP:           128/67 mmHg Patient Gender: F              HR:           82 bpm. Exam Location:  [facility] Procedure: 2D Echo, Cardiac Doppler and Color Doppler Indications:    Murmur R01.1  History:        Patient has prior history of Echocardiogram examinations, most                 recent 08/21/2017. Risk Factors:Hypertension and Dyslipidemia.                 Breast cancer, stage 3, Left mastectomy. COVID-19 virus                 infection.  Sonographer:    Alvino Chapel RCS Referring Phys: [id number] ASIA B Christine  1. Severe hypertrophy of the basal septum with otherwise moderate concentric hypertrophy. Basal septum seems to be causing outflow tract obstruction. Peak velocity 3.19 m/s, peak gradient 40.9 mmHg. Gradient increases 83.2 mmhg, velocity 4.56 m/s with Valsalva. There is mild SAM. Left ventricular ejection fraction, by estimation, is 60 to 65%. The left ventricle has normal function. The left ventricle has no regional wall motion abnormalities. Left ventricular diastolic parameters are consistent with Grade I diastolic dysfunction (impaired relaxation).  2. Right ventricular systolic function is normal. The right ventricular size is normal. There is normal pulmonary artery systolic pressure.  3. Left atrial size was moderately dilated.  4. The mitral valve is normal  in structure. No evidence of mitral valve regurgitation. No evidence of mitral stenosis.  5. Calcified aortic valve mostly affecting the right coronary cusp. The aortic valve is calcified. Aortic valve regurgitation is not visualized. Mild aortic valve stenosis. Aortic valve mean gradient measures 23.0 mmHg. Aortic valve Vmax measures 3.22 m/s.  6. The inferior vena cava is normal in size with greater than 50% respiratory variability, suggesting right atrial pressure of 3 mmHg. FINDINGS  Left Ventricle: Severe hypertrophy of the basal septum with otherwise moderate concentric hypertrophy. Basal septum seems to be causing outflow tract obstruction. Peak velocity 3.19 m/s, peak gradient 40.9 mmHg. Gradient increases 83.2 mmhg, velocity 4.56 m/s with Valsalva. There is mild SAM. Left ventricular ejection fraction, by estimation, is 60 to 65%. The left ventricle has normal function. The left ventricle has no regional wall motion abnormalities. The left ventricular internal cavity size was normal in size. There is no left ventricular hypertrophy. Left ventricular diastolic parameters are consistent with Grade I diastolic dysfunction (impaired relaxation). Right Ventricle: The right ventricular size is normal. No increase in right ventricular wall thickness. Right ventricular systolic function is normal. There is normal pulmonary artery systolic pressure. The tricuspid regurgitant velocity is 1.41 m/s, and  with an assumed right atrial pressure of 3 mmHg, the estimated right ventricular systolic pressure is 94.7 mmHg. Left Atrium: Left atrial size was moderately dilated. Right Atrium: Right atrial size was normal in size. Pericardium: There is no evidence of pericardial effusion. Mitral Valve: The mitral valve is normal in structure. No evidence of mitral valve regurgitation. No evidence of mitral valve stenosis. Tricuspid Valve:  The tricuspid valve is normal in structure. Tricuspid valve regurgitation is trivial. No  evidence of tricuspid stenosis. Aortic Valve: Calcified aortic valve mostly affecting the right coronary cusp. The aortic valve is calcified. Aortic valve regurgitation is not visualized. Mild aortic stenosis is present. Aortic valve mean gradient measures 23.0 mmHg. Aortic valve peak gradient measures 41.5 mmHg. Pulmonic Valve: The pulmonic valve was normal in structure. Pulmonic valve regurgitation is not visualized. No evidence of pulmonic stenosis. Aorta: The aortic root is normal in size and structure. Venous: The inferior vena cava is normal in size with greater than 50% respiratory variability, suggesting right atrial pressure of 3 mmHg. IAS/Shunts: No atrial level shunt detected by color flow Doppler.  LEFT VENTRICLE PLAX 2D LVIDd:         3.10 cm   Diastology LVIDs:         2.10 cm   LV e' medial:    5.55 cm/s LV PW:         1.40 cm   LV E/e' medial:  22.5 LV IVS:        1.60 cm   LV e' lateral:   5.44 cm/s LVOT diam:     1.60 cm   LV E/e' lateral: 23.0 LVOT Area:     2.01 cm  RIGHT VENTRICLE RV S prime:     16.00 cm/s TAPSE (M-mode): 2.4 cm LEFT ATRIUM             Index        RIGHT ATRIUM           Index LA diam:        4.00 cm 2.23 cm/m   RA Area:     12.20 cm LA Vol (A2C):   76.8 ml 42.88 ml/m  RA Volume:   26.80 ml  14.96 ml/m LA Vol (A4C):   46.0 ml 25.68 ml/m LA Biplane Vol: 60.3 ml 33.67 ml/m  AORTIC VALVE AV Vmax:      322.00 cm/s AV Vmean:     198.000 cm/s AV VTI:       0.646 m AV Peak Grad: 41.5 mmHg AV Mean Grad: 23.0 mmHg  AORTA Ao Root diam: 3.00 cm MITRAL VALVE                TRICUSPID VALVE MV Area (PHT): 2.22 cm     TR Peak grad:   8.0 mmHg MV Decel Time: 342 msec     TR Vmax:        141.00 cm/s MV E velocity: 125.00 cm/s MV A velocity: 181.00 cm/s  SHUNTS MV E/A ratio:  0.69         Systemic Diam: 1.60 cm Skeet Latch MD Electronically signed by Skeet Latch MD Signature Date/Time: 05/21/2021/4:39:58 PM    Final      Subjective: Pt without complaints, feeling much better  today, agreeable to SNF placement   Discharge Exam: Vitals:   05/23/21 0826 05/23/21 1225  BP: (!) 141/71 140/64  Pulse: 81 90  Resp:    Temp:    SpO2:     Vitals:   05/22/21 1928 05/23/21 0436 05/23/21 0826 05/23/21 1225  BP: 122/68 (!) 142/80 (!) 141/71 140/64  Pulse: 92 86 81 90  Resp: 18 15    Temp: 98.3 F (36.8 C) 98.2 F (36.8 C)    TempSrc: Oral Oral    SpO2: 100% 100%    Weight:      Height:  General exam: NAD. Appears calm and comfortable  Respiratory system: Clear to auscultation. Respiratory effort normal. Cardiovascular system: normal S1 & S2 heard. No JVD, 2/6 murmur heard, No rubs, gallops or clicks. Trace pedal edema. Gastrointestinal system: Abdomen is nondistended, soft and nontender. No organomegaly or masses felt. Normal bowel sounds heard. Central nervous system: Alert and oriented. No focal neurological deficits. Extremities: Symmetric 5 x 5 power. Skin: No rashes, lesions or ulcers Psychiatry: Judgement and insight appear poor. Mood & affect appropriate.   The results of significant diagnostics from this hospitalization (including imaging, microbiology, ancillary and laboratory) are listed below for reference.     Microbiology: Recent Results (from the past 240 hour(s))  Resp Panel by RT-PCR (Flu A&B, Covid) Nasopharyngeal Swab     Status: None   Collection Time: 05/20/21 10:20 PM   Specimen: Nasopharyngeal Swab; Nasopharyngeal(NP) swabs in vial transport medium  Result Value Ref Range Status   SARS Coronavirus 2 by RT PCR NEGATIVE NEGATIVE Final    Comment: (NOTE) SARS-CoV-2 target nucleic acids are NOT DETECTED.  The SARS-CoV-2 RNA is generally detectable in upper respiratory specimens during the acute phase of infection. The lowest concentration of SARS-CoV-2 viral copies this assay can detect is 138 copies/mL. A negative result does not preclude SARS-Cov-2 infection and should not be used as the sole basis for treatment or other  patient management decisions. A negative result may occur with  improper specimen collection/handling, submission of specimen other than nasopharyngeal swab, presence of viral mutation(s) within the areas targeted by this assay, and inadequate number of viral copies(<138 copies/mL). A negative result must be combined with clinical observations, patient history, and epidemiological information. The expected result is Negative.  Fact Sheet for Patients:  EntrepreneurPulse.com.au  Fact Sheet for Healthcare Providers:  IncredibleEmployment.be  This test is no t yet approved or cleared by the [place] FDA and  has been authorized for detection and/or diagnosis of SARS-CoV-2 by FDA under an Emergency Use Authorization (EUA). This EUA will remain  in effect (meaning this test can be used) for the duration of the COVID-19 declaration under Section 564(b)(1) of the Act, 21 U.S.C.section 360bbb-3(b)(1), unless the authorization is terminated  or revoked sooner.       Influenza A by PCR NEGATIVE NEGATIVE Final   Influenza B by PCR NEGATIVE NEGATIVE Final    Comment: (NOTE) The Xpert Xpress SARS-CoV-2/FLU/RSV plus assay is intended as an aid in the diagnosis of influenza from Nasopharyngeal swab specimens and should not be used as a sole basis for treatment. Nasal washings and aspirates are unacceptable for Xpert Xpress SARS-CoV-2/FLU/RSV testing.  Fact Sheet for Patients: EntrepreneurPulse.com.au  Fact Sheet for Healthcare Providers: IncredibleEmployment.be  This test is not yet approved or cleared by the [place] FDA and has been authorized for detection and/or diagnosis of SARS-CoV-2 by FDA under an Emergency Use Authorization (EUA). This EUA will remain in effect (meaning this test can be used) for the duration of the COVID-19 declaration under Section 564(b)(1) of the Act, 21 U.S.C. section  360bbb-3(b)(1), unless the authorization is terminated or revoked.  Performed at [facility], [address] [place], [place] [address]      Labs: BNP (last 3 results) No results for input(s): BNP in the last 8760 hours. Basic Metabolic Panel: Recent Labs  Lab 05/20/21 1717 05/21/21 0152 05/22/21 0841 05/23/21 0320  NA 140 140 138 139  K 3.7 3.3* 3.7 4.1  CL 109 112* 111 109  CO2 23 22 23  25  GLUCOSE 102* 103* 95 82  BUN 36* 29* 21 19  CREATININE 0.76 0.52 0.49 0.56  CALCIUM 9.4 8.5* 8.4* 8.7*  MG  --  1.8  --   --    Liver Function Tests: Recent Labs  Lab 05/20/21 1717 05/21/21 0152  AST 19 16  ALT 15 12  ALKPHOS 94 69  BILITOT 0.6 0.5  PROT 7.4 5.6*  ALBUMIN 3.9 2.9*   No results for input(s): LIPASE, AMYLASE in the last 168 hours. No results for input(s): AMMONIA in the last 168 hours. CBC: Recent Labs  Lab 05/20/21 1717 05/21/21 0152 05/23/21 0320  WBC 9.3 6.5 7.3  NEUTROABS 7.7 4.9  --   HGB 12.6 10.0* 10.2*  HCT 39.4 30.4* 32.6*  MCV 103.4* 102.4* 103.5*  PLT 186 152 122*   Cardiac Enzymes: No results for input(s): CKTOTAL, CKMB, CKMBINDEX, TROPONINI in the last 168 hours. BNP: Invalid input(s): POCBNP CBG: No results for input(s): GLUCAP in the last 168 hours. D-Dimer No results for input(s): DDIMER in the last 72 hours. Hgb A1c No results for input(s): HGBA1C in the last 72 hours. Lipid Profile No results for input(s): CHOL, HDL, LDLCALC, TRIG, CHOLHDL, LDLDIRECT in the last 72 hours. Thyroid function studies No results for input(s): TSH, T4TOTAL, T3FREE, THYROIDAB in the last 72 hours.  Invalid input(s): FREET3 Anemia work up No results for input(s): VITAMINB12, FOLATE, FERRITIN, TIBC, IRON, RETICCTPCT in the last 72 hours. Urinalysis    Component Value Date/Time   COLORURINE YELLOW 05/20/2021 1834   APPEARANCEUR HAZY (A) 05/20/2021 1834   LABSPEC >1.030 (H) 05/20/2021 1834   PHURINE 5.5 05/20/2021 1834   GLUCOSEU NEGATIVE  05/20/2021 1834   HGBUR SMALL (A) 05/20/2021 1834   BILIRUBINUR NEGATIVE 05/20/2021 1834   KETONESUR NEGATIVE 05/20/2021 1834   PROTEINUR NEGATIVE 05/20/2021 1834   NITRITE NEGATIVE 05/20/2021 1834   LEUKOCYTESUR MODERATE (A) 05/20/2021 1834   Sepsis Labs Invalid input(s): PROCALCITONIN,  WBC,  LACTICIDVEN Microbiology Recent Results (from the past 240 hour(s))  Resp Panel by RT-PCR (Flu A&B, Covid) Nasopharyngeal Swab     Status: None   Collection Time: 05/20/21 10:20 PM   Specimen: Nasopharyngeal Swab; Nasopharyngeal(NP) swabs in vial transport medium  Result Value Ref Range Status   SARS Coronavirus 2 by RT PCR NEGATIVE NEGATIVE Final    Comment: (NOTE) SARS-CoV-2 target nucleic acids are NOT DETECTED.  The SARS-CoV-2 RNA is generally detectable in upper respiratory specimens during the acute phase of infection. The lowest concentration of SARS-CoV-2 viral copies this assay can detect is 138 copies/mL. A negative result does not preclude SARS-Cov-2 infection and should not be used as the sole basis for treatment or other patient management decisions. A negative result may occur with  improper specimen collection/handling, submission of specimen other than nasopharyngeal swab, presence of viral mutation(s) within the areas targeted by this assay, and inadequate number of viral copies(<138 copies/mL). A negative result must be combined with clinical observations, patient history, and epidemiological information. The expected result is Negative.  Fact Sheet for Patients:  EntrepreneurPulse.com.au  Fact Sheet for Healthcare Providers:  IncredibleEmployment.be  This test is no t yet approved or cleared by the [place] FDA and  has been authorized for detection and/or diagnosis of SARS-CoV-2 by FDA under an Emergency Use Authorization (EUA). This EUA will remain  in effect (meaning this test can be used) for the duration of the COVID-19  declaration under Section 564(b)(1) of the Act, 21 U.S.C.section 360bbb-3(b)(1), unless the authorization is  terminated  or revoked sooner.       Influenza A by PCR NEGATIVE NEGATIVE Final   Influenza B by PCR NEGATIVE NEGATIVE Final    Comment: (NOTE) The Xpert Xpress SARS-CoV-2/FLU/RSV plus assay is intended as an aid in the diagnosis of influenza from Nasopharyngeal swab specimens and should not be used as a sole basis for treatment. Nasal washings and aspirates are unacceptable for Xpert Xpress SARS-CoV-2/FLU/RSV testing.  Fact Sheet for Patients: EntrepreneurPulse.com.au  Fact Sheet for Healthcare Providers: IncredibleEmployment.be  This test is not yet approved or cleared by the [place] FDA and has been authorized for detection and/or diagnosis of SARS-CoV-2 by FDA under an Emergency Use Authorization (EUA). This EUA will remain in effect (meaning this test can be used) for the duration of the COVID-19 declaration under Section 564(b)(1) of the Act, 21 U.S.C. section 360bbb-3(b)(1), unless the authorization is terminated or revoked.  Performed at [facility], [address], [place], [place] [address]    Time coordinating discharge:  39 mins   SIGNED:  Irwin Brakeman, MD  [facility] 05/23/2021, 1:18 PM How to contact the [facility] Attending or Consulting provider [place] or covering provider during after hours [place], for this patient?  Check the care team in [facility] and look for a) attending/consulting TRH provider listed and b) the [facility] team listed Log into [email or web address] and use [place]'s universal password to access. If you do not have the password, please contact the hospital operator. Locate the [facility] provider you are looking for under [facility] and page to a number that you can be directly reached. If you still have difficulty reaching the provider, please page the [facility] (Director on Call) for the Hospitalists  listed on amion for assistance.

## 2021-05-23 NOTE — Progress Notes (Addendum)
Report called to [facility]. Spoke with Elnoria Howard receiving nurse. Social worker has called EMS. Awaiting for transport.

## 2021-05-23 NOTE — TOC Transition Note (Addendum)
Transition of Care [facility]) - CM/SW Discharge Note   Patient Details  Name: Tammy Terry MRN: [id number] Date of Birth: 07/25/1940  Transition of Care [facility] CM/SW Contact:  Natasha Bence, LCSW Phone Number: 05/23/2021, 1:03 PM   Clinical Narrative:    CSW notified of patient's readiness for discharge. CSW notified patient that [facility] not able to make a bed offer. Patient agreeable to [facility] [facility]. Debbie with [facility] agreeable to take the patient. CSW added [facility] to [place]. CSW faxed discharge summary completed med necessity and called [facility] Nurse to call report. TOC signing off.    Final next level of care: Skilled Nursing Facility Barriers to Discharge: Barriers Resolved   Patient Goals and CMS Choice Patient states their goals for this hospitalization and ongoing recovery are:: Rehab with SNF CMS Medicare.gov Compare Post Acute Care list provided to:: Patient Choice offered to / list presented to : Patient  Discharge Placement              Patient chooses bed at: [facility] Patient to be transferred to facility by: [facility] Name of family member notified: Helana, Macbride (Spouse)   [phone] Patient and family notified of of transfer: 05/23/21  Discharge Plan and Services                                     Social Determinants of Health (SDOH) Interventions     Readmission Risk Interventions No flowsheet data found.

## 2021-05-23 NOTE — Consult Note (Signed)
[place] Nurse wound consult note Consultation was completed by review of records, images and assistance from the bedside nurse/clinical staff.  Reason for Consult: skin breakdown Wound type:  MASD (moisture associated skin damage) from incontinence of bowl and bladder Pressure Injury POA: NA Wound bed: red; with possible superficial skin peeling Drainage (amount, consistency, odor) none; skin intact  Periwound:intact  Dressing procedure/placement/frequency:  Add Gehardt's butt cream Add low air loss mattress for moisture management   Discussed POC with bedside nurse.  Re consult if needed, will not follow at this time. Thanks  Kanan Sobek [employer], RN,CWOCN, CNS, [place] [phone])

## 2021-05-23 NOTE — Care Management Important Message (Signed)
Important Message  Patient Details  Name: Tammy Terry MRN: [id number] Date of Birth: 1940-11-08   Medicare Important Message Given:  Yes     Tommy Medal 05/23/2021, 1:48 PM

## 2021-05-26 DIAGNOSIS — I5030 Unspecified diastolic (congestive) heart failure: Secondary | ICD-10-CM | POA: Diagnosis not present

## 2021-05-26 DIAGNOSIS — E785 Hyperlipidemia, unspecified: Secondary | ICD-10-CM | POA: Diagnosis not present

## 2021-05-26 DIAGNOSIS — R5381 Other malaise: Secondary | ICD-10-CM | POA: Diagnosis not present

## 2021-05-26 DIAGNOSIS — K219 Gastro-esophageal reflux disease without esophagitis: Secondary | ICD-10-CM | POA: Diagnosis not present

## 2021-05-26 DIAGNOSIS — G8929 Other chronic pain: Secondary | ICD-10-CM | POA: Diagnosis not present

## 2021-05-26 DIAGNOSIS — I10 Essential (primary) hypertension: Secondary | ICD-10-CM | POA: Diagnosis not present

## 2021-05-26 DIAGNOSIS — Z87440 Personal history of urinary (tract) infections: Secondary | ICD-10-CM | POA: Diagnosis not present

## 2021-05-26 DIAGNOSIS — R531 Weakness: Secondary | ICD-10-CM | POA: Diagnosis not present

## 2021-05-30 DIAGNOSIS — Z7409 Other reduced mobility: Secondary | ICD-10-CM | POA: Diagnosis not present

## 2021-05-30 DIAGNOSIS — K219 Gastro-esophageal reflux disease without esophagitis: Secondary | ICD-10-CM | POA: Diagnosis not present

## 2021-05-30 DIAGNOSIS — D649 Anemia, unspecified: Secondary | ICD-10-CM | POA: Diagnosis not present

## 2021-05-30 DIAGNOSIS — E785 Hyperlipidemia, unspecified: Secondary | ICD-10-CM | POA: Diagnosis not present

## 2021-05-30 DIAGNOSIS — G8929 Other chronic pain: Secondary | ICD-10-CM | POA: Diagnosis not present

## 2021-05-30 DIAGNOSIS — I10 Essential (primary) hypertension: Secondary | ICD-10-CM | POA: Diagnosis not present

## 2021-05-30 DIAGNOSIS — R5381 Other malaise: Secondary | ICD-10-CM | POA: Diagnosis not present

## 2021-05-30 DIAGNOSIS — Z87440 Personal history of urinary (tract) infections: Secondary | ICD-10-CM | POA: Diagnosis not present

## 2021-06-06 DIAGNOSIS — B372 Candidiasis of skin and nail: Secondary | ICD-10-CM | POA: Diagnosis not present

## 2021-06-06 DIAGNOSIS — M6281 Muscle weakness (generalized): Secondary | ICD-10-CM | POA: Diagnosis not present

## 2021-06-06 DIAGNOSIS — M810 Age-related osteoporosis without current pathological fracture: Secondary | ICD-10-CM | POA: Diagnosis not present

## 2021-06-06 DIAGNOSIS — I422 Other hypertrophic cardiomyopathy: Secondary | ICD-10-CM | POA: Diagnosis not present

## 2021-06-06 DIAGNOSIS — I5032 Chronic diastolic (congestive) heart failure: Secondary | ICD-10-CM | POA: Diagnosis not present

## 2021-06-09 DIAGNOSIS — R262 Difficulty in walking, not elsewhere classified: Secondary | ICD-10-CM | POA: Diagnosis not present

## 2021-06-09 DIAGNOSIS — M6281 Muscle weakness (generalized): Secondary | ICD-10-CM | POA: Diagnosis not present

## 2021-06-09 DIAGNOSIS — R41841 Cognitive communication deficit: Secondary | ICD-10-CM | POA: Diagnosis not present

## 2021-06-09 DIAGNOSIS — I5032 Chronic diastolic (congestive) heart failure: Secondary | ICD-10-CM | POA: Diagnosis not present

## 2021-06-10 DIAGNOSIS — R262 Difficulty in walking, not elsewhere classified: Secondary | ICD-10-CM | POA: Diagnosis not present

## 2021-06-10 DIAGNOSIS — M6281 Muscle weakness (generalized): Secondary | ICD-10-CM | POA: Diagnosis not present

## 2021-06-10 DIAGNOSIS — I5032 Chronic diastolic (congestive) heart failure: Secondary | ICD-10-CM | POA: Diagnosis not present

## 2021-06-10 DIAGNOSIS — R41841 Cognitive communication deficit: Secondary | ICD-10-CM | POA: Diagnosis not present

## 2021-06-14 DIAGNOSIS — R41841 Cognitive communication deficit: Secondary | ICD-10-CM | POA: Diagnosis not present

## 2021-06-14 DIAGNOSIS — I5032 Chronic diastolic (congestive) heart failure: Secondary | ICD-10-CM | POA: Diagnosis not present

## 2021-06-14 DIAGNOSIS — R262 Difficulty in walking, not elsewhere classified: Secondary | ICD-10-CM | POA: Diagnosis not present

## 2021-06-14 DIAGNOSIS — M6281 Muscle weakness (generalized): Secondary | ICD-10-CM | POA: Diagnosis not present

## 2021-06-15 DIAGNOSIS — R262 Difficulty in walking, not elsewhere classified: Secondary | ICD-10-CM | POA: Diagnosis not present

## 2021-06-15 DIAGNOSIS — I5032 Chronic diastolic (congestive) heart failure: Secondary | ICD-10-CM | POA: Diagnosis not present

## 2021-06-15 DIAGNOSIS — R41841 Cognitive communication deficit: Secondary | ICD-10-CM | POA: Diagnosis not present

## 2021-06-15 DIAGNOSIS — M6281 Muscle weakness (generalized): Secondary | ICD-10-CM | POA: Diagnosis not present

## 2021-06-16 DIAGNOSIS — R262 Difficulty in walking, not elsewhere classified: Secondary | ICD-10-CM | POA: Diagnosis not present

## 2021-06-16 DIAGNOSIS — I5032 Chronic diastolic (congestive) heart failure: Secondary | ICD-10-CM | POA: Diagnosis not present

## 2021-06-16 DIAGNOSIS — M6281 Muscle weakness (generalized): Secondary | ICD-10-CM | POA: Diagnosis not present

## 2021-06-16 DIAGNOSIS — R41841 Cognitive communication deficit: Secondary | ICD-10-CM | POA: Diagnosis not present

## 2021-06-17 DIAGNOSIS — I5032 Chronic diastolic (congestive) heart failure: Secondary | ICD-10-CM | POA: Diagnosis not present

## 2021-06-17 DIAGNOSIS — R41841 Cognitive communication deficit: Secondary | ICD-10-CM | POA: Diagnosis not present

## 2021-06-17 DIAGNOSIS — R262 Difficulty in walking, not elsewhere classified: Secondary | ICD-10-CM | POA: Diagnosis not present

## 2021-06-17 DIAGNOSIS — M6281 Muscle weakness (generalized): Secondary | ICD-10-CM | POA: Diagnosis not present

## 2021-06-20 DIAGNOSIS — R41841 Cognitive communication deficit: Secondary | ICD-10-CM | POA: Diagnosis not present

## 2021-06-20 DIAGNOSIS — R262 Difficulty in walking, not elsewhere classified: Secondary | ICD-10-CM | POA: Diagnosis not present

## 2021-06-20 DIAGNOSIS — M6281 Muscle weakness (generalized): Secondary | ICD-10-CM | POA: Diagnosis not present

## 2021-06-20 DIAGNOSIS — I5032 Chronic diastolic (congestive) heart failure: Secondary | ICD-10-CM | POA: Diagnosis not present

## 2021-06-21 DIAGNOSIS — I5032 Chronic diastolic (congestive) heart failure: Secondary | ICD-10-CM | POA: Diagnosis not present

## 2021-06-21 DIAGNOSIS — M6281 Muscle weakness (generalized): Secondary | ICD-10-CM | POA: Diagnosis not present

## 2021-06-21 DIAGNOSIS — R262 Difficulty in walking, not elsewhere classified: Secondary | ICD-10-CM | POA: Diagnosis not present

## 2021-06-21 DIAGNOSIS — R41841 Cognitive communication deficit: Secondary | ICD-10-CM | POA: Diagnosis not present

## 2021-06-22 DIAGNOSIS — R41841 Cognitive communication deficit: Secondary | ICD-10-CM | POA: Diagnosis not present

## 2021-06-22 DIAGNOSIS — I5032 Chronic diastolic (congestive) heart failure: Secondary | ICD-10-CM | POA: Diagnosis not present

## 2021-06-22 DIAGNOSIS — M6281 Muscle weakness (generalized): Secondary | ICD-10-CM | POA: Diagnosis not present

## 2021-06-22 DIAGNOSIS — R262 Difficulty in walking, not elsewhere classified: Secondary | ICD-10-CM | POA: Diagnosis not present

## 2021-06-23 DIAGNOSIS — F411 Generalized anxiety disorder: Secondary | ICD-10-CM | POA: Diagnosis not present

## 2021-06-23 DIAGNOSIS — M6281 Muscle weakness (generalized): Secondary | ICD-10-CM | POA: Diagnosis not present

## 2021-06-23 DIAGNOSIS — F4323 Adjustment disorder with mixed anxiety and depressed mood: Secondary | ICD-10-CM | POA: Diagnosis not present

## 2021-06-23 DIAGNOSIS — F331 Major depressive disorder, recurrent, moderate: Secondary | ICD-10-CM | POA: Diagnosis not present

## 2021-06-23 DIAGNOSIS — R262 Difficulty in walking, not elsewhere classified: Secondary | ICD-10-CM | POA: Diagnosis not present

## 2021-06-23 DIAGNOSIS — R41841 Cognitive communication deficit: Secondary | ICD-10-CM | POA: Diagnosis not present

## 2021-06-23 DIAGNOSIS — I5032 Chronic diastolic (congestive) heart failure: Secondary | ICD-10-CM | POA: Diagnosis not present

## 2021-06-24 DIAGNOSIS — I5032 Chronic diastolic (congestive) heart failure: Secondary | ICD-10-CM | POA: Diagnosis not present

## 2021-06-24 DIAGNOSIS — R41841 Cognitive communication deficit: Secondary | ICD-10-CM | POA: Diagnosis not present

## 2021-06-24 DIAGNOSIS — M6281 Muscle weakness (generalized): Secondary | ICD-10-CM | POA: Diagnosis not present

## 2021-06-24 DIAGNOSIS — R262 Difficulty in walking, not elsewhere classified: Secondary | ICD-10-CM | POA: Diagnosis not present

## 2021-06-27 DIAGNOSIS — I5032 Chronic diastolic (congestive) heart failure: Secondary | ICD-10-CM | POA: Diagnosis not present

## 2021-06-27 DIAGNOSIS — M6281 Muscle weakness (generalized): Secondary | ICD-10-CM | POA: Diagnosis not present

## 2021-06-27 DIAGNOSIS — R262 Difficulty in walking, not elsewhere classified: Secondary | ICD-10-CM | POA: Diagnosis not present

## 2021-06-27 DIAGNOSIS — R41841 Cognitive communication deficit: Secondary | ICD-10-CM | POA: Diagnosis not present

## 2021-06-28 DIAGNOSIS — R262 Difficulty in walking, not elsewhere classified: Secondary | ICD-10-CM | POA: Diagnosis not present

## 2021-06-28 DIAGNOSIS — M6281 Muscle weakness (generalized): Secondary | ICD-10-CM | POA: Diagnosis not present

## 2021-06-28 DIAGNOSIS — I5032 Chronic diastolic (congestive) heart failure: Secondary | ICD-10-CM | POA: Diagnosis not present

## 2021-06-28 DIAGNOSIS — R41841 Cognitive communication deficit: Secondary | ICD-10-CM | POA: Diagnosis not present

## 2021-06-29 DIAGNOSIS — M6281 Muscle weakness (generalized): Secondary | ICD-10-CM | POA: Diagnosis not present

## 2021-06-29 DIAGNOSIS — R262 Difficulty in walking, not elsewhere classified: Secondary | ICD-10-CM | POA: Diagnosis not present

## 2021-06-29 DIAGNOSIS — R41841 Cognitive communication deficit: Secondary | ICD-10-CM | POA: Diagnosis not present

## 2021-06-29 DIAGNOSIS — I5032 Chronic diastolic (congestive) heart failure: Secondary | ICD-10-CM | POA: Diagnosis not present

## 2021-06-30 DIAGNOSIS — R41841 Cognitive communication deficit: Secondary | ICD-10-CM | POA: Diagnosis not present

## 2021-06-30 DIAGNOSIS — I5032 Chronic diastolic (congestive) heart failure: Secondary | ICD-10-CM | POA: Diagnosis not present

## 2021-06-30 DIAGNOSIS — R262 Difficulty in walking, not elsewhere classified: Secondary | ICD-10-CM | POA: Diagnosis not present

## 2021-06-30 DIAGNOSIS — M6281 Muscle weakness (generalized): Secondary | ICD-10-CM | POA: Diagnosis not present

## 2021-07-01 DIAGNOSIS — R41841 Cognitive communication deficit: Secondary | ICD-10-CM | POA: Diagnosis not present

## 2021-07-01 DIAGNOSIS — I5032 Chronic diastolic (congestive) heart failure: Secondary | ICD-10-CM | POA: Diagnosis not present

## 2021-07-01 DIAGNOSIS — F411 Generalized anxiety disorder: Secondary | ICD-10-CM | POA: Diagnosis not present

## 2021-07-01 DIAGNOSIS — M6281 Muscle weakness (generalized): Secondary | ICD-10-CM | POA: Diagnosis not present

## 2021-07-01 DIAGNOSIS — F331 Major depressive disorder, recurrent, moderate: Secondary | ICD-10-CM | POA: Diagnosis not present

## 2021-07-01 DIAGNOSIS — R262 Difficulty in walking, not elsewhere classified: Secondary | ICD-10-CM | POA: Diagnosis not present

## 2021-07-01 DIAGNOSIS — F4323 Adjustment disorder with mixed anxiety and depressed mood: Secondary | ICD-10-CM | POA: Diagnosis not present

## 2021-07-03 NOTE — Progress Notes (Signed)
Cardiology Office Note:    Date:  07/06/2021   ID:  Tammy Terry, DOB 10-13-1940, MRN [id number]  PCP:  Celene Squibb, MD  Cardiologist:  None  Electrophysiologist:  None   Referring MD: Murlean Iba, MD   Chief Complaint  Patient presents with   Cardiomyopathy    History of Present Illness:    Tammy Terry is a 81 y.o. adult with a hx of hypertension, breast cancer, hyperlipidemia, chronic diastolic heart failure who presents for hospital follow-up.  She was admitted to [facility] from 11/25 through 05/23/2021 after presenting with weakness and encephalopathy, thought to be secondary to UTI.  Echocardiogram on 05/21/2021 showed severe hypertrophy of basal septum with SAM and LVOT peak gradient 41 mmHg, increased to 83 mmHg with Valsalva, mild AS.  She was seen by cardiology, felt that LVOT obstruction was worsened by dehydration.  She was started on beta-blocker and encouraged to stay hydrated, with plan for repeat echocardiogram as outpatient.  Since discharge from the hospital, she reports that she is doing well. Denies any chest pain, dyspnea, lightheadedness, syncope, lower extremity edema, or palpitations.   Past Medical History:  Diagnosis Date   Acquired absence of unspecified breast and nipple    Age-related osteoporosis without current pathological fracture    Age-related osteoporosis without current pathological fracture    Anxiety    Anxiety disorder, unspecified    Cancer ([place]) 1994   BREAST CANCER, TREATED WITH RADIATION AND CHEMOTHERAPY   Depression with anxiety 03/01/2012   Essential (primary) hypertension    Fatty (change of) liver, not elsewhere classified    Fatty liver    H/O vitamin D deficiency    Hyperlipidemia 03/01/2012   Hypertension    Insomnia, unspecified    Localized edema    Lumbago    Major depressive disorder, single episode, unspecified    Malignant neoplasm of unspecified site of unspecified female breast ([place])    Osteoporosis     Other abnormalities of gait and mobility    Polyosteoarthritis, unspecified    Radiculopathy, site unspecified    Rosacea    Syncope and collapse    Tremor, unspecified    Unspecified convulsions ([place])    Unspecified fall, initial encounter     Past Surgical History:  Procedure Laterality Date   [place]   LEFT MASTECTOMY    CAPSULOTOMY  august 2015   of left eye   CATARACT EXTRACTION Left    CESAREAN SECTION     COLONOSCOPY N/A 05/13/2014   Procedure: COLONOSCOPY;  Surgeon: Rogene Houston, MD;  Location: [facility];  Service: Endoscopy;  Laterality: N/A;  930   MASTECTOMY     left breast for cancer in 1994   TONSILLECTOMY      Current Medications: Current Meds  Medication Sig   acetaminophen (TYLENOL) 325 MG tablet Take 650 mg by mouth every 6 (six) hours as needed for headache.   apixaban (ELIQUIS) 5 MG TABS tablet Take 1 tablet (5 mg total) by mouth 2 (two) times daily.   ascorbic acid (VITAMIN C) 500 MG tablet Take 1 tablet (500 mg total) by mouth daily.   metoprolol succinate (TOPROL-XL) 50 MG 24 hr tablet Take 1 tablet (50 mg total) by mouth daily. Take with or immediately following a meal.   Nystatin (GERHARDT'S BUTT CREAM) CREA Apply to perineum/groin and affected areas twice daily   pantoprazole (PROTONIX) 40 MG tablet Take 1 tablet (40 mg total) by mouth  daily.   zinc sulfate 220 (50 Zn) MG capsule Take 1 capsule (220 mg total) by mouth daily.     Allergies:   Amoxicillin, Codeine, and Penicillins   Social History   Socioeconomic History   Marital status: Married    Spouse name: Not on file   Number of children: 1   Years of education: college   Highest education level: Not on file  Occupational History   Occupation: Retired  Tobacco Use   Smoking status: Never   Smokeless tobacco: Never  Vaping Use   Vaping Use: Never used  Substance and Sexual Activity   Alcohol use: No    Alcohol/week: 0.0 standard drinks   Drug use: No   Sexual  activity: Never  Other Topics Concern   Not on file  Social History Narrative   Denies caffeine use    Social Determinants of Health   Financial Resource Strain: Not on file  Food Insecurity: Not on file  Transportation Needs: Not on file  Physical Activity: Not on file  Stress: Not on file  Social Connections: Not on file     Family History: The patient's family history includes Diabetes in his paternal grandmother; Heart attack in his father.  ROS:   Please see the history of present illness.     All other systems reviewed and are negative.  EKGs/Labs/Other Studies Reviewed:    The following studies were reviewed today:   EKG:  EKG is not ordered today.  The ekg ordered 05/21/21 shows LVH, NSR, rate 80  Recent Labs: 05/21/2021: ALT 12; Magnesium 1.8 05/23/2021: BUN 19; Creatinine, Ser 0.56; Potassium 4.1; Sodium 139 07/06/2021: Hemoglobin 10.8; Platelets 158  Recent Lipid Panel    Component Value Date/Time   CHOL 211 (A) 12/12/2018 0000   TRIG 117 12/12/2018 0000   HDL 72 (A) 12/12/2018 0000   LDLCALC 116 12/12/2018 0000    Physical Exam:    VS:  BP 140/70    Pulse 64    Ht 5\' 6"  (1.676 m)    Wt 138 lb (62.6 kg)    SpO2 99%    BMI 22.27 kg/m     Wt Readings from Last 3 Encounters:  07/06/21 138 lb (62.6 kg)  05/20/21 154 lb 5.2 oz (70 kg)  01/31/21 154 lb 5.2 oz (70 kg)     GEN:  in no acute distress HEENT: Normal NECK: No JVD; No carotid bruits CARDIAC: RRR, 2/6 systolic murmur RESPIRATORY:  Clear to auscultation without rales, wheezing or rhonchi  ABDOMEN: Soft, non-tender, non-distended MUSCULOSKELETAL:  No edema; No deformity  SKIN: Warm and dry NEUROLOGIC:  Alert and oriented x 3 PSYCHIATRIC:  Normal affect   ASSESSMENT:    1. LVH (left ventricular hypertrophy)   2. Aortic valve stenosis, etiology of cardiac valve disease unspecified    PLAN:    LVH: Echocardiogram on 05/21/2021 showed severe hypertrophy of basal septum with SAM and LVOT  peak gradient 41 mmHg, increased to 83 mmHg with Valsalva, mild AS.  She was seen by cardiology, felt that LVOT obstruction was worsened by dehydration.  She was started on beta-blocker and encouraged to stay hydrated, with plan for repeat echocardiogram as outpatient. -Check MRI to evaluate for HCM versus amyloidosis -Continue Toprol-XL 50 mg daily -Encouraged to stay hydrated -Repeat echocardiogram to evaluate obstruction  DVT: On apixaban  AS: mild on echo, will monitor  RTC in 3 months   Medication Adjustments/Labs and Tests Ordered: Current medicines are reviewed at length  with the patient today.  Concerns regarding medicines are outlined above.  Orders Placed This Encounter  Procedures   MR CARDIAC MORPHOLOGY W WO CONTRAST   CBC   ECHOCARDIOGRAM COMPLETE   No orders of the defined types were placed in this encounter.   Patient Instructions  Medication Instructions:  Your physician recommends that you continue on your current medications as directed. Please refer to the Current Medication list given to you today.  *If you need a refill on your cardiac medications before your next appointment, please call your pharmacy*   Lab Work: CBC If you have labs (blood work) drawn today and your tests are completely normal, you will receive your results only by: [place] (if you have MyChart) OR A paper copy in the mail If you have any lab test that is abnormal or we need to change your treatment, we will call you to review the results.   Testing/Procedures: Caridac MRI Your physician has requested that you have an echocardiogram. Echocardiography is a painless test that uses sound waves to create images of your heart. It provides your doctor with information about the size and shape of your heart and how well your hearts chambers and valves are working. This procedure takes approximately one hour. There are no restrictions for this procedure.    Follow-Up: At [facility], you and your health needs are our priority.  As part of our continuing mission to provide you with exceptional heart care, we have created designated Provider Care Teams.  These Care Teams include your primary Cardiologist (physician) and Advanced Practice Providers (APPs -  Physician Assistants and Nurse Practitioners) who all work together to provide you with the care you need, when you need it.  We recommend signing up for the patient portal called "MyChart".  Sign up information is provided on this After Visit Summary.  MyChart is used to connect with patients for Virtual Visits (Telemedicine).  Patients are able to view lab/test results, encounter notes, upcoming appointments, etc.  Non-urgent messages can be sent to your provider as well.   To learn more about what you can do with MyChart, go to [email or web address].    Your next appointment:   3 month(s)  The format for your next appointment:   In Person  Provider:   Oswaldo Milian, MD     Other Instructions   You are scheduled for Cardiac MRI on ______________. Please arrive at the [facility] main entrance of [facility] at ________________ (30-45 minutes prior to test start time). ?  [facility] [address] [place], [place] [address] [phone]  Please take advantage of the free valet parking available at the MAIN entrance (A entrance). Proceed to the [facility] Radiology Department (First Floor). ? Magnetic resonance imaging (MRI) is a painless test that produces images of the inside of the body without using Xrays.  During an MRI, strong magnets and radio waves work together in a Research officer, political party to form detailed images.   MRI images may provide more details about a medical condition than X-rays, CT scans, and ultrasounds can provide.  You may be given earphones to listen for instructions.  You may eat a light breakfast and take medications as ordered with the exception of  HCTZ (fluid pill, other). Please avoid stimulants for 12 hr prior to test. (Ie. Caffeine, nicotine, chocolate, or antihistamine medications)  If a contrast material will be used, an IV will be inserted into one of your veins.  Contrast material will be injected into your IV. It will leave your body through your urine within a day. You may be told to drink plenty of fluids to help flush the contrast material out of your system.  You will be asked to remove all metal, including: Watch, jewelry, and other metal objects including hearing aids, hair pieces and dentures. Also wearable glucose monitoring systems (ie. Freestyle Libre and Omnipods) (Braces and fillings normally are not a problem.)   TEST WILL TAKE APPROXIMATELY 1 HOUR  PLEASE NOTIFY SCHEDULING AT LEAST 24 HOURS IN ADVANCE IF YOU ARE UNABLE TO KEEP YOUR APPOINTMENT. [phone]  Please call Marchia Bond, cardiac imaging nurse navigator with any questions/concerns. Marchia Bond RN Navigator Cardiac Imaging Gordy Clement RN Navigator Cardiac Imaging [facility] Heart and Vascular Services [phone] Office      Signed, Donato Heinz, MD  07/06/2021 5:39 PM    [place]

## 2021-07-04 DIAGNOSIS — M6281 Muscle weakness (generalized): Secondary | ICD-10-CM | POA: Diagnosis not present

## 2021-07-04 DIAGNOSIS — I5030 Unspecified diastolic (congestive) heart failure: Secondary | ICD-10-CM | POA: Diagnosis not present

## 2021-07-04 DIAGNOSIS — I5032 Chronic diastolic (congestive) heart failure: Secondary | ICD-10-CM | POA: Diagnosis not present

## 2021-07-04 DIAGNOSIS — K219 Gastro-esophageal reflux disease without esophagitis: Secondary | ICD-10-CM | POA: Diagnosis not present

## 2021-07-04 DIAGNOSIS — E785 Hyperlipidemia, unspecified: Secondary | ICD-10-CM | POA: Diagnosis not present

## 2021-07-04 DIAGNOSIS — R5381 Other malaise: Secondary | ICD-10-CM | POA: Diagnosis not present

## 2021-07-04 DIAGNOSIS — R262 Difficulty in walking, not elsewhere classified: Secondary | ICD-10-CM | POA: Diagnosis not present

## 2021-07-04 DIAGNOSIS — G8929 Other chronic pain: Secondary | ICD-10-CM | POA: Diagnosis not present

## 2021-07-04 DIAGNOSIS — R638 Other symptoms and signs concerning food and fluid intake: Secondary | ICD-10-CM | POA: Diagnosis not present

## 2021-07-04 DIAGNOSIS — R41841 Cognitive communication deficit: Secondary | ICD-10-CM | POA: Diagnosis not present

## 2021-07-04 DIAGNOSIS — R634 Abnormal weight loss: Secondary | ICD-10-CM | POA: Diagnosis not present

## 2021-07-04 DIAGNOSIS — R627 Adult failure to thrive: Secondary | ICD-10-CM | POA: Diagnosis not present

## 2021-07-04 DIAGNOSIS — I10 Essential (primary) hypertension: Secondary | ICD-10-CM | POA: Diagnosis not present

## 2021-07-05 DIAGNOSIS — R41841 Cognitive communication deficit: Secondary | ICD-10-CM | POA: Diagnosis not present

## 2021-07-05 DIAGNOSIS — M6281 Muscle weakness (generalized): Secondary | ICD-10-CM | POA: Diagnosis not present

## 2021-07-05 DIAGNOSIS — I5032 Chronic diastolic (congestive) heart failure: Secondary | ICD-10-CM | POA: Diagnosis not present

## 2021-07-05 DIAGNOSIS — R262 Difficulty in walking, not elsewhere classified: Secondary | ICD-10-CM | POA: Diagnosis not present

## 2021-07-06 ENCOUNTER — Ambulatory Visit (INDEPENDENT_AMBULATORY_CARE_PROVIDER_SITE_OTHER): Payer: Medicare PPO | Admitting: Cardiology

## 2021-07-06 ENCOUNTER — Other Ambulatory Visit: Payer: Self-pay

## 2021-07-06 ENCOUNTER — Encounter: Payer: Self-pay | Admitting: Cardiology

## 2021-07-06 ENCOUNTER — Other Ambulatory Visit (HOSPITAL_COMMUNITY)
Admission: RE | Admit: 2021-07-06 | Discharge: 2021-07-06 | Disposition: A | Discharge status: Home / Self Care | Payer: Medicare PPO | Source: Ambulatory Visit | Attending: Cardiology | Admitting: Cardiology

## 2021-07-06 VITALS — BP 140/70 | HR 64 | Ht 66.0 in | Wt 138.0 lb

## 2021-07-06 DIAGNOSIS — I350 Nonrheumatic aortic (valve) stenosis: Secondary | ICD-10-CM

## 2021-07-06 DIAGNOSIS — I517 Cardiomegaly: Secondary | ICD-10-CM

## 2021-07-06 DIAGNOSIS — I422 Other hypertrophic cardiomyopathy: Secondary | ICD-10-CM | POA: Insufficient documentation

## 2021-07-06 DIAGNOSIS — I5032 Chronic diastolic (congestive) heart failure: Secondary | ICD-10-CM | POA: Diagnosis not present

## 2021-07-06 DIAGNOSIS — R41841 Cognitive communication deficit: Secondary | ICD-10-CM | POA: Diagnosis not present

## 2021-07-06 DIAGNOSIS — R262 Difficulty in walking, not elsewhere classified: Secondary | ICD-10-CM | POA: Diagnosis not present

## 2021-07-06 DIAGNOSIS — M6281 Muscle weakness (generalized): Secondary | ICD-10-CM | POA: Diagnosis not present

## 2021-07-06 LAB — CBC
HCT: 33.8 % — ABNORMAL LOW (ref 36.0–46.0)
Hemoglobin: 10.8 g/dL — ABNORMAL LOW (ref 12.0–15.0)
MCH: 32.8 pg (ref 26.0–34.0)
MCHC: 32 g/dL (ref 30.0–36.0)
MCV: 102.7 fL — ABNORMAL HIGH (ref 80.0–100.0)
Platelets: 158 10*3/uL (ref 150–400)
RBC: 3.29 MIL/uL — ABNORMAL LOW (ref 3.87–5.11)
RDW: 14.4 % (ref 11.5–15.5)
WBC: 5.8 10*3/uL (ref 4.0–10.5)
nRBC: 0 % (ref 0.0–0.2)

## 2021-07-06 NOTE — Patient Instructions (Signed)
Medication Instructions:  Your physician recommends that you continue on your current medications as directed. Please refer to the Current Medication list given to you today.  *If you need a refill on your cardiac medications before your next appointment, please call your pharmacy*   Lab Work: CBC If you have labs (blood work) drawn today and your tests are completely normal, you will receive your results only by: [place] (if you have MyChart) OR A paper copy in the mail If you have any lab test that is abnormal or we need to change your treatment, we will call you to review the results.   Testing/Procedures: Caridac MRI Your physician has requested that you have an echocardiogram. Echocardiography is a painless test that uses sound waves to create images of your heart. It provides your doctor with information about the size and shape of your heart and how well your hearts chambers and valves are working. This procedure takes approximately one hour. There are no restrictions for this procedure.    Follow-Up: At [facility], you and your health needs are our priority.  As part of our continuing mission to provide you with exceptional heart care, we have created designated Provider Care Teams.  These Care Teams include your primary Cardiologist (physician) and Advanced Practice Providers (APPs -  Physician Assistants and Nurse Practitioners) who all work together to provide you with the care you need, when you need it.  We recommend signing up for the patient portal called "MyChart".  Sign up information is provided on this After Visit Summary.  MyChart is used to connect with patients for Virtual Visits (Telemedicine).  Patients are able to view lab/test results, encounter notes, upcoming appointments, etc.  Non-urgent messages can be sent to your provider as well.   To learn more about what you can do with MyChart, go to [email or web address].    Your next appointment:   3  month(s)  The format for your next appointment:   In Person  Provider:   Oswaldo Milian, MD     Other Instructions   You are scheduled for Cardiac MRI on ______________. Please arrive at the [facility] main entrance of [facility] at ________________ (30-45 minutes prior to test start time). ?  [facility] [address] [place], [place] [address] [phone]  Please take advantage of the free valet parking available at the MAIN entrance (A entrance). Proceed to the [facility] Radiology Department (First Floor). ? Magnetic resonance imaging (MRI) is a painless test that produces images of the inside of the body without using Xrays.  During an MRI, strong magnets and radio waves work together in a Research officer, political party to form detailed images.   MRI images may provide more details about a medical condition than X-rays, CT scans, and ultrasounds can provide.  You may be given earphones to listen for instructions.  You may eat a light breakfast and take medications as ordered with the exception of HCTZ (fluid pill, other). Please avoid stimulants for 12 hr prior to test. (Ie. Caffeine, nicotine, chocolate, or antihistamine medications)  If a contrast material will be used, an IV will be inserted into one of your veins. Contrast material will be injected into your IV. It will leave your body through your urine within a day. You may be told to drink plenty of fluids to help flush the contrast material out of your system.  You will be asked to remove all metal, including: Watch, jewelry, and other  metal objects including hearing aids, hair pieces and dentures. Also wearable glucose monitoring systems (ie. Freestyle Libre and Omnipods) (Braces and fillings normally are not a problem.)   TEST WILL TAKE APPROXIMATELY 1 HOUR  PLEASE NOTIFY SCHEDULING AT LEAST 24 HOURS IN ADVANCE IF YOU ARE UNABLE TO KEEP YOUR APPOINTMENT. [phone]  Please call Marchia Bond,  cardiac imaging nurse navigator with any questions/concerns. Marchia Bond RN Navigator Cardiac Imaging Gordy Clement RN Navigator Cardiac Imaging [facility] Heart and Vascular Services [phone] Office

## 2021-07-07 ENCOUNTER — Encounter: Payer: Self-pay | Admitting: *Deleted

## 2021-07-07 DIAGNOSIS — F331 Major depressive disorder, recurrent, moderate: Secondary | ICD-10-CM | POA: Diagnosis not present

## 2021-07-07 DIAGNOSIS — I5032 Chronic diastolic (congestive) heart failure: Secondary | ICD-10-CM | POA: Diagnosis not present

## 2021-07-07 DIAGNOSIS — R262 Difficulty in walking, not elsewhere classified: Secondary | ICD-10-CM | POA: Diagnosis not present

## 2021-07-07 DIAGNOSIS — M6281 Muscle weakness (generalized): Secondary | ICD-10-CM | POA: Diagnosis not present

## 2021-07-07 DIAGNOSIS — F4323 Adjustment disorder with mixed anxiety and depressed mood: Secondary | ICD-10-CM | POA: Diagnosis not present

## 2021-07-07 DIAGNOSIS — F411 Generalized anxiety disorder: Secondary | ICD-10-CM | POA: Diagnosis not present

## 2021-07-07 DIAGNOSIS — R41841 Cognitive communication deficit: Secondary | ICD-10-CM | POA: Diagnosis not present

## 2021-07-08 DIAGNOSIS — M6281 Muscle weakness (generalized): Secondary | ICD-10-CM | POA: Diagnosis not present

## 2021-07-08 DIAGNOSIS — R41841 Cognitive communication deficit: Secondary | ICD-10-CM | POA: Diagnosis not present

## 2021-07-08 DIAGNOSIS — I5032 Chronic diastolic (congestive) heart failure: Secondary | ICD-10-CM | POA: Diagnosis not present

## 2021-07-08 DIAGNOSIS — R262 Difficulty in walking, not elsewhere classified: Secondary | ICD-10-CM | POA: Diagnosis not present

## 2021-07-11 DIAGNOSIS — I5030 Unspecified diastolic (congestive) heart failure: Secondary | ICD-10-CM | POA: Diagnosis not present

## 2021-07-11 DIAGNOSIS — G8929 Other chronic pain: Secondary | ICD-10-CM | POA: Diagnosis not present

## 2021-07-11 DIAGNOSIS — R54 Age-related physical debility: Secondary | ICD-10-CM | POA: Diagnosis not present

## 2021-07-11 DIAGNOSIS — I5032 Chronic diastolic (congestive) heart failure: Secondary | ICD-10-CM | POA: Diagnosis not present

## 2021-07-11 DIAGNOSIS — F339 Major depressive disorder, recurrent, unspecified: Secondary | ICD-10-CM | POA: Diagnosis not present

## 2021-07-11 DIAGNOSIS — K219 Gastro-esophageal reflux disease without esophagitis: Secondary | ICD-10-CM | POA: Diagnosis not present

## 2021-07-11 DIAGNOSIS — I10 Essential (primary) hypertension: Secondary | ICD-10-CM | POA: Diagnosis not present

## 2021-07-11 DIAGNOSIS — R627 Adult failure to thrive: Secondary | ICD-10-CM | POA: Diagnosis not present

## 2021-07-19 DIAGNOSIS — M79674 Pain in right toe(s): Secondary | ICD-10-CM | POA: Diagnosis not present

## 2021-07-19 DIAGNOSIS — B351 Tinea unguium: Secondary | ICD-10-CM | POA: Diagnosis not present

## 2021-07-19 DIAGNOSIS — M79675 Pain in left toe(s): Secondary | ICD-10-CM | POA: Diagnosis not present

## 2021-08-01 DIAGNOSIS — K760 Fatty (change of) liver, not elsewhere classified: Secondary | ICD-10-CM | POA: Diagnosis not present

## 2021-08-01 DIAGNOSIS — F329 Major depressive disorder, single episode, unspecified: Secondary | ICD-10-CM | POA: Diagnosis not present

## 2021-08-01 DIAGNOSIS — G8929 Other chronic pain: Secondary | ICD-10-CM | POA: Diagnosis not present

## 2021-08-01 DIAGNOSIS — I5032 Chronic diastolic (congestive) heart failure: Secondary | ICD-10-CM | POA: Diagnosis not present

## 2021-08-01 DIAGNOSIS — I110 Hypertensive heart disease with heart failure: Secondary | ICD-10-CM | POA: Diagnosis not present

## 2021-08-01 DIAGNOSIS — F419 Anxiety disorder, unspecified: Secondary | ICD-10-CM | POA: Diagnosis not present

## 2021-08-01 DIAGNOSIS — E785 Hyperlipidemia, unspecified: Secondary | ICD-10-CM | POA: Diagnosis not present

## 2021-08-01 DIAGNOSIS — K219 Gastro-esophageal reflux disease without esophagitis: Secondary | ICD-10-CM | POA: Diagnosis not present

## 2021-08-01 DIAGNOSIS — M810 Age-related osteoporosis without current pathological fracture: Secondary | ICD-10-CM | POA: Diagnosis not present

## 2021-08-02 ENCOUNTER — Telehealth (HOSPITAL_COMMUNITY): Payer: Self-pay | Admitting: *Deleted

## 2021-08-02 NOTE — Telephone Encounter (Signed)
Reaching out the patient regarding her cardiac MRI but was told my husband that the patient resides at [facility]. I called the assisted living facility to find out the patient has COVID and will need rescheduling. Informed the Care Coordinator that the scheduler will reach out to reschedule. She verbalized understanding.   Gordy Clement RN Navigator Cardiac Imaging [facility] Heart and Vascular Services [phone] Office [phone] Cell

## 2021-08-03 ENCOUNTER — Ambulatory Visit (HOSPITAL_COMMUNITY): Admission: RE | Admit: 2021-08-03 | Payer: Medicare PPO | Source: Ambulatory Visit

## 2021-08-05 ENCOUNTER — Ambulatory Visit (HOSPITAL_COMMUNITY): Admission: RE | Admit: 2021-08-05 | Payer: Medicare PPO | Source: Ambulatory Visit

## 2021-09-06 ENCOUNTER — Telehealth (HOSPITAL_COMMUNITY): Payer: Self-pay | Admitting: *Deleted

## 2021-09-06 NOTE — Telephone Encounter (Signed)
Reaching out the nursing facility where patient resides to make sure they were aware of patient's appointment and the ensure that the patient was prepared.  They verbalized understanding of instructions. ? ?Gordy Clement RN Navigator Cardiac Imaging ?[place] [facility] ?[phone] Office ?[phone] Cell ? ?

## 2021-09-07 ENCOUNTER — Ambulatory Visit (HOSPITAL_COMMUNITY)
Admission: RE | Admit: 2021-09-07 | Discharge: 2021-09-07 | Disposition: A | Discharge status: Home / Self Care | Payer: Medicare PPO | Source: Ambulatory Visit | Attending: Cardiology | Admitting: Cardiology

## 2021-09-07 ENCOUNTER — Other Ambulatory Visit: Payer: Self-pay

## 2021-09-07 DIAGNOSIS — I517 Cardiomegaly: Secondary | ICD-10-CM | POA: Diagnosis not present

## 2021-09-07 MED ORDER — GADOBUTROL 1 MMOL/ML IV SOLN
8.0000 mL | Freq: Once | INTRAVENOUS | Status: AC | PRN
  Administered 2021-09-07: 8 mL via INTRAVENOUS

## 2021-09-30 ENCOUNTER — Ambulatory Visit (HOSPITAL_COMMUNITY)
Admission: RE | Admit: 2021-09-30 | Discharge: 2021-09-30 | Disposition: A | Discharge status: Home / Self Care | Payer: Medicare PPO | Source: Ambulatory Visit | Attending: Cardiology | Admitting: Cardiology

## 2021-09-30 DIAGNOSIS — I517 Cardiomegaly: Secondary | ICD-10-CM | POA: Diagnosis not present

## 2021-09-30 LAB — ECHOCARDIOGRAM COMPLETE
AR max vel: 1.11 cm2
AV Area VTI: 1.24 cm2
AV Area mean vel: 1.34 cm2
AV Mean grad: 15 mmHg
AV Peak grad: 25.8 mmHg
Ao pk vel: 2.54 m/s
Area-P 1/2: 1.66 cm2
MV VTI: 1.38 cm2
S' Lateral: 1.9 cm

## 2021-09-30 NOTE — Progress Notes (Signed)
*  PRELIMINARY RESULTS* ?Echocardiogram ?2D Echocardiogram has been performed. ? ?Tammy Terry ?09/30/2021, 3:17 PM ?

## 2021-10-04 DIAGNOSIS — M79674 Pain in right toe(s): Secondary | ICD-10-CM | POA: Diagnosis not present

## 2021-10-04 DIAGNOSIS — M79675 Pain in left toe(s): Secondary | ICD-10-CM | POA: Diagnosis not present

## 2021-10-04 DIAGNOSIS — B351 Tinea unguium: Secondary | ICD-10-CM | POA: Diagnosis not present

## 2021-10-05 ENCOUNTER — Encounter: Payer: Self-pay | Admitting: Student

## 2021-10-05 ENCOUNTER — Ambulatory Visit (INDEPENDENT_AMBULATORY_CARE_PROVIDER_SITE_OTHER): Payer: Medicare PPO | Admitting: Student

## 2021-10-05 VITALS — BP 128/68 | HR 72 | Ht 65.0 in | Wt 127.4 lb

## 2021-10-05 DIAGNOSIS — I517 Cardiomegaly: Secondary | ICD-10-CM

## 2021-10-05 DIAGNOSIS — I350 Nonrheumatic aortic (valve) stenosis: Secondary | ICD-10-CM

## 2021-10-05 DIAGNOSIS — Z86718 Personal history of other venous thrombosis and embolism: Secondary | ICD-10-CM

## 2021-10-05 NOTE — Patient Instructions (Signed)
Medication Instructions:  ?Your physician recommends that you continue on your current medications as directed. Please refer to the Current Medication list given to you today. ? ?*If you need a refill on your cardiac medications before your next appointment, please call your pharmacy* ? ? ?Lab Work: ?NONE  ? ?If you have labs (blood work) drawn today and your tests are completely normal, you will receive your results only by: ?MyChart Message (if you have MyChart) OR ?A paper copy in the mail ?If you have any lab test that is abnormal or we need to change your treatment, we will call you to review the results. ? ? ?Testing/Procedures: ?NONE  ? ? ?Follow-Up: ?At [facility], you and your health needs are our priority.  As part of our continuing mission to provide you with exceptional heart care, we have created designated Provider Care Teams.  These Care Teams include your primary Cardiologist (physician) and Advanced Practice Providers (APPs -  Physician Assistants and Nurse Practitioners) who all work together to provide you with the care you need, when you need it. ? ?We recommend signing up for the patient portal called "MyChart".  Sign up information is provided on this After Visit Summary.  MyChart is used to connect with patients for Virtual Visits (Telemedicine).  Patients are able to view lab/test results, encounter notes, upcoming appointments, etc.  Non-urgent messages can be sent to your provider as well.   ?To learn more about what you can do with MyChart, go to [email or web address].   ? ?Your next appointment:   ?6 month(s) ? ?The format for your next appointment:   ?In Person ? ?Provider:   ?You may see Dr. Alda Lea or one of the following Advanced Practice Providers on your designated Care Team:   ?Bernerd Pho, PA-C  ?Ermalinda Barrios, PA-C   ? ? ?Other Instructions ?Thank you for choosing [place]! ?  ? ?Important Information About [place] ? ? ? ? ?  ?

## 2021-10-05 NOTE — Progress Notes (Addendum)
? ?Virtual Visit via Telephone Note  ? ?This visit type was conducted due to national recommendations for restrictions regarding the COVID-19 Pandemic (e.g. social distancing) in an effort to limit this patient's exposure and mitigate transmission in our community.  Due to his co-morbid illnesses, this patient is at least at moderate risk for complications without adequate follow up.  This format is felt to be most appropriate for this patient at this time.  The patient did not have access to video technology/had technical difficulties with video requiring transitioning to audio format only (telephone).  All issues noted in this document were discussed and addressed.  No physical exam could be performed with this format.  Please refer to the patient's chart for his  consent to telehealth for [facility].  ? ? ?Date:  10/05/2021  ? ?ID:  Tammy Terry, DOB February 19, 1941, MRN [id number] ?The patient was identified using 2 identifiers. ? ?Patient Location: Other:  [place] ?Provider Location: Home Office ? ? ?PCP:  Celene Squibb, MD ?  ?[facility] ?Cardiologist:  Donato Heinz, MD    ? ?Evaluation Performed:  Follow-Up Visit ? ?Chief Complaint:  No complaints ? ?History of Present Illness:   ? ?Tammy Terry is a 81 y.o. adult with past medical history of HFpEF, HTN, HLD, history of DVT (on Eliquis) and history of breast cancer (s/p chemotherapy and radiation) who presents for a 83-month follow-up telehealth visit.  ? ?She was last examined by Dr. Gardiner Rhyme in 06/2021 for hospital follow-up from an admission in 04/2021 for generalized weakness in the setting of a UTI.  Echocardiogram during that admission had shown severe hypertrophy of the basal septum with S.A.M. and LVOT peak gradient 41 mmHg.  It was felt that her LVOT obstruction was worsened by dehydration and she was started on beta-blocker therapy. At the time of follow-up, she reported overall doing well and denied any recent  symptoms.  he was continued on Toprol-XL 50 mg daily at the time of her visit and a cardiac MRI and repeat echocardiogram were recommended for further evaluation. ? ?Her cardiac MRI on 09/07/2021 showed asymmetric LV hypertrophy which met criteria for hypertrophic cardiomyopathy with asymmetric LVH could also be seen with aortic stenosis and appeared moderate. There was no evidence of amyloidosis. Her echocardiogram showed an EF greater than 75% with moderate LVH and grade 1 diastolic dysfunction. Her aortic stenosis was in a mild range with mean gradient having decreased when compared to prior imaging from 2022. ? ?In talking with the patient today, she reports her breathing has been stable and denies any dyspnea on exertion, orthopnea or PND. She is mostly wheelchair-bound at baseline. No reported chest pain or palpitations. She is unsure of her medication regimen but this was reviewed with her nurse and she has been taking Toprol-XL along with Eliquis for anticoagulation. ? ?The patient does not have symptoms concerning for COVID-19 infection (fever, chills, cough, or new shortness of breath).  ? ? ?Past Medical History:  ?Diagnosis Date  ? Acquired absence of unspecified breast and nipple   ? Age-related osteoporosis without current pathological fracture   ? Age-related osteoporosis without current pathological fracture   ? Anxiety   ? Anxiety disorder, unspecified   ? Cancer [facility]) 1994  ? BREAST CANCER, TREATED WITH RADIATION AND CHEMOTHERAPY  ? Depression with anxiety 03/01/2012  ? Essential (primary) hypertension   ? Fatty (change of) liver, not elsewhere classified   ? Fatty liver   ? H/O  vitamin D deficiency   ? Hyperlipidemia 03/01/2012  ? Hypertension   ? Insomnia, unspecified   ? Localized edema   ? Lumbago   ? Major depressive disorder, single episode, unspecified   ? Malignant neoplasm of unspecified site of unspecified female breast ([place])   ? Osteoporosis   ? Other abnormalities of gait and mobility   ?  Polyosteoarthritis, unspecified   ? Radiculopathy, site unspecified   ? Rosacea   ? Syncope and collapse   ? Tremor, unspecified   ? Unspecified convulsions ([place])   ? Unspecified fall, initial encounter   ? ?Past Surgical History:  ?Procedure Laterality Date  ? BREAST SURGERY  1994  ? LEFT MASTECTOMY   ? CAPSULOTOMY  august 2015  ? of left eye  ? CATARACT EXTRACTION Left   ? CESAREAN SECTION    ? COLONOSCOPY N/A 05/13/2014  ? Procedure: COLONOSCOPY;  Surgeon: Rogene Houston, MD;  [facility];  Service: Endoscopy;  Laterality: N/A;  930  ? MASTECTOMY    ? left breast for cancer in 1994  ? TONSILLECTOMY    ?  ? ?Current Meds  ?Medication Sig  ? acetaminophen (TYLENOL) 325 MG tablet Take 650 mg by mouth every 6 (six) hours as needed for headache.  ? apixaban (ELIQUIS) 5 MG TABS tablet Take 1 tablet (5 mg total) by mouth 2 (two) times daily.  ? ascorbic acid (VITAMIN C) 500 MG tablet Take 1 tablet (500 mg total) by mouth daily.  ? metoprolol succinate (TOPROL-XL) 50 MG 24 hr tablet Take 1 tablet (50 mg total) by mouth daily. Take with or immediately following a meal.  ? pantoprazole (PROTONIX) 40 MG tablet Take 1 tablet (40 mg total) by mouth daily.  ? zinc sulfate 220 (50 Zn) MG capsule Take 1 capsule (220 mg total) by mouth daily.  ?  ? ?Allergies:   Amoxicillin, Codeine, and Penicillins  ? ?Social History  ? ?Tobacco Use  ? Smoking status: Never  ? Smokeless tobacco: Never  ?Vaping Use  ? Vaping Use: Never used  ?Substance Use Topics  ? Alcohol use: No  ?  Alcohol/week: 0.0 standard drinks  ? Drug use: No  ?  ? ?Family Hx: ?The patient's family history includes Diabetes in his paternal grandmother; Heart attack in his father. ? ?ROS:   ?Please see the history of present illness.    ? ?All other systems reviewed and are negative. ? ? ?Prior CV studies:   ?The following studies were reviewed today: ? ?Cardiac MRI: 08/2021 ?IMPRESSION: ?1. Asymmetric LV hypertrophy measuring 51mm in basal septum (44mm  in ?posterior wall). No evidence of amyloidosis. While meets criteria ?for hypertrophic cardiomyopathy, asymmetric LVH could also be seen ?in setting of aortic stenosis. AS appears moderate visually but was ?not quantified ?  ?2.  No late gadolinium enhancement to suggest myocardial scar ?  ?3.  Normal LV size and systolic function (EF 93%) ?  ?4.  Normal RV size and systolic function (EF 73%) ? ?Echo: 09/2021 ?IMPRESSIONS  ? ? ? 1. Left ventricular ejection fraction, by estimation, is >75%. The left  ?ventricle has hyperdynamic function. The left ventricle has no regional  ?wall motion abnormalities. There is moderate left ventricular hypertrophy.  ?Left ventricular diastolic  ?parameters are consistent with Grade I diastolic dysfunction (impaired  ?relaxation). Elevated left atrial pressure.  ? 2. Right ventricular systolic function is normal. The right ventricular  ?size is normal. There is normal pulmonary artery systolic pressure.  ?  3. Left atrial size was moderately dilated.  ? 4. Trivial mitral valve regurgitation.  ? 5. AV is thickened, calcified. Difficult to see leaflets well Peak and  ?mean gradients through the valve are 26 and 15 mm Hg respectively AVA  ?(VTI) is 1.24 cm2. Dimensionless index is 0.7 consistent with mild AS.  ?Compared to previous echo from 2022, mean  ? gradient is decreased. . Aortic valve regurgitation is not visualized.  ? 6. The inferior vena cava is normal in size with greater than 50%  ?respiratory variability, suggesting right atrial pressure of 3 mmHg.  ? ?Labs/Other Tests and Data Reviewed:   ? ?EKG:  No ECG reviewed. ? ?Recent Labs: ?05/21/2021: ALT 12; Magnesium 1.8 ?05/23/2021: BUN 19; Creatinine, Ser 0.56; Potassium 4.1; Sodium 139 ?07/06/2021: Hemoglobin 10.8; Platelets 158  ? ?Recent Lipid Panel ?Lab Results  ?Component Value Date/Time  ? CHOL 211 (A) 12/12/2018 12:00 AM  ? TRIG 117 12/12/2018 12:00 AM  ? HDL 72 (A) 12/12/2018 12:00 AM  ? LDLCALC 116 12/12/2018 12:00 AM   ? ? ?Wt Readings from Last 3 Encounters:  ?10/05/21 127 lb 6.4 oz (57.8 kg)  ?07/06/21 138 lb (62.6 kg)  ?05/20/21 154 lb 5.2 oz (70 kg)  ?  ? ?    ?Objective:   ? ?Vital Signs:  BP 128/68   Pulse 72   Ht 5' 5

## 2021-11-07 ENCOUNTER — Ambulatory Visit: Payer: Medicare PPO | Admitting: Dermatology

## 2022-03-14 DIAGNOSIS — M79675 Pain in left toe(s): Secondary | ICD-10-CM | POA: Diagnosis not present

## 2022-03-14 DIAGNOSIS — B351 Tinea unguium: Secondary | ICD-10-CM | POA: Diagnosis not present

## 2022-03-14 DIAGNOSIS — M79674 Pain in right toe(s): Secondary | ICD-10-CM | POA: Diagnosis not present

## 2022-03-28 DIAGNOSIS — I82409 Acute embolism and thrombosis of unspecified deep veins of unspecified lower extremity: Secondary | ICD-10-CM | POA: Diagnosis not present

## 2022-04-06 DIAGNOSIS — I350 Nonrheumatic aortic (valve) stenosis: Secondary | ICD-10-CM | POA: Diagnosis not present

## 2022-04-06 DIAGNOSIS — Z0001 Encounter for general adult medical examination with abnormal findings: Secondary | ICD-10-CM | POA: Diagnosis not present

## 2022-04-06 DIAGNOSIS — M7989 Other specified soft tissue disorders: Secondary | ICD-10-CM | POA: Diagnosis not present

## 2022-04-06 DIAGNOSIS — R413 Other amnesia: Secondary | ICD-10-CM | POA: Diagnosis not present

## 2022-04-06 DIAGNOSIS — Z86718 Personal history of other venous thrombosis and embolism: Secondary | ICD-10-CM | POA: Diagnosis not present

## 2022-04-06 DIAGNOSIS — I422 Other hypertrophic cardiomyopathy: Secondary | ICD-10-CM | POA: Diagnosis not present

## 2022-04-06 DIAGNOSIS — I10 Essential (primary) hypertension: Secondary | ICD-10-CM | POA: Diagnosis not present

## 2022-04-06 DIAGNOSIS — D696 Thrombocytopenia, unspecified: Secondary | ICD-10-CM | POA: Diagnosis not present

## 2022-04-06 DIAGNOSIS — K219 Gastro-esophageal reflux disease without esophagitis: Secondary | ICD-10-CM | POA: Diagnosis not present

## 2022-04-24 NOTE — Progress Notes (Signed)
Cardiology Office Note:    Date:  04/25/2022   ID:  Tammy Terry, DOB January 17, 1941, MRN [id number]  PCP:  Benita Stabile, MD  Cardiologist:  Little Ishikawa, MD  Electrophysiologist:  None   Referring MD: Benita Stabile, MD   Chief Complaint  Patient presents with   Aortic Stenosis    History of Present Illness:    Tammy Terry is a 81 y.o. adult with a hx of hypertension, breast cancer, hyperlipidemia, chronic diastolic heart failure who presents for hospital follow-up.  She was admitted to [facility] from 11/25 through 05/23/2021 after presenting with weakness and encephalopathy, thought to be secondary to UTI.  Echocardiogram on 05/21/2021 showed severe hypertrophy of basal septum with SAM and LVOT peak gradient 41 mmHg, increased to 83 mmHg with Valsalva, mild AS.  She was seen by cardiology, felt that LVOT obstruction was worsened by dehydration.  She was started on beta-blocker and encouraged to stay hydrated, with plan for repeat echocardiogram as outpatient.  Cardiac MRI 09/07/2021 showed asymmetric LV hypertrophy measuring 15 mm in basal septum (5 mm in posterior wall); no evidence of amyloidosis, while meets criteria for HCM, asymmetric LVH can also be seen in setting of aortic stenosis.  No LGE, normal biventricular size and systolic function.  Echocardiogram 09/30/2021 showed EF greater than 75%, moderate LVH, grade 1 diastolic dysfunction, normal RV function, moderate left atrial enlargement, mild aortic stenosis.  Since last clinic visit, she reports that she is doing well.  Denies any chest pain, dyspnea, lightheadedness, syncope, lower extremity edema, or palpitations.  Minimal activity, currently in wheelchair.  No bleeding issues, currently on Eliquis.  Past Medical History:  Diagnosis Date   Acquired absence of unspecified breast and nipple    Age-related osteoporosis without current pathological fracture    Age-related osteoporosis without current pathological  fracture    Anxiety    Anxiety disorder, unspecified    Cancer ([facility]) 1994   BREAST CANCER, TREATED WITH RADIATION AND CHEMOTHERAPY   Depression with anxiety 03/01/2012   Essential (primary) hypertension    Fatty (change of) liver, not elsewhere classified    Fatty liver    H/O vitamin D deficiency    Hyperlipidemia 03/01/2012   Hypertension    Insomnia, unspecified    Localized edema    Lumbago    Major depressive disorder, single episode, unspecified    Malignant neoplasm of unspecified site of unspecified female breast ([facility])    Osteoporosis    Other abnormalities of gait and mobility    Polyosteoarthritis, unspecified    Radiculopathy, site unspecified    Rosacea    Syncope and collapse    Tremor, unspecified    Unspecified convulsions ([facility])    Unspecified fall, initial encounter     Past Surgical History:  Procedure Laterality Date   BREAST SURGERY  1994   LEFT MASTECTOMY    CAPSULOTOMY  august 2015   of left eye   CATARACT EXTRACTION Left    CESAREAN SECTION     COLONOSCOPY N/A 05/13/2014   Procedure: COLONOSCOPY;  Surgeon: Malissa Hippo, MD;  [facility];  Service: Endoscopy;  Laterality: N/A;  930   MASTECTOMY     left breast for cancer in 1994   TONSILLECTOMY      Current Medications: Current Meds  Medication Sig   acetaminophen (TYLENOL) 325 MG tablet Take 650 mg by mouth every 6 (six) hours as needed for headache.   apixaban (ELIQUIS) 5 MG  TABS tablet Take 1 tablet (5 mg total) by mouth 2 (two) times daily.   ascorbic acid (VITAMIN C) 500 MG tablet Take 1 tablet (500 mg total) by mouth daily.   metoprolol succinate (TOPROL-XL) 50 MG 24 hr tablet Take 1 tablet (50 mg total) by mouth daily. Take with or immediately following a meal.   Nystatin (GERHARDT'S BUTT CREAM) CREA Apply to perineum/groin and affected areas twice daily   pantoprazole (PROTONIX) 40 MG tablet Take 1 tablet (40 mg total) by mouth daily.   zinc sulfate 220 (50 Zn) MG capsule  Take 1 capsule (220 mg total) by mouth daily.     Allergies:   Amoxicillin, Codeine, and Penicillins   Social History   Socioeconomic History   Marital status: Married    Spouse name: Not on file   Number of children: 1   Years of education: college   Highest education level: Not on file  Occupational History   Occupation: Retired  Tobacco Use   Smoking status: Never   Smokeless tobacco: Never  Vaping Use   Vaping Use: Never used  Substance and Sexual Activity   Alcohol use: No    Alcohol/week: 0.0 standard drinks of alcohol   Drug use: No   Sexual activity: Never  Other Topics Concern   Not on file  Social History Narrative   Denies caffeine use    Social Determinants of Health   Financial Resource Strain: Not on file  Food Insecurity: Not on file  Transportation Needs: Not on file  Physical Activity: Not on file  Stress: Not on file  Social Connections: Not on file     Family History: The patient's family history includes Diabetes in his paternal grandmother; Heart attack in his father.  ROS:   Please see the history of present illness.     All other systems reviewed and are negative.  EKGs/Labs/Other Studies Reviewed:    The following studies were reviewed today:   EKG:   04/25/22: NSR, rate 62, LVH  Recent Labs: 05/21/2021: ALT 12; Magnesium 1.8 05/23/2021: BUN 19; Creatinine, Ser 0.56; Potassium 4.1; Sodium 139 07/06/2021: Hemoglobin 10.8; Platelets 158  Recent Lipid Panel    Component Value Date/Time   CHOL 211 (A) 12/12/2018 0000   TRIG 117 12/12/2018 0000   HDL 72 (A) 12/12/2018 0000   LDLCALC 116 12/12/2018 0000    Physical Exam:    VS:  BP 130/66   Pulse 62   Ht 5\' 5"  (1.651 m)   Wt 169 lb (76.7 kg)   SpO2 98%   BMI 28.12 kg/m     Wt Readings from Last 3 Encounters:  04/25/22 169 lb (76.7 kg)  10/05/21 127 lb 6.4 oz (57.8 kg)  07/06/21 138 lb (62.6 kg)     GEN:  in no acute distress HEENT: Normal NECK: No JVD; No carotid  bruits CARDIAC: RRR, 2/6 systolic murmur RESPIRATORY:  Clear to auscultation without rales, wheezing or rhonchi  ABDOMEN: Soft, non-tender, non-distended MUSCULOSKELETAL:  No edema; No deformity  SKIN: Warm and dry NEUROLOGIC:  Alert and oriented x 3 PSYCHIATRIC:  Normal affect   ASSESSMENT:    1. Aortic valve stenosis, etiology of cardiac valve disease unspecified   2. LVH (left ventricular hypertrophy)   3. History of DVT (deep vein thrombosis)     PLAN:    LVH: Echocardiogram on 05/21/2021 showed severe hypertrophy of basal septum with SAM and LVOT peak gradient 41 mmHg, increased to 83 mmHg with Valsalva, mild  AS.  She was seen by cardiology, felt that LVOT obstruction was worsened by dehydration.  She was started on beta-blocker and encouraged to stay hydrated, with plan for repeat echocardiogram as outpatient.  Cardiac MRI 09/07/2021 showed asymmetric LV hypertrophy measuring 15 mm in basal septum (5 mm in posterior wall); no evidence of amyloidosis, while meets criteria for HCM, asymmetric LVH can also be seen in setting of aortic stenosis.  No LGE, normal biventricular size and systolic function.  Echocardiogram 09/30/2021 showed EF greater than 75%, moderate LVH, grade 1 diastolic dysfunction, normal RV function, moderate left atrial enlargement, mild aortic stenosis. -Continue Toprol-XL 50 mg daily -Encouraged to stay hydrated  DVT: On apixaban  AS: mild on echo 09/2021, will monitor  RTC in 1 year   Medication Adjustments/Labs and Tests Ordered: Current medicines are reviewed at length with the patient today.  Concerns regarding medicines are outlined above.  Orders Placed This Encounter  Procedures   EKG 12-Lead   No orders of the defined types were placed in this encounter.   Patient Instructions  Medication Instructions:  Your physician recommends that you continue on your current medications as directed. Please refer to the Current Medication list given to you  today.   Labwork: None today  Testing/Procedures: None today  Follow-Up: 1 year Dr.Mannipeddi  Any Other Special Instructions Will Be Listed Below (If Applicable).  If you need a refill on your cardiac medications before your next appointment, please call your pharmacy.    Signed, Little Ishikawa, MD  04/25/2022 9:17 AM    [facility]

## 2022-04-25 ENCOUNTER — Encounter: Payer: Self-pay | Admitting: Cardiology

## 2022-04-25 ENCOUNTER — Ambulatory Visit: Payer: Medicare PPO | Attending: Cardiology | Admitting: Cardiology

## 2022-04-25 VITALS — BP 130/66 | HR 62 | Ht 65.0 in | Wt 169.0 lb

## 2022-04-25 DIAGNOSIS — I350 Nonrheumatic aortic (valve) stenosis: Secondary | ICD-10-CM

## 2022-04-25 DIAGNOSIS — I517 Cardiomegaly: Secondary | ICD-10-CM

## 2022-04-25 DIAGNOSIS — Z86718 Personal history of other venous thrombosis and embolism: Secondary | ICD-10-CM

## 2022-04-25 NOTE — Patient Instructions (Signed)
Medication Instructions:  Your physician recommends that you continue on your current medications as directed. Please refer to the Current Medication list given to you today.   Labwork: None today  Testing/Procedures: None today  Follow-Up: 1 year Dr.Mannipeddi  Any Other Special Instructions Will Be Listed Below (If Applicable).  If you need a refill on your cardiac medications before your next appointment, please call your pharmacy.

## 2022-07-12 DIAGNOSIS — I5032 Chronic diastolic (congestive) heart failure: Secondary | ICD-10-CM | POA: Diagnosis not present

## 2022-07-12 DIAGNOSIS — M6281 Muscle weakness (generalized): Secondary | ICD-10-CM | POA: Diagnosis not present

## 2022-08-15 DIAGNOSIS — M79675 Pain in left toe(s): Secondary | ICD-10-CM | POA: Diagnosis not present

## 2022-08-15 DIAGNOSIS — B351 Tinea unguium: Secondary | ICD-10-CM | POA: Diagnosis not present

## 2022-08-15 DIAGNOSIS — M79674 Pain in right toe(s): Secondary | ICD-10-CM | POA: Diagnosis not present

## 2022-10-05 DIAGNOSIS — I422 Other hypertrophic cardiomyopathy: Secondary | ICD-10-CM | POA: Diagnosis not present

## 2022-10-12 DIAGNOSIS — K219 Gastro-esophageal reflux disease without esophagitis: Secondary | ICD-10-CM | POA: Diagnosis not present

## 2022-10-12 DIAGNOSIS — I350 Nonrheumatic aortic (valve) stenosis: Secondary | ICD-10-CM | POA: Diagnosis not present

## 2022-10-12 DIAGNOSIS — I422 Other hypertrophic cardiomyopathy: Secondary | ICD-10-CM | POA: Diagnosis not present

## 2022-10-12 DIAGNOSIS — M7989 Other specified soft tissue disorders: Secondary | ICD-10-CM | POA: Diagnosis not present

## 2022-10-12 DIAGNOSIS — E785 Hyperlipidemia, unspecified: Secondary | ICD-10-CM | POA: Diagnosis not present

## 2022-10-12 DIAGNOSIS — Z86718 Personal history of other venous thrombosis and embolism: Secondary | ICD-10-CM | POA: Diagnosis not present

## 2022-10-12 DIAGNOSIS — D696 Thrombocytopenia, unspecified: Secondary | ICD-10-CM | POA: Diagnosis not present

## 2022-10-12 DIAGNOSIS — I10 Essential (primary) hypertension: Secondary | ICD-10-CM | POA: Diagnosis not present

## 2022-10-12 DIAGNOSIS — R413 Other amnesia: Secondary | ICD-10-CM | POA: Diagnosis not present

## 2022-10-31 DIAGNOSIS — M79674 Pain in right toe(s): Secondary | ICD-10-CM | POA: Diagnosis not present

## 2022-10-31 DIAGNOSIS — B351 Tinea unguium: Secondary | ICD-10-CM | POA: Diagnosis not present

## 2022-10-31 DIAGNOSIS — M79675 Pain in left toe(s): Secondary | ICD-10-CM | POA: Diagnosis not present

## 2022-12-26 ENCOUNTER — Other Ambulatory Visit (HOSPITAL_COMMUNITY): Payer: Self-pay | Admitting: Family Medicine

## 2022-12-26 DIAGNOSIS — R059 Cough, unspecified: Secondary | ICD-10-CM

## 2022-12-28 DIAGNOSIS — R059 Cough, unspecified: Secondary | ICD-10-CM | POA: Diagnosis not present

## 2023-01-16 DIAGNOSIS — M79674 Pain in right toe(s): Secondary | ICD-10-CM | POA: Diagnosis not present

## 2023-01-16 DIAGNOSIS — M79675 Pain in left toe(s): Secondary | ICD-10-CM | POA: Diagnosis not present

## 2023-01-16 DIAGNOSIS — B351 Tinea unguium: Secondary | ICD-10-CM | POA: Diagnosis not present

## 2023-04-03 DIAGNOSIS — M79674 Pain in right toe(s): Secondary | ICD-10-CM | POA: Diagnosis not present

## 2023-04-03 DIAGNOSIS — B351 Tinea unguium: Secondary | ICD-10-CM | POA: Diagnosis not present

## 2023-04-03 DIAGNOSIS — M79675 Pain in left toe(s): Secondary | ICD-10-CM | POA: Diagnosis not present

## 2023-04-12 DIAGNOSIS — I422 Other hypertrophic cardiomyopathy: Secondary | ICD-10-CM | POA: Diagnosis not present

## 2023-04-13 ENCOUNTER — Encounter: Payer: Self-pay | Admitting: Internal Medicine

## 2023-04-13 ENCOUNTER — Ambulatory Visit: Payer: Medicare PPO | Attending: Internal Medicine | Admitting: Internal Medicine

## 2023-04-13 VITALS — BP 158/70 | HR 61 | Ht 66.0 in | Wt 179.0 lb

## 2023-04-13 DIAGNOSIS — I517 Cardiomegaly: Secondary | ICD-10-CM | POA: Diagnosis not present

## 2023-04-13 DIAGNOSIS — I5032 Chronic diastolic (congestive) heart failure: Secondary | ICD-10-CM | POA: Diagnosis not present

## 2023-04-13 DIAGNOSIS — I350 Nonrheumatic aortic (valve) stenosis: Secondary | ICD-10-CM

## 2023-04-13 NOTE — Progress Notes (Signed)
Cardiology Office Note  Date: 04/13/2023   ID: Tammy Terry, DOB Aug 31, 1940, MRN [id number]  PCP:  Benita Stabile, MD  Cardiologist:  Little Ishikawa, MD Electrophysiologist:  None   History of Present Illness: Tammy Terry is a 82 y.o. adult known to have HTN, breast cancer, HLD, chronic diastolic heart failure is here for follow-up visit.  Patient was admitted to [facility] in 2022 with generalized weakness and encephalopathy, thought to be secondary to UTI. Echocardiogram in 2022 showed severe hypertrophy of the basal septum with SAM and LVOT peak gradient 41 mmHg increased to 83 mmHg with Valsalva, mild aortic valve stenosis. She was seen by cardiology, felt that LVOT obstruction was worsened by dehydration.  She was started on beta-blocker and encouraged to stay hydrated, with plan for repeat echocardiogram as outpatient.  Cardiac MRI in 2023 showed asymmetric LVH measuring 15 mm and basal septum and 5 mm in posterior wall, no evidence of amyloidosis although it did meet criteria for HCM, asymmetric LVH can also be seen in the setting of aortic stenosis. No LGE, normal biventricular size and systolic function.  Echocardiogram in 09/2021 showed LVEF more than 70%, moderate LVH, G1 DD, normal RV function, moderate LA enlargement, mild AS.  Lives at [facility] facility.  In wheelchair.  She can walk but prefers not to walk.  No symptoms, overall doing great.  No angina, DOE, orthopnea, PND, dizziness, presyncope, syncope and leg swelling.  Compliant with medications and has no side effects.  Past Medical History:  Diagnosis Date   Acquired absence of unspecified breast and nipple    Age-related osteoporosis without current pathological fracture    Age-related osteoporosis without current pathological fracture    Anxiety    Anxiety disorder, unspecified    Cancer (HCC) 1994   BREAST CANCER, TREATED WITH RADIATION AND CHEMOTHERAPY   Depression with anxiety 03/01/2012    Essential (primary) hypertension    Fatty (change of) liver, not elsewhere classified    Fatty liver    H/O vitamin D deficiency    Hyperlipidemia 03/01/2012   Hypertension    Insomnia, unspecified    Localized edema    Lumbago    Major depressive disorder, single episode, unspecified    Malignant neoplasm of unspecified site of unspecified female breast (HCC)    Osteoporosis    Other abnormalities of gait and mobility    Polyosteoarthritis, unspecified    Radiculopathy, site unspecified    Rosacea    Syncope and collapse    Tremor, unspecified    Unspecified convulsions (HCC)    Unspecified fall, initial encounter     Past Surgical History:  Procedure Laterality Date   BREAST SURGERY  1994   LEFT MASTECTOMY    CAPSULOTOMY  august 2015   of left eye   CATARACT EXTRACTION Left    CESAREAN SECTION     COLONOSCOPY N/A 05/13/2014   Procedure: COLONOSCOPY;  Surgeon: Malissa Hippo, MD;  Location: [facility];  Service: Endoscopy;  Laterality: N/A;  930   MASTECTOMY     left breast for cancer in 1994   TONSILLECTOMY      Current Outpatient Medications  Medication Sig Dispense Refill   acetaminophen (TYLENOL) 325 MG tablet Take 650 mg by mouth every 6 (six) hours as needed for headache.     apixaban (ELIQUIS) 5 MG TABS tablet Take 1 tablet (5 mg total) by mouth 2 (two) times daily. 60 tablet 5   ascorbic acid (  VITAMIN C) 500 MG tablet Take 1 tablet (500 mg total) by mouth daily. 30 tablet 1   metoprolol succinate (TOPROL-XL) 50 MG 24 hr tablet Take 1 tablet (50 mg total) by mouth daily. Take with or immediately following a meal.     Nystatin (GERHARDT'S BUTT CREAM) CREA Apply to perineum/groin and affected areas twice daily     pantoprazole (PROTONIX) 40 MG tablet Take 1 tablet (40 mg total) by mouth daily. 30 tablet 5   sertraline (ZOLOFT) 25 MG tablet Take 25 mg by mouth daily.     zinc sulfate 220 (50 Zn) MG capsule Take 1 capsule (220 mg total) by mouth daily. 30 capsule 5    No current facility-administered medications for this visit.   Allergies:  Amoxicillin, Codeine, and Penicillins   Social History: The patient  reports that he has never smoked. He has never used smokeless tobacco. He reports that he does not drink alcohol and does not use drugs.   Family History: The patient's family history includes Diabetes in his paternal grandmother; Heart attack in his father.   ROS:  Please see the history of present illness. Otherwise, complete review of systems is positive for none.  All other systems are reviewed and negative.   Physical Exam: VS:  BP (!) 162/70   Pulse 61   Ht 5\' 6"  (1.676 m)   Wt 179 lb (81.2 kg)   SpO2 96%   BMI 28.89 kg/m , BMI Body mass index is 28.89 kg/m.  Wt Readings from Last 3 Encounters:  04/13/23 179 lb (81.2 kg)  04/25/22 169 lb (76.7 kg)  10/05/21 127 lb 6.4 oz (57.8 kg)    General: Patient appears comfortable at rest. HEENT: Conjunctiva and lids normal, oropharynx clear with moist mucosa. Neck: Supple, no elevated JVP or carotid bruits, no thyromegaly. Lungs: Clear to auscultation, nonlabored breathing at rest. Cardiac: Regular rate and rhythm, no S3 or significant systolic murmur, no pericardial rub. Abdomen: Soft, nontender, no hepatomegaly, bowel sounds present, no guarding or rebound. Extremities: No pitting edema, distal pulses 2+. Skin: Warm and dry. Musculoskeletal: No kyphosis. Neuropsychiatric: Alert and oriented x3, affect grossly appropriate.  Recent Labwork: No results found for requested labs within last 365 days.     Component Value Date/Time   CHOL 211 (A) 12/12/2018 0000   TRIG 117 12/12/2018 0000   HDL 72 (A) 12/12/2018 0000   LDLCALC 116 12/12/2018 0000     Assessment and Plan:  HCM: Patient was admitted to [facility] in 2022 with generalized weakness and encephalopathy, thought to be secondary to UTI. Echocardiogram in 2022 showed severe hypertrophy of the basal septum with SAM  and LVOT peak gradient 41 mmHg increased to 83 mmHg with Valsalva, mild aortic valve stenosis. She was seen by cardiology, felt that LVOT obstruction was worsened by dehydration.  She was started on beta-blocker and encouraged to stay hydrated, with plan for repeat echocardiogram as outpatient. Cardiac MRI in 2023 showed asymmetric LVH measuring 15 mm and basal septum and 5 mm in posterior wall, no evidence of amyloidosis although it did meet criteria for HCM. Continue metoprolol succinate 50 mg once daily.  Encouraged p.o. hydration, 40 ounces of water daily.  Does not like the taste of water per patient.  Mild AS in 2023: Repeat echocardiogram every 3 years.  History of DVT in 2022: Continue Eliquis 5 mg twice daily.  Patient is sedentary, in wheelchair and does not want to walk.  Strongly recommended physical activity.  Recommend physical therapy in the facility.  Chronic diastolic heart failure, compensated: Asymptomatic, compensated, no intervention.  I spent total duration of 30 minutes reviewing prior notes, imaging studies, face-to-face discussion/counseling of her medical conditions, evaluation, management, answering all the questions, documenting the findings in the note.   Medication Adjustments/Labs and Tests Ordered: Current medicines are reviewed at length with the patient today. Concerns regarding medicines are outlined above.    Disposition:  Follow up 1 year  Signed, Jesyka Slaght Verne Spurr, MD, 04/13/2023 9:51 AM    [facility] at [facility] [address], [place], [place] [address]

## 2023-04-13 NOTE — Patient Instructions (Signed)
Medication Instructions:  Your physician recommends that you continue on your current medications as directed. Please refer to the Current Medication list given to you today.  *If you need a refill on your cardiac medications before your next appointment, please call your pharmacy*   Lab Work: None If you have labs (blood work) drawn today and your tests are completely normal, you will receive your results only by: MyChart Message (if you have MyChart) OR A paper copy in the mail If you have any lab test that is abnormal or we need to change your treatment, we will call you to review the results.   Testing/Procedures: None   Follow-Up: At [facility], you and your health needs are our priority.  As part of our continuing mission to provide you with exceptional heart care, we have created designated Provider Care Teams.  These Care Teams include your primary Cardiologist (physician) and Advanced Practice Providers (APPs -  Physician Assistants and Nurse Practitioners) who all work together to provide you with the care you need, when you need it.  We recommend signing up for the patient portal called "MyChart".  Sign up information is provided on this After Visit Summary.  MyChart is used to connect with patients for Virtual Visits (Telemedicine).  Patients are able to view lab/test results, encounter notes, upcoming appointments, etc.  Non-urgent messages can be sent to your provider as well.   To learn more about what you can do with MyChart, go to [email or web address].    Your next appointment:   1 year(s)  Provider:   Vishnu Mallipeddi, MD    Other Instructions    

## 2023-04-17 DIAGNOSIS — W19XXXD Unspecified fall, subsequent encounter: Secondary | ICD-10-CM | POA: Diagnosis not present

## 2023-04-17 DIAGNOSIS — M810 Age-related osteoporosis without current pathological fracture: Secondary | ICD-10-CM | POA: Diagnosis not present

## 2023-04-17 DIAGNOSIS — N390 Urinary tract infection, site not specified: Secondary | ICD-10-CM | POA: Diagnosis not present

## 2023-04-17 DIAGNOSIS — Z7901 Long term (current) use of anticoagulants: Secondary | ICD-10-CM | POA: Diagnosis not present

## 2023-04-17 DIAGNOSIS — I110 Hypertensive heart disease with heart failure: Secondary | ICD-10-CM | POA: Diagnosis not present

## 2023-04-17 DIAGNOSIS — R300 Dysuria: Secondary | ICD-10-CM | POA: Diagnosis not present

## 2023-04-17 DIAGNOSIS — Z9181 History of falling: Secondary | ICD-10-CM | POA: Diagnosis not present

## 2023-04-17 DIAGNOSIS — I5032 Chronic diastolic (congestive) heart failure: Secondary | ICD-10-CM | POA: Diagnosis not present

## 2023-04-17 DIAGNOSIS — F329 Major depressive disorder, single episode, unspecified: Secondary | ICD-10-CM | POA: Diagnosis not present

## 2023-04-17 DIAGNOSIS — I350 Nonrheumatic aortic (valve) stenosis: Secondary | ICD-10-CM | POA: Diagnosis not present

## 2023-04-17 DIAGNOSIS — F419 Anxiety disorder, unspecified: Secondary | ICD-10-CM | POA: Diagnosis not present

## 2023-04-17 DIAGNOSIS — R39198 Other difficulties with micturition: Secondary | ICD-10-CM | POA: Diagnosis not present

## 2023-04-18 ENCOUNTER — Telehealth: Payer: Self-pay | Admitting: Internal Medicine

## 2023-04-18 NOTE — Telephone Encounter (Signed)
Spoke to Amy who stated that provider requested an increase in home health physical therapy. HHN is seeking approval from provider to see pt twice weekly for two weeks, then once weekly for three weeks to address overall physical, mobile functions with daily activities. Please advise.

## 2023-04-18 NOTE — Telephone Encounter (Signed)
Calling to see if the dr agrees with the plan of care for the patient. 2 times a week for 2 weeks and then 1 times a week for 3 weeks. Please advise

## 2023-04-19 DIAGNOSIS — I422 Other hypertrophic cardiomyopathy: Secondary | ICD-10-CM | POA: Diagnosis not present

## 2023-04-19 DIAGNOSIS — K219 Gastro-esophageal reflux disease without esophagitis: Secondary | ICD-10-CM | POA: Diagnosis not present

## 2023-04-19 DIAGNOSIS — E785 Hyperlipidemia, unspecified: Secondary | ICD-10-CM | POA: Diagnosis not present

## 2023-04-19 DIAGNOSIS — I10 Essential (primary) hypertension: Secondary | ICD-10-CM | POA: Diagnosis not present

## 2023-04-19 DIAGNOSIS — I350 Nonrheumatic aortic (valve) stenosis: Secondary | ICD-10-CM | POA: Diagnosis not present

## 2023-04-19 DIAGNOSIS — Z86718 Personal history of other venous thrombosis and embolism: Secondary | ICD-10-CM | POA: Diagnosis not present

## 2023-04-19 DIAGNOSIS — R413 Other amnesia: Secondary | ICD-10-CM | POA: Diagnosis not present

## 2023-04-19 DIAGNOSIS — D696 Thrombocytopenia, unspecified: Secondary | ICD-10-CM | POA: Diagnosis not present

## 2023-04-20 NOTE — Telephone Encounter (Signed)
Any notified of approval and had no questions or concerns at this time.

## 2023-04-20 NOTE — Telephone Encounter (Signed)
Amy, Physical Therapist with [facility] is following up on verbal orders. Please advise.

## 2023-04-23 DIAGNOSIS — Z9181 History of falling: Secondary | ICD-10-CM | POA: Diagnosis not present

## 2023-04-23 DIAGNOSIS — Z7901 Long term (current) use of anticoagulants: Secondary | ICD-10-CM | POA: Diagnosis not present

## 2023-04-23 DIAGNOSIS — I350 Nonrheumatic aortic (valve) stenosis: Secondary | ICD-10-CM | POA: Diagnosis not present

## 2023-04-23 DIAGNOSIS — F419 Anxiety disorder, unspecified: Secondary | ICD-10-CM | POA: Diagnosis not present

## 2023-04-23 DIAGNOSIS — W19XXXD Unspecified fall, subsequent encounter: Secondary | ICD-10-CM | POA: Diagnosis not present

## 2023-04-23 DIAGNOSIS — I110 Hypertensive heart disease with heart failure: Secondary | ICD-10-CM | POA: Diagnosis not present

## 2023-04-23 DIAGNOSIS — I5032 Chronic diastolic (congestive) heart failure: Secondary | ICD-10-CM | POA: Diagnosis not present

## 2023-04-23 DIAGNOSIS — F329 Major depressive disorder, single episode, unspecified: Secondary | ICD-10-CM | POA: Diagnosis not present

## 2023-04-23 DIAGNOSIS — M810 Age-related osteoporosis without current pathological fracture: Secondary | ICD-10-CM | POA: Diagnosis not present

## 2023-04-27 DIAGNOSIS — I5032 Chronic diastolic (congestive) heart failure: Secondary | ICD-10-CM | POA: Diagnosis not present

## 2023-04-27 DIAGNOSIS — I110 Hypertensive heart disease with heart failure: Secondary | ICD-10-CM | POA: Diagnosis not present

## 2023-04-27 DIAGNOSIS — I350 Nonrheumatic aortic (valve) stenosis: Secondary | ICD-10-CM | POA: Diagnosis not present

## 2023-04-27 DIAGNOSIS — W19XXXD Unspecified fall, subsequent encounter: Secondary | ICD-10-CM | POA: Diagnosis not present

## 2023-04-27 DIAGNOSIS — F329 Major depressive disorder, single episode, unspecified: Secondary | ICD-10-CM | POA: Diagnosis not present

## 2023-04-27 DIAGNOSIS — Z9181 History of falling: Secondary | ICD-10-CM | POA: Diagnosis not present

## 2023-04-27 DIAGNOSIS — F419 Anxiety disorder, unspecified: Secondary | ICD-10-CM | POA: Diagnosis not present

## 2023-04-27 DIAGNOSIS — Z7901 Long term (current) use of anticoagulants: Secondary | ICD-10-CM | POA: Diagnosis not present

## 2023-04-27 DIAGNOSIS — M810 Age-related osteoporosis without current pathological fracture: Secondary | ICD-10-CM | POA: Diagnosis not present

## 2023-05-01 DIAGNOSIS — Z9181 History of falling: Secondary | ICD-10-CM | POA: Diagnosis not present

## 2023-05-01 DIAGNOSIS — W19XXXD Unspecified fall, subsequent encounter: Secondary | ICD-10-CM | POA: Diagnosis not present

## 2023-05-01 DIAGNOSIS — I350 Nonrheumatic aortic (valve) stenosis: Secondary | ICD-10-CM | POA: Diagnosis not present

## 2023-05-01 DIAGNOSIS — I110 Hypertensive heart disease with heart failure: Secondary | ICD-10-CM | POA: Diagnosis not present

## 2023-05-01 DIAGNOSIS — F329 Major depressive disorder, single episode, unspecified: Secondary | ICD-10-CM | POA: Diagnosis not present

## 2023-05-01 DIAGNOSIS — I5032 Chronic diastolic (congestive) heart failure: Secondary | ICD-10-CM | POA: Diagnosis not present

## 2023-05-01 DIAGNOSIS — F419 Anxiety disorder, unspecified: Secondary | ICD-10-CM | POA: Diagnosis not present

## 2023-05-01 DIAGNOSIS — Z7901 Long term (current) use of anticoagulants: Secondary | ICD-10-CM | POA: Diagnosis not present

## 2023-05-01 DIAGNOSIS — M810 Age-related osteoporosis without current pathological fracture: Secondary | ICD-10-CM | POA: Diagnosis not present

## 2023-05-03 DIAGNOSIS — F419 Anxiety disorder, unspecified: Secondary | ICD-10-CM | POA: Diagnosis not present

## 2023-05-03 DIAGNOSIS — I5032 Chronic diastolic (congestive) heart failure: Secondary | ICD-10-CM | POA: Diagnosis not present

## 2023-05-03 DIAGNOSIS — Z7901 Long term (current) use of anticoagulants: Secondary | ICD-10-CM | POA: Diagnosis not present

## 2023-05-03 DIAGNOSIS — I110 Hypertensive heart disease with heart failure: Secondary | ICD-10-CM | POA: Diagnosis not present

## 2023-05-03 DIAGNOSIS — W19XXXD Unspecified fall, subsequent encounter: Secondary | ICD-10-CM | POA: Diagnosis not present

## 2023-05-03 DIAGNOSIS — M810 Age-related osteoporosis without current pathological fracture: Secondary | ICD-10-CM | POA: Diagnosis not present

## 2023-05-03 DIAGNOSIS — I350 Nonrheumatic aortic (valve) stenosis: Secondary | ICD-10-CM | POA: Diagnosis not present

## 2023-05-03 DIAGNOSIS — Z9181 History of falling: Secondary | ICD-10-CM | POA: Diagnosis not present

## 2023-05-03 DIAGNOSIS — F329 Major depressive disorder, single episode, unspecified: Secondary | ICD-10-CM | POA: Diagnosis not present

## 2023-05-08 DIAGNOSIS — W19XXXD Unspecified fall, subsequent encounter: Secondary | ICD-10-CM | POA: Diagnosis not present

## 2023-05-08 DIAGNOSIS — I5032 Chronic diastolic (congestive) heart failure: Secondary | ICD-10-CM | POA: Diagnosis not present

## 2023-05-08 DIAGNOSIS — Z7901 Long term (current) use of anticoagulants: Secondary | ICD-10-CM | POA: Diagnosis not present

## 2023-05-08 DIAGNOSIS — Z9181 History of falling: Secondary | ICD-10-CM | POA: Diagnosis not present

## 2023-05-08 DIAGNOSIS — I110 Hypertensive heart disease with heart failure: Secondary | ICD-10-CM | POA: Diagnosis not present

## 2023-05-08 DIAGNOSIS — M810 Age-related osteoporosis without current pathological fracture: Secondary | ICD-10-CM | POA: Diagnosis not present

## 2023-05-08 DIAGNOSIS — F419 Anxiety disorder, unspecified: Secondary | ICD-10-CM | POA: Diagnosis not present

## 2023-05-08 DIAGNOSIS — I350 Nonrheumatic aortic (valve) stenosis: Secondary | ICD-10-CM | POA: Diagnosis not present

## 2023-05-08 DIAGNOSIS — F329 Major depressive disorder, single episode, unspecified: Secondary | ICD-10-CM | POA: Diagnosis not present

## 2023-05-15 DIAGNOSIS — I5032 Chronic diastolic (congestive) heart failure: Secondary | ICD-10-CM | POA: Diagnosis not present

## 2023-05-15 DIAGNOSIS — Z7901 Long term (current) use of anticoagulants: Secondary | ICD-10-CM | POA: Diagnosis not present

## 2023-05-15 DIAGNOSIS — I350 Nonrheumatic aortic (valve) stenosis: Secondary | ICD-10-CM | POA: Diagnosis not present

## 2023-05-15 DIAGNOSIS — F329 Major depressive disorder, single episode, unspecified: Secondary | ICD-10-CM | POA: Diagnosis not present

## 2023-05-15 DIAGNOSIS — F419 Anxiety disorder, unspecified: Secondary | ICD-10-CM | POA: Diagnosis not present

## 2023-05-15 DIAGNOSIS — I110 Hypertensive heart disease with heart failure: Secondary | ICD-10-CM | POA: Diagnosis not present

## 2023-05-15 DIAGNOSIS — Z9181 History of falling: Secondary | ICD-10-CM | POA: Diagnosis not present

## 2023-05-15 DIAGNOSIS — M810 Age-related osteoporosis without current pathological fracture: Secondary | ICD-10-CM | POA: Diagnosis not present

## 2023-05-15 DIAGNOSIS — W19XXXD Unspecified fall, subsequent encounter: Secondary | ICD-10-CM | POA: Diagnosis not present

## 2023-05-21 DIAGNOSIS — M810 Age-related osteoporosis without current pathological fracture: Secondary | ICD-10-CM | POA: Diagnosis not present

## 2023-05-21 DIAGNOSIS — W19XXXD Unspecified fall, subsequent encounter: Secondary | ICD-10-CM | POA: Diagnosis not present

## 2023-05-21 DIAGNOSIS — F419 Anxiety disorder, unspecified: Secondary | ICD-10-CM | POA: Diagnosis not present

## 2023-05-21 DIAGNOSIS — Z9181 History of falling: Secondary | ICD-10-CM | POA: Diagnosis not present

## 2023-05-21 DIAGNOSIS — F329 Major depressive disorder, single episode, unspecified: Secondary | ICD-10-CM | POA: Diagnosis not present

## 2023-05-21 DIAGNOSIS — I5032 Chronic diastolic (congestive) heart failure: Secondary | ICD-10-CM | POA: Diagnosis not present

## 2023-05-21 DIAGNOSIS — Z7901 Long term (current) use of anticoagulants: Secondary | ICD-10-CM | POA: Diagnosis not present

## 2023-05-21 DIAGNOSIS — I350 Nonrheumatic aortic (valve) stenosis: Secondary | ICD-10-CM | POA: Diagnosis not present

## 2023-05-21 DIAGNOSIS — I110 Hypertensive heart disease with heart failure: Secondary | ICD-10-CM | POA: Diagnosis not present

## 2023-09-17 DIAGNOSIS — M79674 Pain in right toe(s): Secondary | ICD-10-CM | POA: Diagnosis not present

## 2023-09-17 DIAGNOSIS — B351 Tinea unguium: Secondary | ICD-10-CM | POA: Diagnosis not present

## 2023-09-17 DIAGNOSIS — M79675 Pain in left toe(s): Secondary | ICD-10-CM | POA: Diagnosis not present

## 2023-10-01 DIAGNOSIS — N390 Urinary tract infection, site not specified: Secondary | ICD-10-CM | POA: Diagnosis not present

## 2023-10-01 DIAGNOSIS — R011 Cardiac murmur, unspecified: Secondary | ICD-10-CM | POA: Diagnosis not present

## 2023-10-01 DIAGNOSIS — I10 Essential (primary) hypertension: Secondary | ICD-10-CM | POA: Diagnosis not present

## 2023-10-02 DIAGNOSIS — N939 Abnormal uterine and vaginal bleeding, unspecified: Secondary | ICD-10-CM | POA: Diagnosis not present

## 2023-10-04 ENCOUNTER — Ambulatory Visit: Admitting: Adult Health

## 2023-10-04 ENCOUNTER — Encounter: Payer: Self-pay | Admitting: Adult Health

## 2023-10-04 VITALS — BP 158/78 | HR 62 | Ht 65.0 in | Wt 175.0 lb

## 2023-10-04 DIAGNOSIS — I10 Essential (primary) hypertension: Secondary | ICD-10-CM | POA: Diagnosis not present

## 2023-10-04 DIAGNOSIS — Z1331 Encounter for screening for depression: Secondary | ICD-10-CM

## 2023-10-04 DIAGNOSIS — N950 Postmenopausal bleeding: Secondary | ICD-10-CM | POA: Diagnosis not present

## 2023-10-04 NOTE — Progress Notes (Signed)
  Subjective:     Patient ID: Tammy Terry, adult   DOB: February 11, 1941, 83 y.o.   MRN: [id number]  HPI [name] is a 83 year old white female, widowed, PM worked in for vaginal bleeding since 09/28/23, denies andy pain and blood is red. She resides at [facility], and has some mild cognitive deficit. She has history of breast cancer and DVT and is on Eliquis.  She has [name] her driver with her.  PCP is Dr Margo Aye  Review of Systems +vaginal bleeding since 09/28/23 Denies any pain Reviewed past medical,surgical, social and family history. Reviewed medications and allergies.     Objective:   Physical Exam BP (!) 158/78 (BP Location: Right Arm, Patient Position: Sitting, Cuff Size: Normal)   Pulse 62   Ht 5\' 5"  (1.651 m)   Wt 175 lb (79.4 kg)   BMI 29.12 kg/m     Skin warm and dry. Lungs: clear to ausculation bilaterally. Cardiovascular: regular rate and rhythm. Pelvic: external genitalia is normal in appearance no lesions, vagina:+red blood,urethra has no lesions or masses noted, cervix is poorly seen, but felt to be smooth, uterus: normal size, shape and contour, non tender, no masses felt, adnexa: no masses or tenderness noted. Bladder is non tender and no masses felt. AA is 0 Fall risk is low    10/04/2023   11:09 AM  Depression screen PHQ 2/9  Decreased Interest 0  Down, Depressed, Hopeless 0  PHQ - 2 Score 0  Altered sleeping 0  Tired, decreased energy 1  Change in appetite 0  Feeling bad or failure about yourself  0  Trouble concentrating 0  Moving slowly or fidgety/restless 0  Suicidal thoughts 0  PHQ-9 Score 1       10/04/2023   11:10 AM  GAD 7 : Generalized Anxiety Score  Nervous, Anxious, on Edge 0  Control/stop worrying 0  Worry too much - different things 0  Trouble relaxing 0  Restless 0  Easily annoyed or irritable 0  Afraid - awful might happen 0  Total GAD 7 Score 0      Upstream - 10/04/23 1059       Pregnancy Intention Screening   Does the patient want to  become pregnant in the next year? N/A    Does the patient's partner want to become pregnant in the next year? N/A    Would the patient like to discuss contraceptive options today? N/A      Contraception Wrap Up   Current Method Abstinence   PM   End Method Abstinence   PM   Contraception Counseling Provided No            Examination chaperoned by Malachy Mood LPN  Assessment:     1. PMB (postmenopausal bleeding) (Primary) Has had vaginal bleeding since 09/28/23, no pain  Will get pelvic US 10/09/23 in office at 10:45 am to assess uterine lining, if thickened will needed endometrial biopsy   2. Essential hypertension, benign Take meds and follow up with PCP     Plan:     Return 10/09/23 for pelvic US

## 2023-10-06 ENCOUNTER — Inpatient Hospital Stay (HOSPITAL_COMMUNITY)
Admission: EM | Admit: 2023-10-06 | Discharge: 2023-10-12 | DRG: 754 | Disposition: A | Discharge status: Hospice | Attending: Internal Medicine | Admitting: Internal Medicine

## 2023-10-06 ENCOUNTER — Emergency Department (HOSPITAL_COMMUNITY)

## 2023-10-06 ENCOUNTER — Encounter (HOSPITAL_COMMUNITY): Payer: Self-pay | Admitting: Emergency Medicine

## 2023-10-06 ENCOUNTER — Other Ambulatory Visit: Payer: Self-pay

## 2023-10-06 DIAGNOSIS — M810 Age-related osteoporosis without current pathological fracture: Secondary | ICD-10-CM | POA: Diagnosis present

## 2023-10-06 DIAGNOSIS — N950 Postmenopausal bleeding: Secondary | ICD-10-CM | POA: Diagnosis not present

## 2023-10-06 DIAGNOSIS — D62 Acute posthemorrhagic anemia: Secondary | ICD-10-CM | POA: Diagnosis present

## 2023-10-06 DIAGNOSIS — Z515 Encounter for palliative care: Secondary | ICD-10-CM

## 2023-10-06 DIAGNOSIS — K760 Fatty (change of) liver, not elsewhere classified: Secondary | ICD-10-CM | POA: Diagnosis present

## 2023-10-06 DIAGNOSIS — Z833 Family history of diabetes mellitus: Secondary | ICD-10-CM | POA: Diagnosis not present

## 2023-10-06 DIAGNOSIS — Z7901 Long term (current) use of anticoagulants: Secondary | ICD-10-CM

## 2023-10-06 DIAGNOSIS — D75839 Thrombocytosis, unspecified: Secondary | ICD-10-CM | POA: Diagnosis present

## 2023-10-06 DIAGNOSIS — E785 Hyperlipidemia, unspecified: Secondary | ICD-10-CM | POA: Diagnosis present

## 2023-10-06 DIAGNOSIS — I82411 Acute embolism and thrombosis of right femoral vein: Secondary | ICD-10-CM | POA: Diagnosis present

## 2023-10-06 DIAGNOSIS — M159 Polyosteoarthritis, unspecified: Secondary | ICD-10-CM | POA: Diagnosis present

## 2023-10-06 DIAGNOSIS — Z885 Allergy status to narcotic agent status: Secondary | ICD-10-CM

## 2023-10-06 DIAGNOSIS — I824Y9 Acute embolism and thrombosis of unspecified deep veins of unspecified proximal lower extremity: Secondary | ICD-10-CM | POA: Diagnosis not present

## 2023-10-06 DIAGNOSIS — I6389 Other cerebral infarction: Secondary | ICD-10-CM | POA: Diagnosis not present

## 2023-10-06 DIAGNOSIS — F0390 Unspecified dementia without behavioral disturbance: Secondary | ICD-10-CM | POA: Diagnosis not present

## 2023-10-06 DIAGNOSIS — R4182 Altered mental status, unspecified: Secondary | ICD-10-CM | POA: Diagnosis not present

## 2023-10-06 DIAGNOSIS — I82409 Acute embolism and thrombosis of unspecified deep veins of unspecified lower extremity: Secondary | ICD-10-CM | POA: Diagnosis present

## 2023-10-06 DIAGNOSIS — Z880 Allergy status to penicillin: Secondary | ICD-10-CM

## 2023-10-06 DIAGNOSIS — R9389 Abnormal findings on diagnostic imaging of other specified body structures: Secondary | ICD-10-CM | POA: Diagnosis not present

## 2023-10-06 DIAGNOSIS — Z923 Personal history of irradiation: Secondary | ICD-10-CM

## 2023-10-06 DIAGNOSIS — Z66 Do not resuscitate: Secondary | ICD-10-CM | POA: Diagnosis not present

## 2023-10-06 DIAGNOSIS — I82431 Acute embolism and thrombosis of right popliteal vein: Secondary | ICD-10-CM | POA: Diagnosis present

## 2023-10-06 DIAGNOSIS — Z8249 Family history of ischemic heart disease and other diseases of the circulatory system: Secondary | ICD-10-CM | POA: Diagnosis not present

## 2023-10-06 DIAGNOSIS — N939 Abnormal uterine and vaginal bleeding, unspecified: Secondary | ICD-10-CM | POA: Diagnosis present

## 2023-10-06 DIAGNOSIS — F418 Other specified anxiety disorders: Secondary | ICD-10-CM | POA: Diagnosis not present

## 2023-10-06 DIAGNOSIS — Z853 Personal history of malignant neoplasm of breast: Secondary | ICD-10-CM

## 2023-10-06 DIAGNOSIS — Z9012 Acquired absence of left breast and nipple: Secondary | ICD-10-CM

## 2023-10-06 DIAGNOSIS — C541 Malignant neoplasm of endometrium: Principal | ICD-10-CM | POA: Diagnosis present

## 2023-10-06 DIAGNOSIS — F0394 Unspecified dementia, unspecified severity, with anxiety: Secondary | ICD-10-CM | POA: Diagnosis present

## 2023-10-06 DIAGNOSIS — F329 Major depressive disorder, single episode, unspecified: Secondary | ICD-10-CM | POA: Diagnosis present

## 2023-10-06 DIAGNOSIS — Z86718 Personal history of other venous thrombosis and embolism: Secondary | ICD-10-CM | POA: Diagnosis not present

## 2023-10-06 DIAGNOSIS — I10 Essential (primary) hypertension: Secondary | ICD-10-CM | POA: Diagnosis present

## 2023-10-06 DIAGNOSIS — D696 Thrombocytopenia, unspecified: Secondary | ICD-10-CM | POA: Diagnosis present

## 2023-10-06 DIAGNOSIS — R58 Hemorrhage, not elsewhere classified: Secondary | ICD-10-CM | POA: Diagnosis not present

## 2023-10-06 DIAGNOSIS — K625 Hemorrhage of anus and rectum: Secondary | ICD-10-CM | POA: Diagnosis present

## 2023-10-06 DIAGNOSIS — Z79899 Other long term (current) drug therapy: Secondary | ICD-10-CM

## 2023-10-06 DIAGNOSIS — Q513 Bicornate uterus: Secondary | ICD-10-CM

## 2023-10-06 DIAGNOSIS — D6832 Hemorrhagic disorder due to extrinsic circulating anticoagulants: Secondary | ICD-10-CM | POA: Diagnosis present

## 2023-10-06 DIAGNOSIS — I5032 Chronic diastolic (congestive) heart failure: Secondary | ICD-10-CM | POA: Diagnosis present

## 2023-10-06 DIAGNOSIS — I110 Hypertensive heart disease with heart failure: Secondary | ICD-10-CM | POA: Diagnosis present

## 2023-10-06 DIAGNOSIS — G8194 Hemiplegia, unspecified affecting left nondominant side: Secondary | ICD-10-CM | POA: Diagnosis present

## 2023-10-06 DIAGNOSIS — I63412 Cerebral infarction due to embolism of left middle cerebral artery: Secondary | ICD-10-CM | POA: Diagnosis present

## 2023-10-06 DIAGNOSIS — F0393 Unspecified dementia, unspecified severity, with mood disturbance: Secondary | ICD-10-CM | POA: Diagnosis present

## 2023-10-06 DIAGNOSIS — R29818 Other symptoms and signs involving the nervous system: Secondary | ICD-10-CM | POA: Diagnosis not present

## 2023-10-06 DIAGNOSIS — Z7189 Other specified counseling: Secondary | ICD-10-CM | POA: Diagnosis not present

## 2023-10-06 DIAGNOSIS — R29703 NIHSS score 3: Secondary | ICD-10-CM | POA: Diagnosis present

## 2023-10-06 DIAGNOSIS — I350 Nonrheumatic aortic (valve) stenosis: Secondary | ICD-10-CM | POA: Diagnosis present

## 2023-10-06 DIAGNOSIS — I6523 Occlusion and stenosis of bilateral carotid arteries: Secondary | ICD-10-CM | POA: Diagnosis not present

## 2023-10-06 DIAGNOSIS — I639 Cerebral infarction, unspecified: Secondary | ICD-10-CM | POA: Diagnosis not present

## 2023-10-06 DIAGNOSIS — R4189 Other symptoms and signs involving cognitive functions and awareness: Secondary | ICD-10-CM | POA: Diagnosis present

## 2023-10-06 LAB — CBC WITH DIFFERENTIAL/PLATELET
Abs Immature Granulocytes: 0.02 10*3/uL (ref 0.00–0.07)
Basophils Absolute: 0 10*3/uL (ref 0.0–0.1)
Basophils Relative: 1 %
Eosinophils Absolute: 0.2 10*3/uL (ref 0.0–0.5)
Eosinophils Relative: 3 %
HCT: 29.1 % — ABNORMAL LOW (ref 36.0–46.0)
Hemoglobin: 8.9 g/dL — ABNORMAL LOW (ref 12.0–15.0)
Immature Granulocytes: 0 %
Lymphocytes Relative: 22 %
Lymphs Abs: 1.2 10*3/uL (ref 0.7–4.0)
MCH: 30.7 pg (ref 26.0–34.0)
MCHC: 30.6 g/dL (ref 30.0–36.0)
MCV: 100.3 fL — ABNORMAL HIGH (ref 80.0–100.0)
Monocytes Absolute: 0.5 10*3/uL (ref 0.1–1.0)
Monocytes Relative: 10 %
Neutro Abs: 3.5 10*3/uL (ref 1.7–7.7)
Neutrophils Relative %: 64 %
Platelets: 145 10*3/uL — ABNORMAL LOW (ref 150–400)
RBC: 2.9 MIL/uL — ABNORMAL LOW (ref 3.87–5.11)
RDW: 15.1 % (ref 11.5–15.5)
WBC: 5.5 10*3/uL (ref 4.0–10.5)
nRBC: 0 % (ref 0.0–0.2)

## 2023-10-06 LAB — COMPREHENSIVE METABOLIC PANEL WITH GFR
ALT: 12 U/L (ref 0–44)
AST: 16 U/L (ref 15–41)
Albumin: 3.4 g/dL — ABNORMAL LOW (ref 3.5–5.0)
Alkaline Phosphatase: 96 U/L (ref 38–126)
Anion gap: 9 (ref 5–15)
BUN: 23 mg/dL (ref 8–23)
CO2: 28 mmol/L (ref 22–32)
Calcium: 9.3 mg/dL (ref 8.9–10.3)
Chloride: 104 mmol/L (ref 98–111)
Creatinine, Ser: 0.81 mg/dL (ref 0.44–1.00)
GFR, Estimated: >60 mL/min (ref >60)
Glucose, Bld: 108 mg/dL — ABNORMAL HIGH (ref 70–99)
Potassium: 4.2 mmol/L (ref 3.5–5.1)
Sodium: 141 mmol/L (ref 135–145)
Total Bilirubin: 0.5 mg/dL (ref 0.0–1.2)
Total Protein: 6.6 g/dL (ref 6.5–8.1)

## 2023-10-06 LAB — RETICULOCYTES
Immature Retic Fract: 25.9 % — ABNORMAL HIGH (ref 2.3–15.9)
RBC.: 2.91 MIL/uL — ABNORMAL LOW (ref 3.87–5.11)
Retic Count, Absolute: 50.9 10*3/uL (ref 19.0–186.0)
Retic Ct Pct: 1.8 % (ref 0.4–3.1)

## 2023-10-06 LAB — PROTIME-INR
INR: 1.5 — ABNORMAL HIGH (ref 0.8–1.2)
Prothrombin Time: 18.1 s — ABNORMAL HIGH (ref 11.4–15.2)

## 2023-10-06 LAB — VITAMIN B12: Vitamin B-12: 190 pg/mL (ref 180–914)

## 2023-10-06 MED ORDER — SODIUM CHLORIDE 0.9 % IV BOLUS
500.0000 mL | Freq: Once | INTRAVENOUS | Status: AC
  Administered 2023-10-06: 500 mL via INTRAVENOUS

## 2023-10-06 MED ORDER — SODIUM CHLORIDE 0.9% IV SOLUTION: Freq: Once | INTRAVENOUS | Status: DC

## 2023-10-06 MED ORDER — EMPTY CONTAINERS FLEXIBLE MISC
900.0000 mg | Freq: Once | Status: AC
  Administered 2023-10-06: 900 mg via INTRAVENOUS
  Filled 2023-10-06: qty 90

## 2023-10-06 MED ORDER — TRANEXAMIC ACID-NACL 1000-0.7 MG/100ML-% IV SOLN
1000.0000 mg | Freq: Once | INTRAVENOUS | Status: AC
  Administered 2023-10-07: 1000 mg via INTRAVENOUS
  Filled 2023-10-06: qty 100

## 2023-10-06 NOTE — Progress Notes (Signed)
 Patient ID: [name], adult   DOB: 1940-12-02, 83 y.o.   MRN: [id number]   OB/GYN Telephone Consult  10/06/2023   Tammy Terry is a 83 y.o. G1P1 who is currently not pregnant presenting to [facility] .   I was called for a consult regarding the care of this patient by the Physician (MD/DO) caring for the patient.   The provider had the following clinical question: Vaginal bleeding, seen for same in [facility] this week. Plan for u/s She dropped her Hgb to 8.9 and continues to bleed Has h/o DVT and breast cancer and was on Eliquis. Given reversal.  The provider presented the following relevant clinical information: U/s showed bicornuate uterus with thickened lining in both horns.  I performed a chart review on the patient and reviewed available documentation.  BP (!) 156/87 (BP Location: Right Arm)   Pulse 69   Temp 98.5 F (36.9 C) (Oral)   Resp 16   Ht 5\' 5"  (1.651 m)   Wt 79.4 kg   SpO2 94%   BMI 29.13 kg/m   Exam- performed by consulting provider   Recommendations:  -TXA x 1 IV -monitor for further drop in hgb -if stable, office follow-up for EMB -Recommended MD/APP provide the patient with a referral to the Center for [facility] for follow up in  1 week.   Thank you for this consult and if additional recommendations are needed please call [phone] for the OB/GYN attending on service at [facility].   I spent approximately 10 minutes directly consulting with the provider and verbally discussing this case. Additionally 10 minutes minutes was spent performing chart review and documentation.    Granville Layer, MD

## 2023-10-06 NOTE — ED Triage Notes (Signed)
 BIB EMS from local nursing home for increased amount of blood in patient's brief. Facility staff reports bleeding started about 1 week ago and has gradually increased since then. Patient was seen by a provider when it first started and has has an ultrasound scheduled for Tuesday next week. Patient denies pain.

## 2023-10-06 NOTE — ED Provider Notes (Signed)
 [facility] AT [facility] Provider Note   CSN: [id number] Arrival date & time: 10/06/23  2001     History {Add pertinent medical, surgical, social history, OB history to HPI:1} Chief Complaint  Patient presents with   Vaginal Bleeding   Rectal Bleeding    Tammy Terry is a 83 y.o. adult.  Patient with heavy vaginal bleeding for over a week.  Patient also has a history of DVT and aortic stenosis with hypertension and heart failure.  Patient is on Eliquis.  The history is provided by the patient. No language interpreter was used.  Vaginal Bleeding Quality:  Bright red Severity:  Severe Onset quality:  Gradual Duration:  7 days Timing:  Constant Progression:  Unchanged Chronicity:  Recurrent Menstrual history:  Postmenopausal Possible pregnancy: no   Relieved by:  Nothing Associated symptoms: no abdominal pain, no back pain and no fatigue   Rectal Bleeding Associated symptoms: no abdominal pain        Home Medications Prior to Admission medications   Medication Sig Start Date End Date Taking? Authorizing Provider  acetaminophen (TYLENOL) 325 MG tablet Take 650 mg by mouth every 6 (six) hours as needed for headache.   Yes [provider]  apixaban (ELIQUIS) 5 MG TABS tablet Take 1 tablet (5 mg total) by mouth 2 (two) times daily. 03/03/21  Yes Emokpae, Courage, MD  ascorbic acid (VITAMIN C) 500 MG tablet Take 1 tablet (500 mg total) by mouth daily. 02/04/21  Yes Emokpae, Courage, MD  guaiFENesin (MUCINEX) 600 MG 12 hr tablet Take by mouth 2 (two) times daily as needed.   Yes [provider]  metoprolol succinate (TOPROL-XL) 50 MG 24 hr tablet Take 1 tablet (50 mg total) by mouth daily. Take with or immediately following a meal. 05/24/21  Yes Johnson, Clanford L, MD  Multiple Vitamin (MULTIVITAMIN) tablet Take 1 tablet by mouth daily.   Yes [provider]  nitrofurantoin, macrocrystal-monohydrate, (MACROBID) 100 MG  capsule Take 100 mg by mouth 2 (two) times daily. Staring on 10-06-23 at 0700 for "infection" per staff at [facility]   Yes [provider]  NYAMYC powder Apply topically. 09/22/23  Yes [provider]  Nystatin (GERHARDT'S BUTT CREAM) CREA Apply to perineum/groin and affected areas twice daily 05/23/21  Yes Johnson, Clanford L, MD  ondansetron (ZOFRAN) 4 MG tablet Take 4 mg by mouth every 8 (eight) hours as needed for nausea or vomiting.   Yes [provider]  pantoprazole (PROTONIX) 40 MG tablet Take 40 mg by mouth daily.   Yes [provider]  sertraline (ZOLOFT) 25 MG tablet Take 25 mg by mouth daily. 03/29/23  Yes [provider]  zinc sulfate 220 (50 Zn) MG capsule Take 1 capsule (220 mg total) by mouth daily. 02/04/21  Yes Emokpae, Courage, MD      Allergies    Amoxicillin, Codeine, and Penicillins    Review of Systems   Review of Systems  Constitutional:  Negative for appetite change and fatigue.  HENT:  Negative for congestion, ear discharge and sinus pressure.   Eyes:  Negative for discharge.  Respiratory:  Negative for cough.   Cardiovascular:  Negative for chest pain.  Gastrointestinal:  Positive for hematochezia. Negative for abdominal pain and diarrhea.  Genitourinary:  Positive for vaginal bleeding. Negative for frequency and hematuria.  Musculoskeletal:  Negative for back pain.  Skin:  Negative for rash.  Neurological:  Negative for seizures and headaches.  Psychiatric/Behavioral:  Negative for hallucinations.     Physical Exam Updated Vital Signs BP (!) 156/87 (BP Location: Right Arm)   Pulse 69   Temp 98.5 F (36.9 C) (Oral)   Resp 16   Ht 5\' 5"  (1.651 m)   Wt 79.4 kg   SpO2 94%   BMI 29.13 kg/m  Physical Exam Vitals and nursing note reviewed.  Constitutional:      Appearance: He is well-developed.  HENT:     Head: Normocephalic.     Nose: Nose normal.  Eyes:     General: No scleral icterus.     Conjunctiva/sclera: Conjunctivae normal.  Neck:     Thyroid: No thyromegaly.  Cardiovascular:     Rate and Rhythm: Normal rate and regular rhythm.     Heart sounds: No murmur heard.    No friction rub. No gallop.  Pulmonary:     Breath sounds: No stridor. No wheezing or rales.  Chest:     Chest wall: No tenderness.  Abdominal:     General: There is no distension.     Tenderness: There is no abdominal tenderness. There is no rebound.  Genitourinary:    Comments: Rectal exam heme-negative but moderate vaginal bleeding Musculoskeletal:        General: Normal range of motion.     Cervical back: Neck supple.  Lymphadenopathy:     Cervical: No cervical adenopathy.  Skin:    Findings: No erythema or rash.  Neurological:     Mental Status: He is alert and oriented to person, place, and time.     Motor: No abnormal muscle tone.     Coordination: Coordination normal.  Psychiatric:        Behavior: Behavior normal.     ED Results / Procedures / Treatments   Labs (all labs ordered are listed, but only abnormal results are displayed) Labs Reviewed  CBC WITH DIFFERENTIAL/PLATELET - Abnormal; Notable for the following components:      Result Value   RBC 2.90 (*)    Hemoglobin 8.9 (*)    HCT 29.1 (*)    MCV 100.3 (*)    Platelets 145 (*)    All other components within normal limits  COMPREHENSIVE METABOLIC PANEL WITH GFR - Abnormal; Notable for the following components:   Glucose, Bld 108 (*)    Albumin 3.4 (*)    All other components within normal limits  PROTIME-INR - Abnormal; Notable for the following components:   Prothrombin Time 18.1 (*)    INR 1.5 (*)    All other components within normal limits  RETICULOCYTES - Abnormal; Notable for the following components:   RBC. 2.91 (*)    Immature Retic Fract 25.9 (*)    All other components within normal limits  VITAMIN B12  POC OCCULT BLOOD, ED  TYPE AND SCREEN    EKG None  Radiology US  PELVIS (TRANSABDOMINAL  ONLY) Result Date: 10/06/2023 CLINICAL DATA:  Vaginal bleeding EXAM: TRANSABDOMINAL ULTRASOUND OF PELVIS TECHNIQUE: Transabdominal ultrasound examination of the pelvis was performed including evaluation of the uterus, ovaries, adnexal regions, and pelvic cul-de-sac. COMPARISON:  05/24/2015 FINDINGS: Uterus Measurements: 11.2 x 5.0 x 5.9 cm = volume: 172 mL. No fibroids or other mass visualized. Endometrium Thickness: Question septate uterus. There appears to be 2 limbs of the endometrium measuring 1.4 cm and 1.2 cm, thickened. No focal abnormality visualized. Right ovary Measurements: 1.5 x 1.1 x 1.1 cm = volume: 0.9 mL. Normal appearance/no adnexal mass. Left ovary Measurements: 1.5 x 1.0  x 2.0 cm = volume: 1.5 mL. Normal appearance/no adnexal mass. Other findings:  Trace free fluid in the cul-de-sac. IMPRESSION: There appears to be septate uterus. Both limbs of the endometrium are thickened for postmenopausal state. In the setting of post-menopausal bleeding, endometrial sampling is indicated to exclude carcinoma. If results are benign, sonohysterogram should be considered for focal lesion work-up. (Ref: Radiological Reasoning: Algorithmic Workup of Abnormal Vaginal Bleeding with Endovaginal Sonography and Sonohysterography. AJR 2008; 161:W96-04) Electronically Signed   By: Janeece Mechanic M.D.   On: 10/06/2023 22:08    Procedures Procedures  {Document cardiac monitor, telemetry assessment procedure when appropriate:1}  Medications Ordered in ED Medications  0.9 %  sodium chloride infusion (Manually program via Guardrails IV Fluids) (has no administration in time range)  tranexamic acid (CYKLOKAPRON) IVPB 1,000 mg (has no administration in time range)  sodium chloride 0.9 % bolus 500 mL (500 mLs Intravenous New Bag/Given 10/06/23 2059)  coag fact Xa recombinant (ANDEXXA) low dose infusion 900 mg (900 mg Intravenous New Bag/Given 10/06/23 2301)    ED Course/ Medical Decision Making/ A&P   {Patient on  Eliquis and having moderate vaginal bleeding for a week.  Hemoglobin has dropped approximately 2 g over about a year and a half.  I spoke with Dr. Adriana Hopping and she is recommending giving the patient TXA.  Patient was also given Andexxa.  Patient will be seen by the hospitalist for further recommendations Click here for ABCD2, HEART and other calculatorsREFRESH Note before signing :1}                              Medical Decision Making Amount and/or Complexity of Data Reviewed Labs: ordered. Radiology: ordered.  Risk Prescription drug management.   Vaginal bleeding persistent with endometrial thickening on uterus.  Disposition will be determined after the hospitalist consult    Patient was seen by the hospitalist and we will reevaluate her at 3 AM and get another hemoglobin to make sure that his stable {Document critical care time when appropriate:1} {Document review of labs and clinical decision tools ie heart score, Chads2Vasc2 etc:1}  {Document your independent review of radiology images, and any outside records:1} {Document your discussion with family members, caretakers, and with consultants:1} {Document social determinants of health affecting pt's care:1} {Document your decision making why or why not admission, treatments were needed:1} Final Clinical Impression(s) / ED Diagnoses Final diagnoses:  None    Rx / DC Orders ED Discharge Orders     None

## 2023-10-06 NOTE — ED Notes (Addendum)
 MAR from nursing home shows patient started taking macrobid for 'infection' this morning. Called facility and spoke with caregiver for patient and she confirmed that their documentation only states infection and doesn't state a source of infection.

## 2023-10-07 ENCOUNTER — Observation Stay (HOSPITAL_COMMUNITY)

## 2023-10-07 DIAGNOSIS — I824Y9 Acute embolism and thrombosis of unspecified deep veins of unspecified proximal lower extremity: Secondary | ICD-10-CM

## 2023-10-07 DIAGNOSIS — I5032 Chronic diastolic (congestive) heart failure: Secondary | ICD-10-CM

## 2023-10-07 DIAGNOSIS — N939 Abnormal uterine and vaginal bleeding, unspecified: Secondary | ICD-10-CM | POA: Diagnosis present

## 2023-10-07 DIAGNOSIS — F418 Other specified anxiety disorders: Secondary | ICD-10-CM

## 2023-10-07 DIAGNOSIS — R4182 Altered mental status, unspecified: Secondary | ICD-10-CM | POA: Diagnosis not present

## 2023-10-07 DIAGNOSIS — F329 Major depressive disorder, single episode, unspecified: Secondary | ICD-10-CM

## 2023-10-07 DIAGNOSIS — I6523 Occlusion and stenosis of bilateral carotid arteries: Secondary | ICD-10-CM | POA: Diagnosis not present

## 2023-10-07 DIAGNOSIS — I10 Essential (primary) hypertension: Secondary | ICD-10-CM

## 2023-10-07 DIAGNOSIS — I350 Nonrheumatic aortic (valve) stenosis: Secondary | ICD-10-CM | POA: Diagnosis not present

## 2023-10-07 LAB — BLOOD GAS, VENOUS
Acid-Base Excess: 1.7 mmol/L (ref 0.0–2.0)
Bicarbonate: 28.2 mmol/L — ABNORMAL HIGH (ref 20.0–28.0)
Drawn by: 7049
O2 Saturation: 45.7 %
Patient temperature: 37.6
pCO2, Ven: 52 mmHg (ref 44–60)
pH, Ven: 7.34 (ref 7.25–7.43)
pO2, Ven: <31 mmHg — CL (ref 32–45)

## 2023-10-07 LAB — CBC
HCT: 24.2 % — ABNORMAL LOW (ref 36.0–46.0)
HCT: 25.4 % — ABNORMAL LOW (ref 36.0–46.0)
HCT: 23.7 % — ABNORMAL LOW (ref 36.0–46.0)
Hemoglobin: 7.5 g/dL — ABNORMAL LOW (ref 12.0–15.0)
Hemoglobin: 7.8 g/dL — ABNORMAL LOW (ref 12.0–15.0)
Hemoglobin: 7.2 g/dL — ABNORMAL LOW (ref 12.0–15.0)
MCH: 31.1 pg (ref 26.0–34.0)
MCH: 31 pg (ref 26.0–34.0)
MCH: 31 pg (ref 26.0–34.0)
MCHC: 31 g/dL (ref 30.0–36.0)
MCHC: 30.7 g/dL (ref 30.0–36.0)
MCHC: 30.4 g/dL (ref 30.0–36.0)
MCV: 100.4 fL — ABNORMAL HIGH (ref 80.0–100.0)
MCV: 100.8 fL — ABNORMAL HIGH (ref 80.0–100.0)
MCV: 102.2 fL — ABNORMAL HIGH (ref 80.0–100.0)
Platelets: 138 10*3/uL — ABNORMAL LOW (ref 150–400)
Platelets: 139 10*3/uL — ABNORMAL LOW (ref 150–400)
Platelets: 155 10*3/uL (ref 150–400)
RBC: 2.41 MIL/uL — ABNORMAL LOW (ref 3.87–5.11)
RBC: 2.52 MIL/uL — ABNORMAL LOW (ref 3.87–5.11)
RBC: 2.32 MIL/uL — ABNORMAL LOW (ref 3.87–5.11)
RDW: 15 % (ref 11.5–15.5)
RDW: 15.2 % (ref 11.5–15.5)
RDW: 15.4 % (ref 11.5–15.5)
WBC: 6.5 10*3/uL (ref 4.0–10.5)
WBC: 7.4 10*3/uL (ref 4.0–10.5)
WBC: 10.4 10*3/uL (ref 4.0–10.5)
nRBC: 0 % (ref 0.0–0.2)
nRBC: 0 % (ref 0.0–0.2)
nRBC: 0 % (ref 0.0–0.2)

## 2023-10-07 LAB — FOLATE: Folate: 11.3 ng/mL (ref >5.9)

## 2023-10-07 LAB — FERRITIN: Ferritin: 36 ng/mL (ref 11–307)

## 2023-10-07 LAB — IRON AND TIBC
Iron: 53 ug/dL (ref 28–170)
Saturation Ratios: 19 % (ref 10.4–31.8)
TIBC: 278 ug/dL (ref 250–450)
UIBC: 225 ug/dL

## 2023-10-07 LAB — RETICULOCYTES
Immature Retic Fract: 32.3 % — ABNORMAL HIGH (ref 2.3–15.9)
RBC.: 2.45 MIL/uL — ABNORMAL LOW (ref 3.87–5.11)
Retic Count, Absolute: 44.8 10*3/uL (ref 19.0–186.0)
Retic Ct Pct: 1.8 % (ref 0.4–3.1)

## 2023-10-07 LAB — VITAMIN B12: Vitamin B-12: 168 pg/mL — ABNORMAL LOW (ref 180–914)

## 2023-10-07 MED ORDER — ONDANSETRON HCL 4 MG PO TABS: 4.0000 mg | ORAL_TABLET | Freq: Four times a day (QID) | ORAL | Status: DC | PRN

## 2023-10-07 MED ORDER — PANTOPRAZOLE SODIUM 40 MG PO TBEC
40.0000 mg | DELAYED_RELEASE_TABLET | Freq: Every day | ORAL | Status: DC
  Filled 2023-10-07: qty 1

## 2023-10-07 MED ORDER — ACETAMINOPHEN 325 MG PO TABS: 650.0000 mg | ORAL_TABLET | Freq: Four times a day (QID) | ORAL | Status: DC | PRN

## 2023-10-07 MED ORDER — LACTATED RINGERS IV SOLN: INTRAVENOUS | Status: DC

## 2023-10-07 MED ORDER — LACTATED RINGERS IV SOLN: INTRAVENOUS | Status: AC

## 2023-10-07 MED ORDER — ENSURE ENLIVE PO LIQD: 237.0000 mL | Freq: Two times a day (BID) | ORAL | Status: DC

## 2023-10-07 MED ORDER — ACETAMINOPHEN 650 MG RE SUPP
650.0000 mg | Freq: Four times a day (QID) | RECTAL | Status: DC | PRN
  Administered 2023-10-07: 650 mg via RECTAL
  Filled 2023-10-07: qty 1

## 2023-10-07 MED ORDER — ONDANSETRON HCL 4 MG/2ML IJ SOLN: 4.0000 mg | Freq: Four times a day (QID) | INTRAMUSCULAR | Status: DC | PRN

## 2023-10-07 MED ORDER — NITROFURANTOIN MONOHYD MACRO 100 MG PO CAPS
100.0000 mg | ORAL_CAPSULE | Freq: Two times a day (BID) | ORAL | Status: DC
  Filled 2023-10-07: qty 1

## 2023-10-07 MED ORDER — ONDANSETRON HCL 4 MG/2ML IJ SOLN
4.0000 mg | Freq: Once | INTRAMUSCULAR | Status: AC
  Administered 2023-10-07: 4 mg via INTRAVENOUS
  Filled 2023-10-07: qty 2

## 2023-10-07 MED ORDER — POLYETHYLENE GLYCOL 3350 17 G PO PACK: 17.0000 g | PACK | Freq: Every day | ORAL | Status: DC | PRN

## 2023-10-07 MED ORDER — TRANEXAMIC ACID-NACL 1000-0.7 MG/100ML-% IV SOLN
1000.0000 mg | Freq: Once | INTRAVENOUS | Status: AC
  Administered 2023-10-07: 1000 mg via INTRAVENOUS
  Filled 2023-10-07: qty 100

## 2023-10-07 NOTE — Care Management Obs Status (Signed)
 MEDICARE OBSERVATION STATUS NOTIFICATION   Patient Details  Name: TAIYANA KISSLER MRN: [id number] Date of Birth: 07-13-1940   Medicare Observation Status Notification Given:   Yes.    Lynda Sands, RN 10/07/2023, 11:51 AM

## 2023-10-07 NOTE — ED Notes (Signed)
 Shelvy Dickens from [facility] called to get update on pt.

## 2023-10-07 NOTE — Progress Notes (Signed)
 PROGRESS NOTE   Tammy Terry  YQM:[id number] DOB: May 27, 1941 DOA: 10/06/2023 PCP: Omie Bickers, MD   Chief Complaint  Patient presents with   Vaginal Bleeding   Rectal Bleeding   Level of care: Telemetry  Brief Admission History:  83 y.o. adult with medical history significant for DVT, aortic stenosis, hypertension, diastolic CHF.  Patient presented to the ED with complaints of vaginal bleeding over the past week, that has been increasing.  Patient has cognitive deficits, and is unable to give details of how much blood she saw.  But she is able to answer simple questions, she denies dizziness, no difficulty breathing, no chest pain.    ED Course: blood pressure 130s to 150s.  Heart rate 64-72.  Hemoglobin 8.9.  Transabdominal pelvic ultrasound shows septate uterus, both limbs of endometrium are thickened for postmenopausal state.    Gynecology was consulted, recommendations: tranexamic acid x 1 dose given -monitor for further drop in hgb -if stable, office follow-up for EMB -Recommended MD/APP provide the patient with a referral to the [facility] for [facility] for follow up in 1 week.   Initial plan was to repeat blood counts discharged to nursing home, but on repeat hemoglobin dropped further to 7.5 hence hospitalization requested.   Assessment and Plan:  Postmenopausal vaginal bleeding - pt continue to have large blood clots  - s/p 1 dose of tranexamic acid on 4/13 - repeat 1 dose today - stat CBC ordered - recheck cBC in AM  - when hemoglobin stabilizes plan outpatient follow up with [facility]  History of DVT  - s/p reversal of apixaban given in ED - hold apixaban for now  Aortic stenosis - stable   Chronic HFpEF - stable and compensated - 2023 LVEF>75% with grade 1 DD  Dementia  - pt has significant cognitive deficits and resides in memory care - delirium precautions advised   DVT prophylaxis: SCDs Code Status: Full  Family  Communication:  Disposition: return to memory care when medically stabilized    Consultants:  OB/Gyn   Procedures:  Vaginal US   Antimicrobials:    Subjective: Pt has baseline cognitive deficits and unable to give much history; RN reported large vaginal blood clot a little over tennis ball size around 2:30 pm.  Objective: Vitals:   10/07/23 1100 10/07/23 1429 10/07/23 1431 10/07/23 1432  BP: 133/62 (!) 108/47  (!) 109/47  Pulse:  87  86  Resp: 18 (!) 23 16 19   Temp:   98.1 F (36.7 C)   TempSrc:   Axillary   SpO2:  93%  93%  Weight:      Height:        Intake/Output Summary (Last 24 hours) at 10/07/2023 1501 Last data filed at 10/06/2023 2140 Gross per 24 hour  Intake 500 ml  Output --  Net 500 ml   Filed Weights   10/06/23 2014  Weight: 79.4 kg   Examination:  General exam: Appears calm and comfortable, NAD. Diminished cognition.  Respiratory system: Clear to auscultation. Respiratory effort normal. Cardiovascular system: normal S1 & S2 heard. No JVD, murmurs, rubs, gallops or clicks. No pedal edema. Gastrointestinal system: Abdomen is nondistended, soft and nontender. No organomegaly or masses felt. Normal bowel sounds heard. Central nervous system: somnolent but arousable. No focal neurological deficits. Extremities: Symmetric 5 x 5 power. Skin: No rashes, lesions or ulcers. Psychiatry: Judgement and insight severely diminished. Mood & affect flat.   Data Reviewed: I have personally reviewed  following labs and imaging studies  CBC: Recent Labs  Lab 10/06/23 2053 10/07/23 0302 10/07/23 1022  WBC 5.5 6.5 7.4  NEUTROABS 3.5  --   --   HGB 8.9* 7.5* 7.8*  HCT 29.1* 24.2* 25.4*  MCV 100.3* 100.4* 100.8*  PLT 145* 138* 139*    Basic Metabolic Panel: Recent Labs  Lab 10/06/23 2053  NA 141  K 4.2  CL 104  CO2 28  GLUCOSE 108*  BUN 23  CREATININE 0.81  CALCIUM 9.3    CBG: No results for input(s): "GLUCAP" in the last 168 hours.  No results  found for this or any previous visit (from the past 240 hours).   Radiology Studies: US  PELVIS (TRANSABDOMINAL ONLY) Result Date: 10/06/2023 CLINICAL DATA:  Vaginal bleeding EXAM: TRANSABDOMINAL ULTRASOUND OF PELVIS TECHNIQUE: Transabdominal ultrasound examination of the pelvis was performed including evaluation of the uterus, ovaries, adnexal regions, and pelvic cul-de-sac. COMPARISON:  05/24/2015 FINDINGS: Uterus Measurements: 11.2 x 5.0 x 5.9 cm = volume: 172 mL. No fibroids or other mass visualized. Endometrium Thickness: Question septate uterus. There appears to be 2 limbs of the endometrium measuring 1.4 cm and 1.2 cm, thickened. No focal abnormality visualized. Right ovary Measurements: 1.5 x 1.1 x 1.1 cm = volume: 0.9 mL. Normal appearance/no adnexal mass. Left ovary Measurements: 1.5 x 1.0 x 2.0 cm = volume: 1.5 mL. Normal appearance/no adnexal mass. Other findings:  Trace free fluid in the cul-de-sac. IMPRESSION: There appears to be septate uterus. Both limbs of the endometrium are thickened for postmenopausal state. In the setting of post-menopausal bleeding, endometrial sampling is indicated to exclude carcinoma. If results are benign, sonohysterogram should be considered for focal lesion work-up. (Ref: Radiological Reasoning: Algorithmic Workup of Abnormal Vaginal Bleeding with Endovaginal Sonography and Sonohysterography. AJR 2008; [id number]:[id number]) Electronically Signed   By: Janeece Mechanic M.D.   On: 10/06/2023 22:08    Scheduled Meds:  sodium chloride   Intravenous Once   pantoprazole  40 mg Oral Daily   Continuous Infusions:  tranexamic acid 1,000 mg (10/07/23 1456)     LOS: 0 days   Time spent: 55 mins  Farooq Petrovich Lincoln Renshaw, MD How to contact the [facility] Attending or Consulting provider 7A - 7P or covering provider during after hours 7P -7A, for this patient?  Check the care team in [facility] and look for a) attending/consulting [facility] provider listed and b) the [facility] team listed Log into [email or web address]  to find provider on call.  Locate the [facility] provider you are looking for under [facility] and page to a number that you can be directly reached. If you still have difficulty reaching the provider, please page the [facility] (Director on Call) for the Hospitalists listed on amion for assistance.  10/07/2023, 3:01 PM

## 2023-10-07 NOTE — ED Provider Notes (Signed)
 Care of patient assumed from Dr. Zammit.  This patient presents from nursing facility for blood found in brief.  This does appear to be vaginal bleeding.  She received Andexxa, TXA.  OB/GYN was consulted who recommends monitoring for further drop in hemoglobin.  If stable, she can follow-up in the office in 1 week.  Hospitalist also saw the patient and agrees with that plan.  Plan will be for CBC at 3 AM.  If no further significant drop in hemoglobin, patient can discharge back to nursing facility. Physical Exam  BP 138/77   Pulse 65   Temp 98.5 F (36.9 C) (Oral)   Resp 16   Ht 5\' 5"  (1.651 m)   Wt 79.4 kg   SpO2 97%   BMI 29.13 kg/m   Physical Exam Vitals and nursing note reviewed.  Constitutional:      General: He is not in acute distress.    Appearance: Normal appearance. He is well-developed. He is not ill-appearing, toxic-appearing or diaphoretic.  HENT:     Head: Normocephalic and atraumatic.     Right Ear: External ear normal.     Left Ear: External ear normal.     Nose: Nose normal.  Cardiovascular:     Rate and Rhythm: Normal rate and regular rhythm.  Pulmonary:     Effort: Pulmonary effort is normal. No respiratory distress.  Abdominal:     General: Abdomen is flat. There is no distension.  Skin:    General: Skin is warm and dry.     Coloration: Skin is not jaundiced or pale.     Procedures  Procedures  ED Course / MDM    Medical Decision Making Amount and/or Complexity of Data Reviewed Labs: ordered. Radiology: ordered.  Risk Prescription drug management. Decision regarding hospitalization.   On assessment, patient sleeping with son at bedside.  Repeat hemoglobin showed a drop from 8.9 g/dL to 7.5.  Patient was admitted for observation.       Iva Mariner, MD 10/07/23 [phone]

## 2023-10-07 NOTE — ED Notes (Signed)
 Changed pt bed linen and brief with assistance from tech. Removed blood clot larger than a golf ball but smaller than a baseball from vaginal area. Hospitalist notified and he ordered more bloodwork and gave the ok for her to still move upstairs.

## 2023-10-07 NOTE — ED Notes (Signed)
 In with Dr. Bryna Car to exam patient for source of bleeding. Patient found to be bleeding from her vagina.

## 2023-10-07 NOTE — Consult Note (Deleted)
 Initial Consultation Note   Patient: Tammy Terry JXB:[id number] DOB: [birth year] PCP: Omie Bickers, MD DOA: 10/06/2023 DOS: the patient was seen and examined on 10/07/2023 Primary service: Cheyenne Cotta, MD  Referring physician: Dr. Bryna Car  Reason for consult: Vaginal Bleeding  Assessment/Plan:  Vaginal bleeding-of 1 week duration per reports increasing in severity, patient does not remember.  Patient's son at bedside.  Was seen in OB/GYN clinic- 4/10 for same, she was to get outpatient pelvic ultrasound.  This has been done in the ED she is taking the endometrium.  Hemoglobin today 8.9 - mild drop below baseline of 9-10.  Vitals stable.  She denies dyspnea or dizziness. -GYN consulted, recommend monitoring hemoglobin for further drop, TXA x 1 IV given, patient needs to have schedule an appointment to follow-up in office in 1 week for EMB. -Patient is status post TXA, Kcentra/Praxbind given in ED -At this time, patient is not meeting criteria for admission, she should remain in the ED and get CBC rechecked 6 hours from last hemoglobin, if stable, patient should be discharged back to nursing home, she should have her hemoglobin rechecked in 2 days. - Eliquis should be held till follow-up with Gyn and primary care provider.   - Nursing home to ensure that patient has an appointment to follow-up with GYN in 1 week for EMB. -I have communicated this to patient, her son-Thomas at bedside, and ED provider.  EDP with determine disposition.  DVT-diagnosed 2022.  Hold Eliquis for now in the setting of menopausal bleed.   Diastolic CHF/aortic stenosis-stable.  Cognitive deficits- per son- significant Short-Term Memory Deficits, but able to answer simple question, recognizes family.  Mental status currently at Baseline.   TRH will sign off at present, please call us  again when needed.  HPI: Tammy Terry is a 83 y.o. adult with past medical history of DVT, aortic stenosis, hypertension, diastolic  CHF. Patient presented to the ED with complaints of vaginal bleeding over the past week, that has been increasing.  Patient has cognitive deficits, and is unable to give details of how much blood she saw.  But she is able to answer simple questions, she denies dizziness, no difficulty breathing, no chest pain.   ED course-blood pressure 130s to 150s.  Heart rate 64-72.  Hemoglobin 8.9.  Transabdominal pelvic ultrasound shows septate uterus, both limbs of endometrium are thickened for postmenopausal state.    Gynecology was consulted,TXA x 1 IV -monitor for further drop in hgb -if stable, office follow-up for EMB -Recommended MD/APP provide the patient with a referral to the Center for [facility] for follow up in  1 week.  Review of Systems: As mentioned in the history of present illness. All other systems reviewed and are negative. Past Medical History:  Diagnosis Date   Acquired absence of unspecified breast and nipple    Age-related osteoporosis without current pathological fracture    Age-related osteoporosis without current pathological fracture    Anxiety    Anxiety disorder, unspecified    Cancer (HCC) 1994   BREAST CANCER, TREATED WITH RADIATION AND CHEMOTHERAPY   Depression with anxiety 03/01/2012   Essential (primary) hypertension    Fatty (change of) liver, not elsewhere classified    Fatty liver    H/O vitamin D deficiency    Hyperlipidemia 03/01/2012   Hypertension    Insomnia, unspecified    Localized edema    Lumbago    Major depressive disorder, single episode, unspecified    Malignant  neoplasm of unspecified site of unspecified female breast (HCC)    Osteoporosis    Other abnormalities of gait and mobility    Polyosteoarthritis, unspecified    Radiculopathy, site unspecified    Rosacea    Syncope and collapse    Tremor, unspecified    Unspecified convulsions (HCC)    Unspecified fall, initial encounter    Past Surgical History:  Procedure  Laterality Date   BREAST SURGERY  1994   LEFT MASTECTOMY    CAPSULOTOMY  august 2015   of left eye   CATARACT EXTRACTION Left    CESAREAN SECTION     COLONOSCOPY N/A 05/13/2014   Procedure: COLONOSCOPY;  Surgeon: Ruby Corporal, MD;  Location: [facility];  Service: Endoscopy;  Laterality: N/A;  930   MASTECTOMY     left breast for cancer in 1994   TONSILLECTOMY     Social History:  reports that he has never smoked. He has never used smokeless tobacco. He reports that he does not drink alcohol and does not use drugs.  Allergies  Allergen Reactions   Amoxicillin Other (See Comments)    Unknown reaction   Codeine     Unknown reaction   Penicillins     Long time ago . But no anaphylaxis    Family History  Problem Relation Age of Onset   Diabetes Paternal Grandmother    Heart attack Father     Prior to Admission medications   Medication Sig Start Date End Date Taking? Authorizing Provider  acetaminophen (TYLENOL) 325 MG tablet Take 650 mg by mouth every 6 (six) hours as needed for headache.   Yes [provider]  apixaban (ELIQUIS) 5 MG TABS tablet Take 1 tablet (5 mg total) by mouth 2 (two) times daily. 03/03/21  Yes Shakeel Disney, Courage, MD  ascorbic acid (VITAMIN C) 500 MG tablet Take 1 tablet (500 mg total) by mouth daily. 02/04/21  Yes Athina Fahey, Courage, MD  guaiFENesin (MUCINEX) 600 MG 12 hr tablet Take by mouth 2 (two) times daily as needed.   Yes [provider]  metoprolol succinate (TOPROL-XL) 50 MG 24 hr tablet Take 1 tablet (50 mg total) by mouth daily. Take with or immediately following a meal. 05/24/21  Yes Johnson, Clanford L, MD  Multiple Vitamin (MULTIVITAMIN) tablet Take 1 tablet by mouth daily.   Yes [provider]  nitrofurantoin, macrocrystal-monohydrate, (MACROBID) 100 MG capsule Take 100 mg by mouth 2 (two) times daily. Staring on 10-06-23 at 0700 for "infection" per staff at [facility]   Yes [provider]   NYAMYC powder Apply topically. 09/22/23  Yes [provider]  Nystatin (GERHARDT'S BUTT CREAM) CREA Apply to perineum/groin and affected areas twice daily 05/23/21  Yes Johnson, Clanford L, MD  ondansetron (ZOFRAN) 4 MG tablet Take 4 mg by mouth every 8 (eight) hours as needed for nausea or vomiting.   Yes [provider]  pantoprazole (PROTONIX) 40 MG tablet Take 40 mg by mouth daily.   Yes [provider]  sertraline (ZOLOFT) 25 MG tablet Take 25 mg by mouth daily. 03/29/23  Yes [provider]  zinc sulfate 220 (50 Zn) MG capsule Take 1 capsule (220 mg total) by mouth daily. 02/04/21  Yes Colin Dawley, MD    Physical Exam: Vitals:   10/06/23 2014 10/06/23 2100 10/06/23 2300 10/07/23 0000  BP:  (!) 157/62 137/73 138/77  Pulse:  64 72 65  Resp:      Temp:  TempSrc:      SpO2:  96% 94% 97%  Weight: 79.4 kg     Height: 5\' 5"  (1.651 m)       Constitutional: NAD, calm, comfortable Eyes: PERRL, lids and conjunctivae normal ENMT: Mucous membranes are moist.  Neck: normal, supple, no masses, no thyromegaly Respiratory: clear to auscultation bilaterally, no wheezing, no crackles. Normal respiratory effort. No accessory muscle use.  Cardiovascular: Regular rate and rhythm, no murmurs / rubs / gallops. No extremity edema.  Extremities warm Abdomen: no tenderness, no masses palpated. No hepatosplenomegaly. Bowel sounds positive.  Musculoskeletal: no clubbing / cyanosis. No joint deformity upper and lower extremities.  Skin: no rashes, lesions, ulcers. No induration Neurologic: No facial asymmetry, moving extremities spontaneously, speech fluent Psychiatric: Normal judgment and insight. Alert and oriented x 3. Normal mood.   Family Communication: Patient's son Andy Bannister at bedside. Primary team communication: EDP Thank you very much for involving us  in the care of your patient.  Author: Pati Bonine, MD 10/07/2023 1:35 AM  For on call  review [email or web address].

## 2023-10-07 NOTE — Hospital Course (Addendum)
 83 y.o. adult with medical history significant for DVT on apixaban , aortic stenosis, hypertension, diastolic CHF.  Patient presented to the ED with complaints of vaginal bleeding over the past week, that has been increasing.  Patient has cognitive deficits, and is unable to give details of how much blood she saw.  But she is able to answer simple questions, she denies dizziness, no difficulty breathing, no chest pain.    Apixaban  was reversed in the ED Gynecology was consulted, recommendations: tranexamic acid  given She continued to bleed, Hg dropped to 5.7. PRBC transfusion given (2 units) AMS worsened and CT and MRI demonstrated acute large right MCA CVA Pt continued to have heavy vaginal bleeding.  Dr. Randolm Butte consulted, felt she likely has endometrial carcinoma with limited treatment options to stop vaginal bleeding.  Palliative medicine consulted and patient was transitioned to full comfort measures on 10/09/23.   The patient had poor po intake and continued to have vaginal bleeding.  Full comfort measures were continued.  On 10/12/23, patient was evaluated by [facility] whom  felt patient was an appropriate candidate for residential hospice.  Patient remained hemodynamically stable to transfer to [facility] with which son agreeded.

## 2023-10-07 NOTE — ED Notes (Signed)
 Instructed by hospitalist to hold off on PO meds until "patient is awake"

## 2023-10-07 NOTE — Plan of Care (Signed)
°  Problem: Clinical Measurements: °Goal: Ability to maintain clinical measurements within normal limits will improve °Outcome: Progressing °  °Problem: Clinical Measurements: °Goal: Diagnostic test results will improve °Outcome: Progressing °  °Problem: Nutrition: °Goal: Adequate nutrition will be maintained °Outcome: Progressing °  °

## 2023-10-07 NOTE — H&P (Signed)
 History and Physical    Tammy Terry VWU:[id number] DOB: January 29, 1941 DOA: 10/06/2023  PCP: Omie Bickers, MD  Patient coming from: Nursing home  I have personally briefly reviewed patient's old medical records in [facility]  Chief Complaint: Vaginal bleeding  HPI: Tammy Terry is a 83 y.o. adult with medical history significant for DVT, aortic stenosis, hypertension, diastolic CHF. Patient presented to the ED with complaints of vaginal bleeding over the past week, that has been increasing.  Patient has cognitive deficits, and is unable to give details of how much blood she saw.  But she is able to answer simple questions, she denies dizziness, no difficulty breathing, no chest pain.   ED Course: blood pressure 130s to 150s.  Heart rate 64-72.  Hemoglobin 8.9.  Transabdominal pelvic ultrasound shows septate uterus, both limbs of endometrium are thickened for postmenopausal state.    Gynecology was consulted,TXA x 1 IV -monitor for further drop in hgb -if stable, office follow-up for EMB -Recommended MD/APP provide the patient with a referral to the Center for [facility] for follow up in  1 week.  Initial plan was to repeat blood counts discharged to nursing home, but on repeat hemoglobin dropped further to 7.5 hence hospitalization requested.  Review of Systems: As per HPI all other systems reviewed and negative.  Past Medical History:  Diagnosis Date   Acquired absence of unspecified breast and nipple    Age-related osteoporosis without current pathological fracture    Age-related osteoporosis without current pathological fracture    Anxiety    Anxiety disorder, unspecified    Cancer (HCC) 1994   BREAST CANCER, TREATED WITH RADIATION AND CHEMOTHERAPY   Depression with anxiety 03/01/2012   Essential (primary) hypertension    Fatty (change of) liver, not elsewhere classified    Fatty liver    H/O vitamin D deficiency    Hyperlipidemia 03/01/2012    Hypertension    Insomnia, unspecified    Localized edema    Lumbago    Major depressive disorder, single episode, unspecified    Malignant neoplasm of unspecified site of unspecified female breast (HCC)    Osteoporosis    Other abnormalities of gait and mobility    Polyosteoarthritis, unspecified    Radiculopathy, site unspecified    Rosacea    Syncope and collapse    Tremor, unspecified    Unspecified convulsions (HCC)    Unspecified fall, initial encounter     Past Surgical History:  Procedure Laterality Date   BREAST SURGERY  1994   LEFT MASTECTOMY    CAPSULOTOMY  august 2015   of left eye   CATARACT EXTRACTION Left    CESAREAN SECTION     COLONOSCOPY N/A 05/13/2014   Procedure: COLONOSCOPY;  Surgeon: Ruby Corporal, MD;  Location: [facility];  Service: Endoscopy;  Laterality: N/A;  930   MASTECTOMY     left breast for cancer in 1994   TONSILLECTOMY       reports that he has never smoked. He has never used smokeless tobacco. He reports that he does not drink alcohol and does not use drugs.  Allergies  Allergen Reactions   Amoxicillin Other (See Comments)    Unknown reaction   Codeine     Unknown reaction   Penicillins     Long time ago . But no anaphylaxis    Family History  Problem Relation Age of Onset   Diabetes Paternal Grandmother    Heart attack  Father    Prior to Admission medications   Medication Sig Start Date End Date Taking? Authorizing Provider  acetaminophen (TYLENOL) 325 MG tablet Take 650 mg by mouth every 6 (six) hours as needed for headache.   Yes [provider]  apixaban (ELIQUIS) 5 MG TABS tablet Take 1 tablet (5 mg total) by mouth 2 (two) times daily. 03/03/21  Yes Marketta Valadez, Courage, MD  ascorbic acid (VITAMIN C) 500 MG tablet Take 1 tablet (500 mg total) by mouth daily. 02/04/21  Yes Ayeden Gladman, Courage, MD  guaiFENesin (MUCINEX) 600 MG 12 hr tablet Take by mouth 2 (two) times daily as needed.   Yes [provider]   metoprolol succinate (TOPROL-XL) 50 MG 24 hr tablet Take 1 tablet (50 mg total) by mouth daily. Take with or immediately following a meal. 05/24/21  Yes Johnson, Clanford L, MD  Multiple Vitamin (MULTIVITAMIN) tablet Take 1 tablet by mouth daily.   Yes [provider]  nitrofurantoin, macrocrystal-monohydrate, (MACROBID) 100 MG capsule Take 100 mg by mouth 2 (two) times daily. Staring on 10-06-23 at 0700 for "infection" per staff at [facility]   Yes [provider]  NYAMYC powder Apply topically. 09/22/23  Yes [provider]  Nystatin (GERHARDT'S BUTT CREAM) CREA Apply to perineum/groin and affected areas twice daily 05/23/21  Yes Johnson, Clanford L, MD  ondansetron (ZOFRAN) 4 MG tablet Take 4 mg by mouth every 8 (eight) hours as needed for nausea or vomiting.   Yes [provider]  pantoprazole (PROTONIX) 40 MG tablet Take 40 mg by mouth daily.   Yes [provider]  sertraline (ZOLOFT) 25 MG tablet Take 25 mg by mouth daily. 03/29/23  Yes [provider]  zinc sulfate 220 (50 Zn) MG capsule Take 1 capsule (220 mg total) by mouth daily. 02/04/21  Yes Colin Dawley, MD    Physical Exam: Vitals:   10/07/23 0100 10/07/23 0200 10/07/23 0252 10/07/23 0400  BP: (!) 140/119 (!) 129/51  107/62  Pulse: 67     Resp: 17 (!) 21  16  Temp:   98.4 F (36.9 C)   TempSrc:   Axillary   SpO2: 96%     Weight:      Height:        Constitutional: NAD, calm, comfortable Vitals:   10/07/23 0100 10/07/23 0200 10/07/23 0252 10/07/23 0400  BP: (!) 140/119 (!) 129/51  107/62  Pulse: 67     Resp: 17 (!) 21  16  Temp:   98.4 F (36.9 C)   TempSrc:   Axillary   SpO2: 96%     Weight:      Height:       Eyes: PERRL, lids and conjunctivae normal ENMT: Mucous membranes are moist. Neck: normal, supple, no masses, no thyromegaly Respiratory: clear to auscultation bilaterally, no wheezing, no crackles. Normal respiratory effort. No  accessory muscle use.  Cardiovascular: Regular rate and rhythm, no murmurs / rubs / gallops. No extremity edema.  Abdomen: no tenderness, no masses palpated. No hepatosplenomegaly. Bowel sounds positive.  Musculoskeletal: no clubbing / cyanosis. No joint deformity upper and lower extremities.  Skin: no rashes, lesions, ulcers. No induration Neurologic: No facial asymmetry, moving extremities spontaneously, speech fluent  Psychiatric: Normal judgment and insight. Alert and oriented x 3. Normal mood.   Labs on Admission: I have personally reviewed following labs and imaging studies  CBC: Recent Labs  Lab 10/06/23 2053 10/07/23 0302  WBC 5.5 6.5  NEUTROABS 3.5  --  HGB 8.9* 7.5*  HCT 29.1* 24.2*  MCV 100.3* 100.4*  PLT 145* 138*   Basic Metabolic Panel: Recent Labs  Lab 10/06/23 2053  NA 141  K 4.2  CL 104  CO2 28  GLUCOSE 108*  BUN 23  CREATININE 0.81  CALCIUM 9.3   GFR: Estimated Creatinine Clearance (by C-G formula based on SCr of 0.81 mg/dL) Female: 16.1 mL/min Female: 66 mL/min Liver Function Tests: Recent Labs  Lab 10/06/23 2053  AST 16  ALT 12  ALKPHOS 96  BILITOT 0.5  PROT 6.6  ALBUMIN 3.4*   Coagulation Profile: Recent Labs  Lab 10/06/23 2053  INR 1.5*   Anemia Panel: Recent Labs    10/06/23 2053  VITAMINB12 190  RETICCTPCT 1.8   Urine analysis:    Component Value Date/Time   COLORURINE YELLOW 05/20/2021 1834   APPEARANCEUR HAZY (A) 05/20/2021 1834   LABSPEC >1.030 (H) 05/20/2021 1834   PHURINE 5.5 05/20/2021 1834   GLUCOSEU NEGATIVE 05/20/2021 1834   HGBUR SMALL (A) 05/20/2021 1834   BILIRUBINUR NEGATIVE 05/20/2021 1834   KETONESUR NEGATIVE 05/20/2021 1834   PROTEINUR NEGATIVE 05/20/2021 1834   NITRITE NEGATIVE 05/20/2021 1834   LEUKOCYTESUR MODERATE (A) 05/20/2021 1834    Radiological Exams on Admission: US  PELVIS (TRANSABDOMINAL ONLY) Result Date: 10/06/2023 CLINICAL DATA:  Vaginal bleeding EXAM: TRANSABDOMINAL ULTRASOUND OF  PELVIS TECHNIQUE: Transabdominal ultrasound examination of the pelvis was performed including evaluation of the uterus, ovaries, adnexal regions, and pelvic cul-de-sac. COMPARISON:  05/24/2015 FINDINGS: Uterus Measurements: 11.2 x 5.0 x 5.9 cm = volume: 172 mL. No fibroids or other mass visualized. Endometrium Thickness: Question septate uterus. There appears to be 2 limbs of the endometrium measuring 1.4 cm and 1.2 cm, thickened. No focal abnormality visualized. Right ovary Measurements: 1.5 x 1.1 x 1.1 cm = volume: 0.9 mL. Normal appearance/no adnexal mass. Left ovary Measurements: 1.5 x 1.0 x 2.0 cm = volume: 1.5 mL. Normal appearance/no adnexal mass. Other findings:  Trace free fluid in the cul-de-sac. IMPRESSION: There appears to be septate uterus. Both limbs of the endometrium are thickened for postmenopausal state. In the setting of post-menopausal bleeding, endometrial sampling is indicated to exclude carcinoma. If results are benign, sonohysterogram should be considered for focal lesion work-up. (Ref: Radiological Reasoning: Algorithmic Workup of Abnormal Vaginal Bleeding with Endovaginal Sonography and Sonohysterography. AJR 2008; 096:E45-40) Electronically Signed   By: Janeece Mechanic M.D.   On: 10/06/2023 22:08    EKG: none  Assessment/Plan Principal Problem:   Vaginal bleeding Active Problems:   Rt Leg DVT (deep venous thrombosis)/Femoral Vein and Popliteal Vein   Depression with anxiety   Major depressive disorder, single episode, unspecified   Essential (primary) hypertension   Aortic stenosis   Chronic diastolic heart failure (HCC)   Assessment and Plan: Vaginal bleeding-of 1 week duration per reports increasing in severity, patient does not remember.  Patient's son at bedside.  Was seen in OB/GYN clinic- 4/10 for same, she was to get outpatient pelvic ultrasound.  This has been done in the ED she thickened endometrium.  Hemoglobin today 8.9 - mild drop below baseline of 9-10.  Vitals  stable.  She denies dyspnea or dizziness. -GYN consulted, recommend monitoring hemoglobin for further drop, TXA x 1 IV given, patient needs to have schedule an appointment to follow-up in office in 1 week for EMB. -Patient is status post TXA, Kcentra/Praxbind given in ED -Initial plan was to discharge patient from the ED if hemoglobin stable on recheck 6 hours after last  CBC.  But hemoglobin dropped further to 7.5 hence patient hospitalized. -Hold Eliquis - hydrate LR 75cc/hr x 15hrs - trend H and H.   DVT-diagnosed 2022.  Hold Eliquis for now in the setting of post-menopausal bleed.    Diastolic CHF/aortic stenosis-stable.  Not on diuretics.  Last echo 2023 EF > 75%, grade 1 DD.   Cognitive deficits- per son- significant Short-Term Memory Deficits, but able to answer simple question, recognizes family.  Mental status currently at Baseline.   DVT prophylaxis: SCDS Code Status: FULL code-confirmed with patient, and son Andy Bannister at bedside. Family Communication: Confirmed with patient, and son Andy Bannister at bedside. Disposition Plan: ~ 1- 2 days Consults called:  None Admission status:  obs tele   Author: Pati Bonine, MD 10/07/2023 5:05 AM  For on call review [email or web address].

## 2023-10-07 NOTE — Discharge Instructions (Addendum)
 Your bleeding appears to be stable.  You should get your hemoglobin rechecked in 2 days.  Return to the emergency department for any concern of increased blood loss.  You will also need an endometrial biopsy.  This is done by the OB/GYN doctors.  Call the telephone number below to set up an office visit with them in 1 week.  Hold off on taking your Eliquis until you follow-up with OB/GYN.

## 2023-10-07 NOTE — Progress Notes (Signed)
 Tried calling to receive report. After being put on hold, transferred to a number that went to voicemail.

## 2023-10-07 NOTE — Progress Notes (Signed)
   10/07/23 1532  TOC Brief Assessment  Insurance and Status Reviewed  Patient has primary care physician Yes  Home environment has been reviewed Patient lives at [place]  Prior level of function: Needs assistance short-term memoray deficits  Readmission risk has been reviewed Yes  Transition of care needs transition of care needs identified, TOC will continue to follow   Patient in OBS.  Vaginal bleeding. Patient lives at [facility]. TOC will continue to monitor patient advancement through interdisciplinary progression rounds.

## 2023-10-08 ENCOUNTER — Other Ambulatory Visit (HOSPITAL_COMMUNITY): Payer: Self-pay | Admitting: *Deleted

## 2023-10-08 ENCOUNTER — Encounter (HOSPITAL_COMMUNITY): Payer: Self-pay | Admitting: Internal Medicine

## 2023-10-08 ENCOUNTER — Inpatient Hospital Stay (HOSPITAL_COMMUNITY)

## 2023-10-08 DIAGNOSIS — D62 Acute posthemorrhagic anemia: Secondary | ICD-10-CM

## 2023-10-08 DIAGNOSIS — E785 Hyperlipidemia, unspecified: Secondary | ICD-10-CM | POA: Diagnosis present

## 2023-10-08 DIAGNOSIS — N950 Postmenopausal bleeding: Secondary | ICD-10-CM

## 2023-10-08 DIAGNOSIS — Z86718 Personal history of other venous thrombosis and embolism: Secondary | ICD-10-CM | POA: Diagnosis not present

## 2023-10-08 DIAGNOSIS — C541 Malignant neoplasm of endometrium: Secondary | ICD-10-CM | POA: Diagnosis present

## 2023-10-08 DIAGNOSIS — N939 Abnormal uterine and vaginal bleeding, unspecified: Secondary | ICD-10-CM | POA: Diagnosis present

## 2023-10-08 DIAGNOSIS — I10 Essential (primary) hypertension: Secondary | ICD-10-CM | POA: Diagnosis not present

## 2023-10-08 DIAGNOSIS — F0394 Unspecified dementia, unspecified severity, with anxiety: Secondary | ICD-10-CM | POA: Diagnosis present

## 2023-10-08 DIAGNOSIS — Z8249 Family history of ischemic heart disease and other diseases of the circulatory system: Secondary | ICD-10-CM | POA: Diagnosis not present

## 2023-10-08 DIAGNOSIS — Z515 Encounter for palliative care: Secondary | ICD-10-CM

## 2023-10-08 DIAGNOSIS — I6389 Other cerebral infarction: Secondary | ICD-10-CM

## 2023-10-08 DIAGNOSIS — K625 Hemorrhage of anus and rectum: Secondary | ICD-10-CM | POA: Diagnosis present

## 2023-10-08 DIAGNOSIS — Z7901 Long term (current) use of anticoagulants: Secondary | ICD-10-CM | POA: Diagnosis not present

## 2023-10-08 DIAGNOSIS — G8194 Hemiplegia, unspecified affecting left nondominant side: Secondary | ICD-10-CM | POA: Diagnosis present

## 2023-10-08 DIAGNOSIS — I824Y9 Acute embolism and thrombosis of unspecified deep veins of unspecified proximal lower extremity: Secondary | ICD-10-CM | POA: Diagnosis not present

## 2023-10-08 DIAGNOSIS — I82431 Acute embolism and thrombosis of right popliteal vein: Secondary | ICD-10-CM | POA: Diagnosis present

## 2023-10-08 DIAGNOSIS — I82411 Acute embolism and thrombosis of right femoral vein: Secondary | ICD-10-CM | POA: Diagnosis present

## 2023-10-08 DIAGNOSIS — I110 Hypertensive heart disease with heart failure: Secondary | ICD-10-CM | POA: Diagnosis present

## 2023-10-08 DIAGNOSIS — F0393 Unspecified dementia, unspecified severity, with mood disturbance: Secondary | ICD-10-CM | POA: Diagnosis present

## 2023-10-08 DIAGNOSIS — D75839 Thrombocytosis, unspecified: Secondary | ICD-10-CM | POA: Diagnosis present

## 2023-10-08 DIAGNOSIS — F329 Major depressive disorder, single episode, unspecified: Secondary | ICD-10-CM | POA: Diagnosis present

## 2023-10-08 DIAGNOSIS — Z66 Do not resuscitate: Secondary | ICD-10-CM | POA: Diagnosis not present

## 2023-10-08 DIAGNOSIS — I639 Cerebral infarction, unspecified: Secondary | ICD-10-CM | POA: Diagnosis not present

## 2023-10-08 DIAGNOSIS — M810 Age-related osteoporosis without current pathological fracture: Secondary | ICD-10-CM | POA: Diagnosis present

## 2023-10-08 DIAGNOSIS — I5032 Chronic diastolic (congestive) heart failure: Secondary | ICD-10-CM | POA: Diagnosis present

## 2023-10-08 DIAGNOSIS — I63412 Cerebral infarction due to embolism of left middle cerebral artery: Secondary | ICD-10-CM | POA: Diagnosis present

## 2023-10-08 DIAGNOSIS — D6832 Hemorrhagic disorder due to extrinsic circulating anticoagulants: Secondary | ICD-10-CM | POA: Diagnosis present

## 2023-10-08 DIAGNOSIS — I350 Nonrheumatic aortic (valve) stenosis: Secondary | ICD-10-CM | POA: Diagnosis present

## 2023-10-08 DIAGNOSIS — K760 Fatty (change of) liver, not elsewhere classified: Secondary | ICD-10-CM | POA: Diagnosis present

## 2023-10-08 DIAGNOSIS — Z7189 Other specified counseling: Secondary | ICD-10-CM

## 2023-10-08 DIAGNOSIS — Z833 Family history of diabetes mellitus: Secondary | ICD-10-CM | POA: Diagnosis not present

## 2023-10-08 DIAGNOSIS — F0390 Unspecified dementia without behavioral disturbance: Secondary | ICD-10-CM | POA: Diagnosis not present

## 2023-10-08 LAB — ECHOCARDIOGRAM COMPLETE
AR max vel: 1.39 cm2
AV Area VTI: 1.37 cm2
AV Area mean vel: 1.57 cm2
AV Mean grad: 16 mmHg
AV Peak grad: 30.3 mmHg
Ao pk vel: 2.75 m/s
Area-P 1/2: 1.93 cm2
Est EF: >75
Height: 65 in
MV VTI: 2.09 cm2
S' Lateral: 2.4 cm
Weight: 2811.31 [oz_av]

## 2023-10-08 LAB — CBC
HCT: 18.1 % — ABNORMAL LOW (ref 36.0–46.0)
HCT: 26.4 % — ABNORMAL LOW (ref 36.0–46.0)
Hemoglobin: 5.7 g/dL — CL (ref 12.0–15.0)
Hemoglobin: 8.6 g/dL — ABNORMAL LOW (ref 12.0–15.0)
MCH: 31.1 pg (ref 26.0–34.0)
MCH: 30.4 pg (ref 26.0–34.0)
MCHC: 31.5 g/dL (ref 30.0–36.0)
MCHC: 32.6 g/dL (ref 30.0–36.0)
MCV: 98.9 fL (ref 80.0–100.0)
MCV: 93.3 fL (ref 80.0–100.0)
Platelets: 141 10*3/uL — ABNORMAL LOW (ref 150–400)
Platelets: 141 10*3/uL — ABNORMAL LOW (ref 150–400)
RBC: 1.83 MIL/uL — ABNORMAL LOW (ref 3.87–5.11)
RBC: 2.83 MIL/uL — ABNORMAL LOW (ref 3.87–5.11)
RDW: 15.3 % (ref 11.5–15.5)
RDW: 17.5 % — ABNORMAL HIGH (ref 11.5–15.5)
WBC: 9.1 10*3/uL (ref 4.0–10.5)
WBC: 9.5 10*3/uL (ref 4.0–10.5)
nRBC: 0.2 % (ref 0.0–0.2)
nRBC: 0.2 % (ref 0.0–0.2)

## 2023-10-08 LAB — PREPARE RBC (CROSSMATCH)

## 2023-10-08 LAB — LIPID PANEL
Cholesterol: 143 mg/dL (ref 0–200)
HDL: 26 mg/dL — ABNORMAL LOW (ref >40)
LDL Cholesterol: 84 mg/dL (ref 0–99)
Total CHOL/HDL Ratio: 5.5 ratio
Triglycerides: 164 mg/dL — ABNORMAL HIGH (ref <150)
VLDL: 33 mg/dL (ref 0–40)

## 2023-10-08 LAB — HEMOGLOBIN A1C
Hgb A1c MFr Bld: 5.7 % — ABNORMAL HIGH (ref 4.8–5.6)
Mean Plasma Glucose: 116.89 mg/dL

## 2023-10-08 LAB — ABO/RH: ABO/RH(D): A POS

## 2023-10-08 LAB — MRSA NEXT GEN BY PCR, NASAL: MRSA by PCR Next Gen: NOT DETECTED

## 2023-10-08 MED ORDER — STROKE: EARLY STAGES OF RECOVERY BOOK: Freq: Once | Status: AC

## 2023-10-08 MED ORDER — SODIUM CHLORIDE 0.9% IV SOLUTION: Freq: Once | INTRAVENOUS | Status: DC

## 2023-10-08 MED ORDER — LACTATED RINGERS IV SOLN
INTRAVENOUS | Status: DC
Start: 2023-10-08 — End: 2023-10-09

## 2023-10-08 MED ORDER — PANTOPRAZOLE SODIUM 40 MG IV SOLR
40.0000 mg | INTRAVENOUS | Status: DC
  Administered 2023-10-08: 40 mg via INTRAVENOUS
  Filled 2023-10-08 (×2): qty 10

## 2023-10-08 MED ORDER — CHLORHEXIDINE GLUCONATE CLOTH 2 % EX PADS
6.0000 | MEDICATED_PAD | Freq: Every day | CUTANEOUS | Status: DC
  Administered 2023-10-08 – 2023-10-09 (×2): 6 via TOPICAL

## 2023-10-08 NOTE — Progress Notes (Signed)
 Pt being tx to [facility]. All personal clothing, blanket, pack of briefs, watch, two yellow metal rings, one without stone. Informed son of the transfer to [facility] and about the no stone in the metal ring. Per son, it has been that way.

## 2023-10-08 NOTE — Progress Notes (Signed)
 PROGRESS NOTE  Tammy Terry  WUJ:[id number] DOB: 1941-02-13 DOA: 10/06/2023 PCP: Benita Stabile, MD   Chief Complaint  Patient presents with   Vaginal Bleeding   Rectal Bleeding   Level of care: Stepdown  Brief Admission History:  83 y.o. adult with medical history significant for DVT, aortic stenosis, hypertension, diastolic CHF.  Patient presented to the ED with complaints of vaginal bleeding over the past week, that has been increasing.  Patient has cognitive deficits, and is unable to give details of how much blood she saw.  But she is able to answer simple questions, she denies dizziness, no difficulty breathing, no chest pain.    ED Course: blood pressure 130s to 150s.  Heart rate 64-72.  Hemoglobin 8.9.  Transabdominal pelvic ultrasound shows septate uterus, both limbs of endometrium are thickened for postmenopausal state.    Gynecology was consulted, recommendations: tranexamic acid x 1 dose given -monitor for further drop in hgb -if stable, office follow-up for EMB -Recommended MD/APP provide the patient with a referral to the [facility] for [facility] for follow up in 1 week.   Initial plan was to repeat blood counts discharged to nursing home, but on repeat hemoglobin dropped further to 7.5 hence hospitalization requested.   Assessment and Plan:  Postmenopausal vaginal bleeding - Severe Acute blood loss Anemia  - pt continues to have large blood clots and vaginal bleeding despite treatments - s/p 1 dose of tranexamic acid on 4/13 and repeat dose on 4/14  - hg down to 5.7 today, ordered 2 units PRBC  - I called and requested assistance from Gyn on call Dr. Despina Hidden for consultation, treatment is very  limited due to likely acute CVA, not able to give IV premarin due to concern for acute CVA - further recs to follow after Dr. Despina Hidden evaluates  - given this is mostly likely an endometrial malignancy in addition to acute CVA with ongoing severe hemorrhage requiring  transfusions and inability to anticoagulate I have asked for a palliative medicine consultation for goals of care as her prognosis is extremely limited given no good options to manage hemorrhage and she would be a poor candidate and could not tolerate aggressive treatments for malignancy. I updated son at bedside and he would like [place] to give him as much information as he can and he will discuss with his aunt (pt's sister) regarding goals of care.    Concern for Acute CVA - CT scan done on 4/13 concerning for acute CVA - MRI / MRA brain pending to be done this morning - not a candidate for anticoagulation due to ongoing severe vaginal bleeding   History of DVT  - s/p reversal of apixaban given in ED - hold apixaban   Aortic stenosis - stable   Chronic HFpEF - stable and compensated - 2023 LVEF>75% with grade 1 DD  Dementia  - pt has significant cognitive deficits and resides in memory care - delirium precautions advised   DVT prophylaxis: SCDs Code Status: Full  Family Communication:  Disposition: return to memory care when medically stabilized    Consultants:  OB/Gyn  Palliative care   Procedures:  Vaginal [place]  Antimicrobials:    Subjective: Pt more responsive today but not back to baseline per son who is at bedside.  She is moving her extremities spontaneously.  She is mostly nonverbal.  Vaginal bleeding persistent.    Objective: Vitals:   10/08/23 0614 10/08/23 0730 10/08/23 0842 10/08/23 0900  BP: Marland Kitchen)  146/63 (!) 110/51 (!) 147/58 (!) 115/56  Pulse: 89 85 84 85  Resp: 16 18 20 15   Temp: 98.5 F (36.9 C) 97.9 F (36.6 C) (!) 97.5 F (36.4 C)   TempSrc:  Axillary Axillary   SpO2: 99% 100% 99% 99%  Weight:      Height:        Intake/Output Summary (Last 24 hours) at 10/08/2023 0956 Last data filed at 10/08/2023 0700 Gross per 24 hour  Intake 601.84 ml  Output --  Net 601.84 ml   Filed Weights   10/07/23 1540 10/07/23 1541 10/08/23 0320  Weight: 79.4 kg 79.3  kg 79.7 kg   Examination:  General exam: Appears calm and comfortable, NAD. Diminished cognition. Pt more responsive today.   Respiratory system: Clear to auscultation. Respiratory effort normal. Cardiovascular system: normal S1 & S2 heard. No JVD, murmurs, rubs, gallops or clicks. No pedal edema. Gastrointestinal system: Abdomen is nondistended, soft and nontender. No organomegaly or masses felt. Normal bowel sounds heard. Central nervous system: more alert today, moving extremities spontaneously, not following commands.  Extremities: Symmetric 5 x 5 power. Skin: No rashes, lesions or ulcers. Psychiatry: Judgement and insight severely diminished. Mood & affect flat.   Data Reviewed: I have personally reviewed following labs and imaging studies  CBC: Recent Labs  Lab 10/06/23 2053 10/07/23 0302 10/07/23 1022 10/07/23 1549 10/08/23 0114  WBC 5.5 6.5 7.4 10.4 9.1  NEUTROABS 3.5  --   --   --   --   HGB 8.9* 7.5* 7.8* 7.2* 5.7*  HCT 29.1* 24.2* 25.4* 23.7* 18.1*  MCV 100.3* 100.4* 100.8* 102.2* 98.9  PLT 145* 138* 139* 155 141*    Basic Metabolic Panel: Recent Labs  Lab 10/06/23 2053  NA 141  K 4.2  CL 104  CO2 28  GLUCOSE 108*  BUN 23  CREATININE 0.81  CALCIUM 9.3    CBG: No results for input(s): "GLUCAP" in the last 168 hours.  Recent Results (from the past 240 hours)  MRSA Next Gen by PCR, Nasal     Status: None   Collection Time: 10/08/23  3:00 AM   Specimen: Nasal Mucosa; Nasal Swab  Result Value Ref Range Status   MRSA by PCR Next Gen NOT DETECTED NOT DETECTED Final    Comment: (NOTE) The GeneXpert MRSA Assay (FDA approved for NASAL specimens only), is one component of a comprehensive MRSA colonization surveillance program. It is not intended to diagnose MRSA infection nor to guide or monitor treatment for MRSA infections. Test performance is not FDA approved in patients less than 24 years old. Performed at [facility], [address], [place],  [place] [address]      Radiology Studies: CT HEAD WO CONTRAST ( ) Addendum Date: 10/07/2023 ADDENDUM REPORT: 10/07/2023 22:10 ADDENDUM: These results were called by telephone at the time of interpretation on 10/07/2023 at 9:10 pm to provider Dr. Toya Smothers, who verbally acknowledged these results. Electronically Signed   By: Darliss Cheney M.D.   On: 10/07/2023 22:10   Result Date: 10/07/2023 CLINICAL DATA:  Mental status change. EXAM: CT HEAD WITHOUT CONTRAST TECHNIQUE: Contiguous axial images were obtained from the base of the skull through the vertex without intravenous contrast. RADIATION DOSE REDUCTION: This exam was performed according to the departmental dose-optimization program which includes automated exposure control, adjustment of the mA and/or kV according to patient size and/or use of iterative reconstruction technique. COMPARISON:  MRI brain 06/13/2016. FINDINGS: Brain: There is hypodensity, loss of gray-white matter distinction  and mild sulcal effacement in the left temporoparietal region worrisome for acute infarct. There is no significant midline shift. No acute intracranial hemorrhage or extra-axial fluid collection. Moderate diffuse atrophy with compensatory dilatation of ventricular system appears unchanged from 2017. Vascular: Atherosclerotic calcifications are present within the cavernous internal carotid arteries. Skull: Normal. Negative for fracture or focal lesion. Sinuses/Orbits: There are air-fluid levels in the bilateral frontal sinuses. There is complete opacification of the right maxillary sinus and mucosal thickening of ethmoid air cells and sphenoid sinuses. Orbits are within normal limits. Other: None. IMPRESSION: 1. Hypodensity, loss of gray-white matter distinction and mild sulcal effacement in the left temporoparietal region worrisome for acute infarct. No acute intracranial hemorrhage. 2. Moderate diffuse atrophy. 3. Air-fluid levels in the bilateral frontal sinuses. Correlate for  acute sinusitis. Electronically Signed: By: Darliss Cheney M.D. On: 10/07/2023 20:20   US PELVIS (TRANSABDOMINAL ONLY) Result Date: 10/06/2023 CLINICAL DATA:  Vaginal bleeding EXAM: TRANSABDOMINAL ULTRASOUND OF PELVIS TECHNIQUE: Transabdominal ultrasound examination of the pelvis was performed including evaluation of the uterus, ovaries, adnexal regions, and pelvic cul-de-sac. COMPARISON:  05/24/2015 FINDINGS: Uterus Measurements: 11.2 x 5.0 x 5.9 cm = volume: 172 mL. No fibroids or other mass visualized. Endometrium Thickness: Question septate uterus. There appears to be 2 limbs of the endometrium measuring 1.4 cm and 1.2 cm, thickened. No focal abnormality visualized. Right ovary Measurements: 1.5 x 1.1 x 1.1 cm = volume: 0.9 mL. Normal appearance/no adnexal mass. Left ovary Measurements: 1.5 x 1.0 x 2.0 cm = volume: 1.5 mL. Normal appearance/no adnexal mass. Other findings:  Trace free fluid in the cul-de-sac. IMPRESSION: There appears to be septate uterus. Both limbs of the endometrium are thickened for postmenopausal state. In the setting of post-menopausal bleeding, endometrial sampling is indicated to exclude carcinoma. If results are benign, sonohysterogram should be considered for focal lesion work-up. (Ref: Radiological Reasoning: Algorithmic Workup of Abnormal Vaginal Bleeding with Endovaginal Sonography and Sonohysterography. AJR 2008; 161:W96-04) Electronically Signed   By: Charlett Nose M.D.   On: 10/06/2023 22:08    Scheduled Meds:  [START ON 10/09/2023]  stroke: early stages of recovery book   Does not apply Once   sodium chloride   Intravenous Once   sodium chloride   Intravenous Once   Chlorhexidine Gluconate Cloth  6 each Topical Daily   feeding supplement  237 mL Oral BID BM   pantoprazole  40 mg Oral Daily   Continuous Infusions:  lactated ringers 40 mL/hr at 10/08/23 0917     LOS: 0 days   Critical Care Procedure Note Authorized and Performed by: Maryln Manuel MD  Total Critical  Care time:  67 mins Due to a high probability of clinically significant, life threatening deterioration, the patient required my highest level of preparedness to intervene emergently and I personally spent this critical care time directly and personally managing the patient.  This critical care time included obtaining a history; examining the patient, pulse oximetry; ordering and review of studies; arranging urgent treatment with development of a management plan; evaluation of patient's response of treatment; frequent reassessment; and discussions with other providers.  This critical care time was performed to assess and manage the high probability of imminent and life threatening deterioration that could result in multi-organ failure.  It was exclusive of separately billable procedures and treating other patients and teaching time.    Standley Dakins, MD How to contact the [facility] Attending or Consulting provider 7A - 7P or covering provider during after hours 7P -7A,  for this patient?  Check the care team in [facility] and look for a) attending/consulting [facility] provider listed and b) the [facility] team listed Log into [email or web address] to find provider on call.  Locate the [facility] provider you are looking for under [facility] and page to a number that you can be directly reached. If you still have difficulty reaching the provider, please page the [facility] (Director on Call) for the Hospitalists listed on amion for assistance.  10/08/2023, 9:56 AM

## 2023-10-08 NOTE — Progress Notes (Signed)
 Unable to complete neuro checks and NIH due to patient's decreased LOC and inability to follow commands. MD aware. Pt resting quietly at this time, shows no s/s of acute distress or discomfort. VSS.

## 2023-10-08 NOTE — TOC Initial Note (Signed)
 Transition of Care [facility]) - Initial/Assessment Note    Patient Details  Name: Tammy Terry MRN: [id number] Date of Birth: 06-04-1941  Transition of Care [facility]) CM/SW Contact:    Villa Herb, LCSWA Phone Number: 10/08/2023, 10:17 AM  Clinical Narrative:                 CSW noted per chart review that pt arrived to hospital from [facility]. CSW spoke to [place] with ALF who states they assist pt with all ADLs. Pt uses a wheelchair at baseline and is able to assist with transfers. Pt peddles feet to ambulate in wheelchair around ALF. TOC to follow.   Expected Discharge Plan: Assisted Living Barriers to Discharge: Continued Medical Work up   Patient Goals and CMS Choice Patient states their goals for this hospitalization and ongoing recovery are:: get better CMS Medicare.gov Compare Post Acute Care list provided to:: Patient Represenative (must comment) Choice offered to / list presented to : Patient      Expected Discharge Plan and Services In-house Referral: Clinical Social Work Discharge Planning Services: CM Consult Post Acute Care Choice: Resumption of Svcs/PTA Provider Living arrangements for the past 2 months: Assisted Living Facility                                      Prior Living Arrangements/Services Living arrangements for the past 2 months: Assisted Living Facility Lives with:: Facility Resident Patient language and need for interpreter reviewed:: Yes Do you feel safe going back to the place where you live?: Yes      Need for Family Participation in Patient Care: Yes (Comment) Care giver support system in place?: Yes (comment) Current home services: DME Criminal Activity/Legal Involvement Pertinent to Current Situation/Hospitalization: No - Comment as needed  Activities of Daily Living   ADL Screening (condition at time of admission) Independently performs ADLs?: No Does the patient have a NEW difficulty with  bathing/dressing/toileting/self-feeding that is expected to last >3 days?: No Does the patient have a NEW difficulty with getting in/out of bed, walking, or climbing stairs that is expected to last >3 days?: No Does the patient have a NEW difficulty with communication that is expected to last >3 days?: No Is the patient deaf or have difficulty hearing?: Yes Does the patient have difficulty seeing, even when wearing glasses/contacts?: No Does the patient have difficulty concentrating, remembering, or making decisions?: Yes  Permission Sought/Granted                  Emotional Assessment Appearance:: Appears stated age       Alcohol / Substance Use: Not Applicable Psych Involvement: No (comment)  Admission diagnosis:  Vaginal bleeding [N93.9] Abnormal uterine bleeding (AUB) [N93.9] Cannabis hyperemesis syndrome concurrent with and due to cannabis abuse (HCC) [F12.188] Patient Active Problem List   Diagnosis Date Noted   Vaginal bleeding 10/07/2023   PMB (postmenopausal bleeding) 10/04/2023   Aortic stenosis 04/13/2023   Hypertrophic cardiomegaly 04/13/2023   Chronic diastolic heart failure (HCC) 04/13/2023   Acute metabolic encephalopathy 05/21/2021   Yeast dermatitis 05/21/2021   (HFpEF) heart failure with preserved ejection fraction/chronic Diastolic CHF/ EF 40-98%, ??  Outflow obstruction 05/21/2021   Rt Leg DVT (deep venous thrombosis)/Femoral Vein and Popliteal Vein 02/02/2021   Pressure injury of skin 02/01/2021   Generalized weakness 01/31/2021   COVID-19 virus infection 01/31/2021   Acute lower UTI 01/31/2021  Age-related osteoporosis without current pathological fracture    Polyosteoarthritis, unspecified    Acquired absence of unspecified breast and nipple    Anxiety disorder, unspecified    Major depressive disorder, single episode, unspecified    Essential (primary) hypertension    Fatty (change of) liver, not elsewhere classified    Insomnia, unspecified     Localized edema    Malignant neoplasm of unspecified site of unspecified female breast (HCC)    Other abnormalities of gait and mobility    Radiculopathy, site unspecified    Syncope and collapse    Tremor, unspecified    Unspecified convulsions (HCC)    Unspecified fall, initial encounter    Cellulitis and abscess of left leg 04/01/2018   Cellulitis of left leg 04/01/2018   Osteoporosis    Abdominal distention 02/03/2015   Osteopenia 03/18/2013   Essential hypertension, benign 10/22/2012   Fatty liver 10/22/2012   Elevated liver enzymes 10/22/2012   Breast cancer, stage 3 (HCC) 03/01/2012   Depression with anxiety 03/01/2012   PCP:  Omie Bickers, MD Pharmacy:   [facility] - [place], [place] - [address] [place] [address] Phone: [phone] Fax: [phone]     Social Drivers of Health (SDOH) Social History: SDOH Screenings   Food Insecurity: Patient Unable To Answer (10/07/2023)  Housing: Patient Unable To Answer (10/07/2023)  Transportation Needs: Patient Unable To Answer (10/07/2023)  Utilities: Patient Unable To Answer (10/07/2023)  Recent Concern: Utilities - At Risk (10/04/2023)  Alcohol Screen: Low Risk  (10/04/2023)  Depression (PHQ2-9): Low Risk  (10/04/2023)  Financial Resource Strain: Low Risk  (10/04/2023)  Physical Activity: Inactive (10/04/2023)  Social Connections: Patient Unable To Answer (10/07/2023)  Recent Concern: Social Connections - Socially Isolated (10/04/2023)  Stress: No Stress Concern Present (10/04/2023)  Tobacco Use: Low Risk  (10/08/2023)   SDOH Interventions:     Readmission Risk Interventions     No data to display

## 2023-10-08 NOTE — Progress Notes (Signed)
 Received call from Radiology on CT scan showing possible stroke. Per discussion w/ RN staff and pts son Andy Bannister, pt started w/ hypersomnolence, worsening confusion, and speaking "jibberish" in the evening on 4/12. CT was ordered by daytime rounding team w/ report coming back in the evening of 4/13. Pts mental status has not changed since first noted on 4/12. TeleNeuro called w/ no recommended changes.   MRI/MRA ordered for the AM to evaluate stroke.   Andy Bannister, pts son, was notified of condition and additional workup needed.   Of note pt also noted to pass additional blood overnight and repeat STAT CBC showed a drop in Hgb to 5.2. 2 units PRBC ordered.     Otilia Bloch, MD [facility].

## 2023-10-08 NOTE — Progress Notes (Signed)
*  PRELIMINARY RESULTS* Echocardiogram 2D Echocardiogram has been performed.  Tammy Terry 10/08/2023, 1:36 PM

## 2023-10-08 NOTE — Progress Notes (Signed)
 SLP Cancellation Note  Patient Details Name: Tammy Terry MRN: [id number] DOB: Feb 27, 1941   Cancelled treatment:       Reason Eval/Treat Not Completed: Fatigue/lethargy limiting ability to participate;Patient at procedure or test/unavailable (Pt off floor for MRI. RN reports unable to give [facility] due to poor alertness. SLP will check back for needs tomorrow.)  Thank you,  Claudetta Cuba, CCC-SLP [phone]  Arlissa Monteverde 10/08/2023, 3:29 PM

## 2023-10-08 NOTE — Consult Note (Signed)
 Consultation Note Date: 10/08/2023   Patient Name: Tammy Terry  DOB: 20-Apr-1941  MRN: [id number]  Age / Sex: 83 y.o., adult  PCP: Tammy Bickers, MD Referring Physician: Rayfield Cairo, MD  Reason for Consultation: Establishing goals of care  HPI/Patient Profile: 83 y.o. adult  with past medical history of left breast cancer history with mastectomy 1994, memory loss, [facility], DVT, aortic stenosis, diastolic heart failure, HTN/HLD, anxiety/depression, osteoporosis, admitted on 10/06/2023 with postmenopausal vaginal bleed, acute CVA.   Clinical Assessment and Goals of Care: Face-to-face conference with bedside nursing staff related to patient condition, needs, goals of care.  At this point we continue to await MRI brain testing.  I have reviewed medical records including EPIC notes, labs and imaging, received report from RN, assessed the patient.  Tammy Terry is lying quietly in bed.  She appears acutely/chronically ill and frail, obese.  She does not attempt to interact with me in any meaningful way.  She clearly cannot make her basic needs known.  There is no family at bedside at this time.  I returned later in the morning to find that her son is not present although he has been present throughout most of the morning.  Secure chat with hospitalist attending, gynecology, bedside nursing staff related to patient condition, plan of care.  I returned later in the afternoon to find son, Tammy Terry, present at bedside.  We meet at the bedside to discuss diagnosis prognosis, GOC, EOL wishes, disposition and options.  I introduced Palliative Medicine as specialized medical care for people living with serious illness. It focuses on providing relief from the symptoms and stress of a serious illness. The goal is to improve quality of life for both the patient and the family.  We discussed a brief life review of  the patient.  Tammy Terry was married to her husband for 51 years.  He died at [facility] 12-23-2022.  She has 1 child, son Tammy Terry.  She has been at [facility] for several years, she was there prior to her husband's passing.  Tammy Terry states that she overall has a positive outlook.  He tells me that he visits her several times per week.  He is currently living in their former home.    Tammy Terry states that Tammy Terry sister, Tammy Terry, is healthcare power of attorney.  Paperwork was completed approximately 2021 naming her spouse first and her sister Tammy Terry second.  Tammy Terry shares that unfortunately Tammy Terry now also has memory loss.  He tells me that [facility] daughter, Tammy Terry ([phone]), is a DURABLE POWER OF ATTORNEY and, "over dad's estate".  We then focused on their current illness.  We talk about acute stroke, CT results awaiting MRI.  We talked about vaginal bleed and suspected cancer.  We talked about coagulopathy and the treatment plan.  We talked about what we can and cannot change, what treatments Tammy Terry could tolerate.  We talked about HCPOA and CODE STATUS.  Tammy Terry brings up a document that seems to inform that  Tammy Terry would want to allow natural passing.  The natural disease trajectory and expectations at EOL were discussed.  I attempted to elicit values and goals of care important to the patient.  Overall, Tammy Terry states that his mother has a pleasant wife with minimal complaints.  He shared that prior to her husband's passing she always stated that she did not want her husband to die before her as she did not want to be alone.  He shares that she is not alone now that she is in [place].  The difference between aggressive medical intervention and comfort care was considered in light of the patient's goals of care.   Call to sister/HCPOA, Tammy Terry, at number listed in chart.  No answer, generic voicemail message left. Call to niece, Tammy Terry at [phone].  We talk at  length about [name]'s acute health concerns.  Tammy Terry states that she has spoken with her cousin Tammy Terry, and she is able to accurately restate the acute health concerns and treatment plan.  We talk in detail about stroke and likely gynecological cancer.  We talked about the difficulties in treating the stroke with gynecological bleeding.  Advanced directives, concepts specific to code status, artifical feeding and hydration, and rehospitalization were considered and discussed.  We talked about the concept of "treat the treatable, but allow a natural passing"/DNR.  At this point family states that they would like to have a discussion about these choices and shared that they will have a family meeting tonight.  The plan is for PMT to meet with son Tammy Terry at bedside 12:30 on 4/15 for family meeting for time we will reach out via phone to [place] and [place].  Discussed the importance of continued conversation with family and the medical providers regarding overall plan of care and treatment options, ensuring decisions are within the context of the patient's values and GOCs.  Questions and concerns were addressed.  Hard Choices booklet left for review. The family was encouraged to call with questions or concerns.  PMT will continue to support holistically.  Conference with attending, gynecology, bedside nursing staff, transition of care team related to patient condition, needs, goals of care, disposition.    HCPOA  HCPOA -sister, Tammy Terry who unfortunately is experiencing memory loss.  [name] completed paperwork approximately 2021 naming first her husband then her sister.  Her sisters daughter, Tammy Terry is known to be DURABLE POWER OF ATTORNEY.  Both Tammy Terry and Tammy Terry share that they will be making choices as a family team.    SUMMARY OF RECOMMENDATIONS   At this point continue full scope/full code Awaiting MRI results Time for outcomes PMT to have family meeting 4/15 at 12:30 at  bedside with son Tammy Terry present Dispensing optician.   Code Status/Advance Care Planning: Full code  Symptom Management:  Per hospitalist, no additional needs at this time.  Palliative Prophylaxis:  Frequent Pain Assessment, Oral Care, and Turn Reposition  Additional Recommendations (Limitations, Scope, Preferences): Full Scope Treatment  Psycho-social/Spiritual:  Desire for further Chaplaincy support:no Additional Recommendations: Caregiving  Support/Resources and Education on Hospice  Prognosis:  Unable to determine, based on outcomes.  Guarded at this point.  2 weeks or less would not be surprising  Discharge Planning: To Be Determined      Primary Diagnoses: Present on Admission:  Vaginal bleeding  Chronic diastolic heart failure (HCC)  Depression with anxiety  Essential (primary) hypertension  Major depressive disorder, single episode, unspecified  Rt Leg DVT (deep venous thrombosis)/Femoral Vein  and Popliteal Vein   I have reviewed the medical record, interviewed the patient and family, and examined the patient. The following aspects are pertinent.  Past Medical History:  Diagnosis Date   Acquired absence of unspecified breast and nipple    Age-related osteoporosis without current pathological fracture    Age-related osteoporosis without current pathological fracture    Anxiety    Anxiety disorder, unspecified    Cancer (HCC) 1994   BREAST CANCER, TREATED WITH RADIATION AND CHEMOTHERAPY   Depression with anxiety 03/01/2012   Essential (primary) hypertension    Fatty (change of) liver, not elsewhere classified    Fatty liver    H/O vitamin D deficiency    Hyperlipidemia 03/01/2012   Hypertension    Insomnia, unspecified    Localized edema    Lumbago    Major depressive disorder, single episode, unspecified    Malignant neoplasm of unspecified site of unspecified female breast (HCC)    Osteoporosis    Other abnormalities of gait and mobility     Polyosteoarthritis, unspecified    Radiculopathy, site unspecified    Rosacea    Syncope and collapse    Tremor, unspecified    Unspecified convulsions (HCC)    Unspecified fall, initial encounter    Social History   Socioeconomic History   Marital status: Widowed    Spouse name: Not on file   Number of children: 1   Years of education: college   Highest education level: Not on file  Occupational History   Occupation: Retired  Tobacco Use   Smoking status: Never   Smokeless tobacco: Never  Vaping Use   Vaping status: Never Used  Substance and Sexual Activity   Alcohol use: No    Alcohol/week: 0.0 standard drinks of alcohol   Drug use: No   Sexual activity: Not Currently    Birth control/protection: Post-menopausal  Other Topics Concern   Not on file  Social History Narrative   Denies caffeine use    Social Drivers of Corporate investment banker Strain: Low Risk  (10/04/2023)   Overall Financial Resource Strain (CARDIA)    Difficulty of Paying Living Expenses: Not hard at all  Food Insecurity: Patient Unable To Answer (10/07/2023)   Hunger Vital Sign    Worried About Running Out of Food in the Last Year: Patient unable to answer    Ran Out of Food in the Last Year: Patient unable to answer  Transportation Needs: Patient Unable To Answer (10/07/2023)   PRAPARE - Transportation    Lack of Transportation (Medical): Patient unable to answer    Lack of Transportation (Non-Medical): Patient unable to answer  Physical Activity: Inactive (10/04/2023)   Exercise Vital Sign    Days of Exercise per Week: 0 days    Minutes of Exercise per Session: 10 min  Stress: No Stress Concern Present (10/04/2023)   [employer] of Occupational Health - Occupational Stress Questionnaire    Feeling of Stress : Not at all  Social Connections: Patient Unable To Answer (10/07/2023)   Social Connection and Isolation Panel [NHANES]    Frequency of Communication with Friends and Family: Patient  unable to answer    Frequency of Social Gatherings with Friends and Family: Patient unable to answer    Attends Religious Services: Patient unable to answer    Active Member of Clubs or Organizations: Patient unable to answer    Attends Banker Meetings: Patient unable to answer    Marital Status: Patient unable  to answer  Recent Concern: Social Connections - Socially Isolated (10/04/2023)   Social Connection and Isolation Panel [NHANES]    Frequency of Communication with Friends and Family: More than three times a week    Frequency of Social Gatherings with Friends and Family: More than three times a week    Attends Religious Services: Never    Database administrator or Organizations: No    Attends Banker Meetings: Never    Marital Status: Widowed   Family History  Problem Relation Age of Onset   Diabetes Paternal Grandmother    Heart attack Father    Scheduled Meds:  [START ON 10/09/2023]  stroke: early stages of recovery book   Does not apply Once   sodium chloride   Intravenous Once   sodium chloride   Intravenous Once   Chlorhexidine Gluconate Cloth  6 each Topical Daily   feeding supplement  237 mL Oral BID BM   pantoprazole  40 mg Oral Daily   Continuous Infusions:  lactated ringers     PRN Meds:.acetaminophen **OR** acetaminophen, ondansetron **OR** ondansetron (ZOFRAN) IV, polyethylene glycol Medications Prior to Admission:  Prior to Admission medications   Medication Sig Start Date End Date Taking? Authorizing Provider  acetaminophen (TYLENOL) 325 MG tablet Take 650 mg by mouth every 6 (six) hours as needed (pain).   Yes [provider]  apixaban (ELIQUIS) 5 MG TABS tablet Take 1 tablet (5 mg total) by mouth 2 (two) times daily. 03/03/21  Yes Emokpae, Courage, MD  ascorbic acid (VITAMIN C) 500 MG tablet Take 1 tablet (500 mg total) by mouth daily. 02/04/21  Yes Emokpae, Courage, MD  guaiFENesin (MUCINEX) 600 MG 12 hr tablet Take 600 mg by  mouth 2 (two) times daily as needed (congestion).   Yes [provider]  metoprolol succinate (TOPROL-XL) 50 MG 24 hr tablet Take 1 tablet (50 mg total) by mouth daily. Take with or immediately following a meal. 05/24/21  Yes Johnson, Clanford L, MD  Multiple Vitamin (MULTIVITAMIN) tablet Take 1 tablet by mouth daily.   Yes [provider]  nitrofurantoin, macrocrystal-monohydrate, (MACROBID) 100 MG capsule Take 100 mg by mouth 2 (two) times daily. 5 day course for "infection.   Yes [provider]  nystatin (MYCOSTATIN/NYSTOP) powder Apply 1 Application topically every 12 (twelve) hours as needed (redness).   Yes [provider]  ondansetron (ZOFRAN) 4 MG tablet Take 4 mg by mouth every 8 (eight) hours as needed for nausea or vomiting.   Yes [provider]  pantoprazole (PROTONIX) 40 MG tablet Take 40 mg by mouth daily.   Yes [provider]  sertraline (ZOLOFT) 25 MG tablet Take 25 mg by mouth daily. 03/29/23  Yes [provider]  zinc sulfate 220 (50 Zn) MG capsule Take 1 capsule (220 mg total) by mouth daily. 02/04/21  Yes Shon Hale, MD   Allergies  Allergen Reactions   Amoxil [Amoxicillin] Other (See Comments)    Unknown reaction   Codeine     Unknown reaction   Penicillins Other (See Comments)    Unknown reaction - confirmed no anaphylaxis, though.   Review of Systems  Unable to perform ROS: Dementia    Physical Exam Vitals and nursing note reviewed.  Constitutional:      General: He is not in acute distress.    Appearance: He is ill-appearing.  HENT:     Mouth/Throat:     Mouth: Mucous membranes are dry.  Cardiovascular:  Rate and Rhythm: Normal rate.  Pulmonary:     Effort: Pulmonary effort is normal. No respiratory distress.  Neurological:     Comments: Does not respond in any meaningful way to voice or touch     Vital Signs: BP (!) 147/58   Pulse 84   Temp (!) 97.5 F (36.4 C) (Axillary)    Resp 20   Ht 5\' 5"  (1.651 m)   Wt 79.7 kg   SpO2 99%   BMI 29.24 kg/m  Pain Scale: PAINAD   Pain Score: Asleep   SpO2: SpO2: 99 % O2 Device:SpO2: 99 % O2 Flow Rate: .O2 Flow Rate (L/min): 3 L/min  IO: Intake/output summary:  Intake/Output Summary (Last 24 hours) at 10/08/2023 1610 Last data filed at 10/08/2023 0700 Gross per 24 hour  Intake 601.84 ml  Output --  Net 601.84 ml    LBM: Last BM Date : 10/07/23 Baseline Weight: Weight: 79.4 kg Most recent weight: Weight: 79.7 kg     Palliative Assessment/Data:     Time In: 1215  Time Out: 1330 Time Total: 75 minutes  Greater than 50%  of this time was spent counseling and coordinating care related to the above assessment and plan.  Signed by: Annabelle Barrack, NP   Please contact Palliative Medicine Team phone at [phone] for questions and concerns.  For individual provider: See Tilford Foley

## 2023-10-08 NOTE — Plan of Care (Signed)
  Problem: Education: Goal: Knowledge of General Education information will improve Description: Including pain rating scale, medication(s)/side effects and non-pharmacologic comfort measures Outcome: Progressing   Problem: Health Behavior/Discharge Planning: Goal: Ability to manage health-related needs will improve Outcome: Progressing   Problem: Clinical Measurements: Goal: Ability to maintain clinical measurements within normal limits will improve Outcome: Progressing Goal: Will remain free from infection Outcome: Progressing Goal: Respiratory complications will improve Outcome: Progressing Goal: Cardiovascular complication will be avoided Outcome: Progressing   Problem: Coping: Goal: Level of anxiety will decrease Outcome: Progressing   Problem: Elimination: Goal: Will not experience complications related to bowel motility Outcome: Progressing Goal: Will not experience complications related to urinary retention Outcome: Progressing   Problem: Pain Managment: Goal: General experience of comfort will improve and/or be controlled Outcome: Progressing   Problem: Safety: Goal: Ability to remain free from injury will improve Outcome: Progressing   Problem: Skin Integrity: Goal: Risk for impaired skin integrity will decrease Outcome: Progressing   Problem: Clinical Measurements: Goal: Diagnostic test results will improve Outcome: Not Progressing   Problem: Activity: Goal: Risk for activity intolerance will decrease Outcome: Not Progressing   Problem: Nutrition: Goal: Adequate nutrition will be maintained Outcome: Not Progressing

## 2023-10-08 NOTE — Progress Notes (Signed)
 MEWS Progress Note  Patient Details Name: Tammy Terry MRN: [id number] DOB: 1940-07-22 Today's Date: 10/08/2023   MEWS Flowsheet Documentation:  Assess: MEWS Score Temp: (!) 100.8 F (38.2 C) BP: (!) 115/49 MAP (mmHg): 67 Pulse Rate: 97 ECG Heart Rate: 96 Resp: 20 Level of Consciousness: Responds to Pain SpO2: 94 % O2 Device: Room Air Patient Activity (if Appropriate): In bed Assess: MEWS Score MEWS Temp: 1 MEWS Systolic: 0 MEWS Pulse: 0 MEWS RR: 0 MEWS LOC: 2 MEWS Score: 3 MEWS Score Color: Yellow Assess: SIRS CRITERIA SIRS Temperature : 0 SIRS Respirations : 0 SIRS Pulse: 1 SIRS WBC: 0 SIRS Score Sum : 1 SIRS Temperature : 0 SIRS Pulse: 1 SIRS Respirations : 0 SIRS WBC: 0 SIRS Score Sum : 1 Assess: if the MEWS score is Yellow or Red Were vital signs accurate and taken at a resting state?: Yes Does the patient meet 2 or more of the SIRS criteria?: No MEWS guidelines implemented : Yes, yellow Treat MEWS Interventions: Considered administering scheduled or prn medications/treatments as ordered Take Vital Signs Increase Vital Sign Frequency : Yellow: Q2hr x1, continue Q4hrs until patient remains green for 12hrs Escalate MEWS: Escalate: Yellow: Discuss with charge nurse and consider notifying provider and/or RRT        [employer] 10/08/2023, 12:14 AM

## 2023-10-09 ENCOUNTER — Other Ambulatory Visit

## 2023-10-09 DIAGNOSIS — I639 Cerebral infarction, unspecified: Secondary | ICD-10-CM | POA: Diagnosis present

## 2023-10-09 DIAGNOSIS — N939 Abnormal uterine and vaginal bleeding, unspecified: Secondary | ICD-10-CM | POA: Diagnosis not present

## 2023-10-09 DIAGNOSIS — I350 Nonrheumatic aortic (valve) stenosis: Secondary | ICD-10-CM | POA: Diagnosis not present

## 2023-10-09 DIAGNOSIS — Z7189 Other specified counseling: Secondary | ICD-10-CM | POA: Diagnosis not present

## 2023-10-09 DIAGNOSIS — Z515 Encounter for palliative care: Secondary | ICD-10-CM | POA: Diagnosis not present

## 2023-10-09 DIAGNOSIS — I5032 Chronic diastolic (congestive) heart failure: Secondary | ICD-10-CM | POA: Diagnosis not present

## 2023-10-09 DIAGNOSIS — I10 Essential (primary) hypertension: Secondary | ICD-10-CM | POA: Diagnosis not present

## 2023-10-09 LAB — CBC
HCT: 22.8 % — ABNORMAL LOW (ref 36.0–46.0)
Hemoglobin: 7 g/dL — ABNORMAL LOW (ref 12.0–15.0)
MCH: 29.9 pg (ref 26.0–34.0)
MCHC: 30.7 g/dL (ref 30.0–36.0)
MCV: 97.4 fL (ref 80.0–100.0)
Platelets: 109 10*3/uL — ABNORMAL LOW (ref 150–400)
RBC: 2.34 MIL/uL — ABNORMAL LOW (ref 3.87–5.11)
RDW: 17.7 % — ABNORMAL HIGH (ref 11.5–15.5)
WBC: 6.5 10*3/uL (ref 4.0–10.5)
nRBC: 0 % (ref 0.0–0.2)

## 2023-10-09 LAB — BPAM RBC
Blood Product Expiration Date: 202505052359
Blood Product Expiration Date: 202505062359
ISSUE DATE / TIME: 202504140315
ISSUE DATE / TIME: 202504140554
Unit Type and Rh: 6200
Unit Type and Rh: 6200

## 2023-10-09 LAB — TYPE AND SCREEN
ABO/RH(D): A POS
Antibody Screen: NEGATIVE
Unit division: 0
Unit division: 0

## 2023-10-09 LAB — BASIC METABOLIC PANEL WITH GFR
Anion gap: 6 (ref 5–15)
BUN: 27 mg/dL — ABNORMAL HIGH (ref 8–23)
CO2: 27 mmol/L (ref 22–32)
Calcium: 8.2 mg/dL — ABNORMAL LOW (ref 8.9–10.3)
Chloride: 107 mmol/L (ref 98–111)
Creatinine, Ser: 0.68 mg/dL (ref 0.44–1.00)
GFR, Estimated: >60 mL/min (ref >60)
Glucose, Bld: 99 mg/dL (ref 70–99)
Potassium: 4 mmol/L (ref 3.5–5.1)
Sodium: 140 mmol/L (ref 135–145)

## 2023-10-09 LAB — MAGNESIUM: Magnesium: 2 mg/dL (ref 1.7–2.4)

## 2023-10-09 MED ORDER — LORAZEPAM 2 MG/ML IJ SOLN
1.0000 mg | INTRAMUSCULAR | Status: DC | PRN
  Administered 2023-10-11 – 2023-10-12 (×2): 1 mg via INTRAVENOUS
  Filled 2023-10-09 (×2): qty 1

## 2023-10-09 MED ORDER — GLYCOPYRROLATE 1 MG PO TABS: 1.0000 mg | ORAL_TABLET | ORAL | Status: DC | PRN

## 2023-10-09 MED ORDER — POLYVINYL ALCOHOL 1.4 % OP SOLN: 1.0000 [drp] | Freq: Four times a day (QID) | OPHTHALMIC | Status: DC | PRN

## 2023-10-09 MED ORDER — MORPHINE SULFATE (PF) 2 MG/ML IV SOLN
1.0000 mg | INTRAVENOUS | Status: DC | PRN
  Administered 2023-10-10 – 2023-10-12 (×10): 2 mg via INTRAVENOUS
  Filled 2023-10-09 (×11): qty 1

## 2023-10-09 MED ORDER — MORPHINE 100MG IN NS 100ML (1MG/ML) PREMIX INFUSION: 1.0000 mg/h | INTRAVENOUS | Status: DC

## 2023-10-09 MED ORDER — GLYCOPYRROLATE 0.2 MG/ML IJ SOLN: 0.2000 mg | INTRAMUSCULAR | Status: DC | PRN

## 2023-10-09 MED ORDER — ONDANSETRON 4 MG PO TBDP
4.0000 mg | ORAL_TABLET | Freq: Four times a day (QID) | ORAL | Status: DC | PRN
Start: 2023-10-09 — End: 2023-10-12

## 2023-10-09 MED ORDER — LORAZEPAM 1 MG PO TABS: 1.0000 mg | ORAL_TABLET | ORAL | Status: DC | PRN

## 2023-10-09 MED ORDER — ACETAMINOPHEN 650 MG RE SUPP: 650.0000 mg | Freq: Four times a day (QID) | RECTAL | Status: DC | PRN

## 2023-10-09 MED ORDER — GLYCOPYRROLATE 0.2 MG/ML IJ SOLN
0.2000 mg | INTRAMUSCULAR | Status: DC | PRN
  Filled 2023-10-09: qty 1

## 2023-10-09 MED ORDER — ONDANSETRON HCL 4 MG/2ML IJ SOLN: 4.0000 mg | Freq: Four times a day (QID) | INTRAMUSCULAR | Status: DC | PRN

## 2023-10-09 MED ORDER — BIOTENE DRY MOUTH MT LIQD: 15.0000 mL | OROMUCOSAL | Status: DC | PRN

## 2023-10-09 MED ORDER — ACETAMINOPHEN 325 MG PO TABS: 650.0000 mg | ORAL_TABLET | Freq: Four times a day (QID) | ORAL | Status: DC | PRN

## 2023-10-09 MED ORDER — LORAZEPAM 2 MG/ML PO CONC: 1.0000 mg | ORAL | Status: DC | PRN

## 2023-10-09 NOTE — Plan of Care (Signed)

## 2023-10-09 NOTE — Progress Notes (Addendum)
 PROGRESS NOTE  Tammy Terry  YNW:[id number] DOB: [birth year] DOA: 10/06/2023 PCP: Omie Bickers, MD   Chief Complaint  Patient presents with   Vaginal Bleeding   Rectal Bleeding   Level of care: Stepdown  Brief Admission History:  83 y.o. adult with medical history significant for DVT on apixaban, aortic stenosis, hypertension, diastolic CHF.  Patient presented to the ED with complaints of vaginal bleeding over the past week, that has been increasing.  Patient has cognitive deficits, and is unable to give details of how much blood she saw.  But she is able to answer simple questions, she denies dizziness, no difficulty breathing, no chest pain.    Apixaban was reversed in the ED Gynecology was consulted, recommendations: tranexamic acid given She continued to bleed, Hg dropped to 5.7. PRBC transfusion given (2 units) AMS worsened and CT and MRI demonstrated acute large right MCA CVA Pt continued to have heavy vaginal bleeding.  Dr. Randolm Butte consulted, felt she likely has endometrial carcinoma with limited treatment options to stop vaginal bleeding.  Palliative medicine consulted and patient was transitioned to full comfort measures on 10/09/23.     Assessment and Plan:  Postmenopausal vaginal bleeding - Severe Acute blood loss Anemia  - pt continues to have large blood clots and vaginal bleeding despite treatments - s/p 1 dose of tranexamic acid on 4/13 and repeated dose on 4/13  - hg down to 5.7 ordered 2 units PRBC 4/14 - I called and requested assistance from Gyn on call Dr. Randolm Butte for consultation, treatment is very  limited due to acute CVA, not able to give IV premarin due to hypercoagulable state with acute CVA - given this is mostly likely an endometrial malignancy in addition to acute CVA with ongoing severe hemorrhage requiring transfusions and inability to anticoagulate I have asked for a palliative medicine consultation for goals of care as her prognosis is extremely limited given no  good options to manage hemorrhage and she would be a poor candidate and could not tolerate aggressive treatments for malignancy. I updated son at bedside and after conference with palliative medicine with patient's sister on telephone, transition made to full comfort measures  Large Acute left MCA CVA - CT scan done on 4/13 concerning for acute CVA - MRI / MRA brain confirms large acute left MCA CVA  - not a candidate for anticoagulation due to ongoing severe vaginal bleeding  - palliative medicine consultation meeting with family today with transition to full comfort measures  History of DVT  - s/p reversal of apixaban given in ED - discontinued apixaban   Aortic stenosis - stable   Chronic HFpEF - stable and compensated - 2023 LVEF>75% with grade 1 DD  Dementia  - pt has significant cognitive deficits and resides in memory care - delirium precautions advised   DVT prophylaxis: SCDs--->comfort measures  Code Status: DNR Comfort  Family Communication: daily inperson updates with son Disposition: anticipating hospital death   Consultants:  OB/Gyn - Dr Randolm Butte Palliative care   Procedures:  Vaginal US   Antimicrobials:    Subjective: Pt is less responsive today.   She is unable to speak, swallow, make basic needs known.  She continues to have moderate to severe vaginal bleeding.   Objective: Vitals:   10/09/23 1100 10/09/23 1145 10/09/23 1200 10/09/23 1300  BP: (!) 141/39  (!) 123/45 (!) 138/50  Pulse: 82  79 91  Resp: 19  15 (!) 22  Temp:  (!) 97.3 F (36.3 C)  TempSrc:  Axillary    SpO2: 99%  100%   Weight:      Height:        Intake/Output Summary (Last 24 hours) at 10/09/2023 1552 Last data filed at 10/09/2023 1136 Gross per 24 hour  Intake 903.09 ml  Output --  Net 903.09 ml   Filed Weights   10/07/23 1540 10/07/23 1541 10/08/23 0320  Weight: 79.4 kg 79.3 kg 79.7 kg   Examination:  General exam: Appears calm and comfortable,appears terminally ill.    Respiratory system: shallow breathing. Cardiovascular system: normal S1 & S2 heard. No JVD, murmurs, rubs, gallops or clicks. No pedal edema. Gastrointestinal system: Abdomen is nondistended, soft and nontender. No organomegaly or masses felt. Normal bowel sounds heard. Central nervous system: dense left hemiparesis. Extremities: dependent edema. Skin: age related changes.  Psychiatry: terminally ill.    Data Reviewed: I have personally reviewed following labs and imaging studies  CBC: Recent Labs  Lab 10/06/23 2053 10/07/23 0302 10/07/23 1022 10/07/23 1549 10/08/23 0114 10/08/23 0946 10/09/23 0423  WBC 5.5   < > 7.4 10.4 9.1 9.5 6.5  NEUTROABS 3.5  --   --   --   --   --   --   HGB 8.9*   < > 7.8* 7.2* 5.7* 8.6* 7.0*  HCT 29.1*   < > 25.4* 23.7* 18.1* 26.4* 22.8*  MCV 100.3*   < > 100.8* 102.2* 98.9 93.3 97.4  PLT 145*   < > 139* 155 141* 141* 109*   < > = values in this interval not displayed.    Basic Metabolic Panel: Recent Labs  Lab 10/06/23 2053 10/09/23 0423  NA 141 140  K 4.2 4.0  CL 104 107  CO2 28 27  GLUCOSE 108* 99  BUN 23 27*  CREATININE 0.81 0.68  CALCIUM 9.3 8.2*  MG  --  2.0    CBG: No results for input(s): "GLUCAP" in the last 168 hours.  Recent Results (from the past 240 hours)  MRSA Next Gen by PCR, Nasal     Status: None   Collection Time: 10/08/23  3:00 AM   Specimen: Nasal Mucosa; Nasal Swab  Result Value Ref Range Status   MRSA by PCR Next Gen NOT DETECTED NOT DETECTED Final    Comment: (NOTE) The GeneXpert MRSA Assay (FDA approved for NASAL specimens only), is one component of a comprehensive MRSA colonization surveillance program. It is not intended to diagnose MRSA infection nor to guide or monitor treatment for MRSA infections. Test performance is not FDA approved in patients less than 23 years old. Performed at [facility], [address], [place], [place] [address]      Radiology Studies: MR BRAIN WO CONTRAST Result  Date: 10/09/2023 CLINICAL DATA:  Stroke/TIA, determine embolic source; Neuro deficit, acute, stroke suspected. EXAM: MRI HEAD WITHOUT CONTRAST MRA HEAD WITHOUT CONTRAST TECHNIQUE: Multiplanar, multi-echo pulse sequences of the brain and surrounding structures were acquired without intravenous contrast. Angiographic images of the Circle of Willis were acquired using MRA technique without intravenous contrast. COMPARISON:  Head CT 10/07/2023 and MRI 06/13/2016 FINDINGS: MRI HEAD FINDINGS Multiple sequences are severely motion degraded. Brain: As seen on CT, there is a large acute infarct in the inferior/posterior left MCA territory including extensive involvement of the temporal lobe as well as parietal and lateral occipital lobe involvement in addition to involvement of the insula. There is well-defined cytotoxic edema with regional sulcal effacement but no midline shift or other significant mass effect.  There are also 1 or 2 punctate acute infarcts in the left frontal lobe. Scattered chronic microhemorrhages are present in the cerebellum, brainstem, and cerebral hemispheres including in the deep gray nuclei suggestive of chronic hypertension. There is a background of mild to moderate chronic small vessel ischemia in the cerebral white matter. There is advanced cerebral atrophy. No extra-axial fluid collection is evident. Vascular: See below. Skull and upper cervical spine: Unremarkable bone marrow signal. Sinuses/Orbits: Sinusitis, including complete right maxillary sinus opacification. No gross orbital abnormality. No significant mastoid fluid. Other: None. MRA HEAD FINDINGS The study is severely motion degraded. Anterior circulation: The petrous portions of the internal carotid arteries were incompletely imaged. Both ICAs are patent from the included distal petrous segments through the termini with motion artifact limiting assessment of the cavernous segments, particularly in the regions of the anterior genu  bilaterally. There is no evidence of a high-grade ICA stenosis elsewhere. The left M1 segment is patent proximally. There is severe motion artifact through the distal left M1 segment with an underlying severe stenosis or segmental occlusion not excluded. There is return of flow related enhancement within the proximal left M2 segments, however motion artifact severely limits branch vessel assessment and this study is inadequate for assessment of an M2 branch occlusion. The right MCA and both ACAs are patent proximally with severe motion artifact limiting assessment, particularly of the branch vessels. No large aneurysm is identified. Posterior circulation: The intracranial vertebral arteries were not imaged. The basilar artery is widely patent. Posterior communicating arteries are diminutive or absent. Both PCAs are widely patent proximally. There is the appearance severe mid and distal left P2 and bilateral P3 stenoses, although this may be in part artifactual due to motion. No aneurysm is identified. Anatomic variants: None. IMPRESSION: 1. Large acute left MCA infarct. 2. Severely motion degraded head MRA. Motion artifact through the distal left M1 segment with an underlying severe stenosis or segmental occlusion not excluded. The study is inadequate to assess for a possible M2 occlusion. 3. Possible severe stenoses versus artifact in the left P2 and bilateral P3 segments. 4. Mild to moderate chronic small vessel ischemia and advanced cerebral atrophy. Electronically Signed   By: Aundra Lee M.D.   On: 10/09/2023 13:50   MR ANGIO HEAD WO CONTRAST Result Date: 10/09/2023 CLINICAL DATA:  Stroke/TIA, determine embolic source; Neuro deficit, acute, stroke suspected. EXAM: MRI HEAD WITHOUT CONTRAST MRA HEAD WITHOUT CONTRAST TECHNIQUE: Multiplanar, multi-echo pulse sequences of the brain and surrounding structures were acquired without intravenous contrast. Angiographic images of the Circle of Willis were acquired  using MRA technique without intravenous contrast. COMPARISON:  Head CT 10/07/2023 and MRI 06/13/2016 FINDINGS: MRI HEAD FINDINGS Multiple sequences are severely motion degraded. Brain: As seen on CT, there is a large acute infarct in the inferior/posterior left MCA territory including extensive involvement of the temporal lobe as well as parietal and lateral occipital lobe involvement in addition to involvement of the insula. There is well-defined cytotoxic edema with regional sulcal effacement but no midline shift or other significant mass effect. There are also 1 or 2 punctate acute infarcts in the left frontal lobe. Scattered chronic microhemorrhages are present in the cerebellum, brainstem, and cerebral hemispheres including in the deep gray nuclei suggestive of chronic hypertension. There is a background of mild to moderate chronic small vessel ischemia in the cerebral white matter. There is advanced cerebral atrophy. No extra-axial fluid collection is evident. Vascular: See below. Skull and upper cervical spine: Unremarkable bone marrow signal.  Sinuses/Orbits: Sinusitis, including complete right maxillary sinus opacification. No gross orbital abnormality. No significant mastoid fluid. Other: None. MRA HEAD FINDINGS The study is severely motion degraded. Anterior circulation: The petrous portions of the internal carotid arteries were incompletely imaged. Both ICAs are patent from the included distal petrous segments through the termini with motion artifact limiting assessment of the cavernous segments, particularly in the regions of the anterior genu bilaterally. There is no evidence of a high-grade ICA stenosis elsewhere. The left M1 segment is patent proximally. There is severe motion artifact through the distal left M1 segment with an underlying severe stenosis or segmental occlusion not excluded. There is return of flow related enhancement within the proximal left M2 segments, however motion artifact  severely limits branch vessel assessment and this study is inadequate for assessment of an M2 branch occlusion. The right MCA and both ACAs are patent proximally with severe motion artifact limiting assessment, particularly of the branch vessels. No large aneurysm is identified. Posterior circulation: The intracranial vertebral arteries were not imaged. The basilar artery is widely patent. Posterior communicating arteries are diminutive or absent. Both PCAs are widely patent proximally. There is the appearance severe mid and distal left P2 and bilateral P3 stenoses, although this may be in part artifactual due to motion. No aneurysm is identified. Anatomic variants: None. IMPRESSION: 1. Large acute left MCA infarct. 2. Severely motion degraded head MRA. Motion artifact through the distal left M1 segment with an underlying severe stenosis or segmental occlusion not excluded. The study is inadequate to assess for a possible M2 occlusion. 3. Possible severe stenoses versus artifact in the left P2 and bilateral P3 segments. 4. Mild to moderate chronic small vessel ischemia and advanced cerebral atrophy. Electronically Signed   By: Aundra Lee M.D.   On: 10/09/2023 13:50   ECHOCARDIOGRAM COMPLETE Result Date: 10/08/2023    ECHOCARDIOGRAM REPORT   Patient Name:   Tammy Terry Date of Exam: 10/08/2023 Medical Rec #:  [id number]      Height:       65.0 in Accession #:    [id number]     Weight:       175.7 lb Date of Birth:  02-07-1941      BSA:          1.872 m Patient Age:    82 years       BP:           103/46 mmHg Patient Gender: F              HR:           80 bpm. Exam Location:  [facility] Procedure: 2D Echo, Cardiac Doppler and Color Doppler (Both Spectral and Color            Flow Doppler were utilized during procedure). Indications:    Stroke l63.9  History:        Patient has prior history of Echocardiogram examinations, most                 recent 09/30/2021. Risk Factors:Dyslipidemia and Hypertension.                  Cancer (HCC) (From Hx), (HFpEF) heart failure with preserved                 ejection fraction/chronic Diastolic CHF.  Sonographer:    Denese Finn RCS Referring Phys: [phone] [place]  Sonographer Comments: Technically challenging study due to limited acoustic windows. IMPRESSIONS  1. Left ventricular ejection fraction, by estimation, is >75%. The left ventricle has hyperdynamic function. Small intracavitary gradient of 7 mm Hg and increased flow accerelation in the LVOT likely from hyperdynamic LV and severe LVH The left ventricle has no regional wall motion abnormalities. There is severe asymmetric left ventricular hypertrophy of the septal and basal segments. Left ventricular diastolic parameters are consistent with Grade I diastolic dysfunction (impaired relaxation). Elevated left ventricular end-diastolic pressure.  2. Right ventricular systolic function is normal. The right ventricular size is normal. Tricuspid regurgitation signal is inadequate for assessing PA pressure.  3. The mitral valve is normal in structure. No evidence of mitral valve regurgitation. No evidence of mitral stenosis. The mean mitral valve gradient is 3.0 mmHg.  4. The aortic valve was not well visualized. There is moderate calcification of the aortic valve. Aortic valve regurgitation is not visualized. Mild to moderate aortic valve stenosis. Aortic valve area, by VTI measures 1.37 cm. Aortic valve mean gradient measures 16.0 mmHg. Aortic valve Vmax measures 2.75 m/s.  5. The inferior vena cava is normal in size with greater than 50% respiratory variability, suggesting right atrial pressure of 3 mmHg.  6. Increased flow velocities may be secondary to anemia, thyrotoxicosis, hyperdynamic or high flow state. FINDINGS  Left Ventricle: Left ventricular ejection fraction, by estimation, is >75%. The left ventricle has hyperdynamic function. The left ventricle has no regional wall motion abnormalities. Strain was performed and  the global longitudinal strain is indeterminate. The left ventricular internal cavity size was small. There is severe asymmetric left ventricular hypertrophy of the septal and basal segments. Left ventricular diastolic parameters are consistent with Grade I diastolic dysfunction (impaired relaxation). Elevated left ventricular end-diastolic pressure. Right Ventricle: The right ventricular size is normal. No increase in right ventricular wall thickness. Right ventricular systolic function is normal. Tricuspid regurgitation signal is inadequate for assessing PA pressure. Left Atrium: Left atrial size was normal in size. Right Atrium: Right atrial size was normal in size. Pericardium: There is no evidence of pericardial effusion. Mitral Valve: The mitral valve is normal in structure. No evidence of mitral valve regurgitation. No evidence of mitral valve stenosis. MV peak gradient, 10.2 mmHg. The mean mitral valve gradient is 3.0 mmHg. Tricuspid Valve: The tricuspid valve is normal in structure. Tricuspid valve regurgitation is trivial. No evidence of tricuspid stenosis. Aortic Valve: The aortic valve was not well visualized. There is moderate calcification of the aortic valve. Aortic valve regurgitation is not visualized. Mild to moderate aortic stenosis is present. Aortic valve mean gradient measures 16.0 mmHg. Aortic valve peak gradient measures 30.2 mmHg. Aortic valve area, by VTI measures 1.37 cm. Pulmonic Valve: The pulmonic valve was not well visualized. Pulmonic valve regurgitation is not visualized. No evidence of pulmonic stenosis. Aorta: The aortic root is normal in size and structure. Venous: The inferior vena cava is normal in size with greater than 50% respiratory variability, suggesting right atrial pressure of 3 mmHg. IAS/Shunts: The atrial septum is grossly normal. Additional Comments: 3D was performed not requiring image post processing on an independent workstation and was indeterminate.  LEFT  VENTRICLE PLAX 2D LVIDd:         3.80 cm   Diastology LVIDs:         2.40 cm   LV e' medial:    4.79 cm/s LV PW:         1.10 cm   LV E/e' medial:  29.2 LV IVS:        1.70  cm   LV e' lateral:   4.90 cm/s LVOT diam:     1.70 cm   LV E/e' lateral: 28.6 LV SV:         85 LV SV Index:   45 LVOT Area:     2.27 cm  RIGHT VENTRICLE RV S prime:     12.50 cm/s TAPSE (M-mode): 1.6 cm LEFT ATRIUM             Index        RIGHT ATRIUM           Index LA diam:        3.40 cm 1.82 cm/m   RA Area:     10.50 cm LA Vol (A2C):   63.2 ml 33.76 ml/m  RA Volume:   21.10 ml  11.27 ml/m LA Vol (A4C):   56.0 ml 29.91 ml/m LA Biplane Vol: 59.4 ml 31.73 ml/m  AORTIC VALVE AV Area (Vmax):    1.39 cm AV Area (Vmean):   1.57 cm AV Area (VTI):     1.37 cm AV Vmax:           275.00 cm/s AV Vmean:          184.000 cm/s AV VTI:            0.618 m AV Peak Grad:      30.2 mmHg AV Mean Grad:      16.0 mmHg LVOT Vmax:         168.00 cm/s LVOT Vmean:        127.000 cm/s LVOT VTI:          0.373 m LVOT/AV VTI ratio: 0.60  AORTA Ao Root diam: 3.10 cm MITRAL VALVE MV Area (PHT): 1.93 cm     SHUNTS MV Area VTI:   2.09 cm     Systemic VTI:  0.37 m MV Peak grad:  10.2 mmHg    Systemic Diam: 1.70 cm MV Mean grad:  3.0 mmHg MV Vmax:       1.60 m/s MV Vmean:      80.2 cm/s MV Decel Time: 394 msec MV E velocity: 140.00 cm/s MV A velocity: 190.00 cm/s MV E/A ratio:  0.74 Vishnu Priya Mallipeddi Electronically signed by Lucetta Russel Mallipeddi Signature Date/Time: 10/08/2023/4:17:30 PM    Final    CT HEAD WO CONTRAST ( ) Addendum Date: 10/07/2023 ADDENDUM REPORT: 10/07/2023 22:10 ADDENDUM: These results were called by telephone at the time of interpretation on 10/07/2023 at 9:10 pm to provider Dr. Corinna Dickens, who verbally acknowledged these results. Electronically Signed   By: Tyron Gallon M.D.   On: 10/07/2023 22:10   Result Date: 10/07/2023 CLINICAL DATA:  Mental status change. EXAM: CT HEAD WITHOUT CONTRAST TECHNIQUE: Contiguous axial images were  obtained from the base of the skull through the vertex without intravenous contrast. RADIATION DOSE REDUCTION: This exam was performed according to the departmental dose-optimization program which includes automated exposure control, adjustment of the mA and/or kV according to patient size and/or use of iterative reconstruction technique. COMPARISON:  MRI brain 06/13/2016. FINDINGS: Brain: There is hypodensity, loss of gray-white matter distinction and mild sulcal effacement in the left temporoparietal region worrisome for acute infarct. There is no significant midline shift. No acute intracranial hemorrhage or extra-axial fluid collection. Moderate diffuse atrophy with compensatory dilatation of ventricular system appears unchanged from 2017. Vascular: Atherosclerotic calcifications are present within the cavernous internal carotid arteries. Skull: Normal. Negative for fracture or focal lesion. Sinuses/Orbits: There are air-fluid levels  in the bilateral frontal sinuses. There is complete opacification of the right maxillary sinus and mucosal thickening of ethmoid air cells and sphenoid sinuses. Orbits are within normal limits. Other: None. IMPRESSION: 1. Hypodensity, loss of gray-white matter distinction and mild sulcal effacement in the left temporoparietal region worrisome for acute infarct. No acute intracranial hemorrhage. 2. Moderate diffuse atrophy. 3. Air-fluid levels in the bilateral frontal sinuses. Correlate for acute sinusitis. Electronically Signed: By: Tyron Gallon M.D. On: 10/07/2023 20:20    Scheduled Meds:  sodium chloride   Intravenous Once   Continuous Infusions:  morphine       LOS: 1 day   Time spent: 58 mins  Shaylan Tutton Lincoln Renshaw, MD How to contact the [facility] Attending or Consulting provider 7A - 7P or covering provider during after hours 7P -7A, for this patient?  Check the care team in [facility] and look for a) attending/consulting [facility] provider listed and b) the [facility] team listed Log into  [email or web address] to find provider on call.  Locate the [facility] provider you are looking for under [facility] and page to a number that you can be directly reached. If you still have difficulty reaching the provider, please page the [facility] (Director on Call) for the Hospitalists listed on amion for assistance.  10/09/2023, 3:52 PM

## 2023-10-09 NOTE — Plan of Care (Signed)
  Problem: Education: Goal: Knowledge of General Education information will improve Description: Including pain rating scale, medication(s)/side effects and non-pharmacologic comfort measures Outcome: Not Progressing   Problem: Health Behavior/Discharge Planning: Goal: Ability to manage health-related needs will improve Outcome: Not Progressing   Problem: Nutrition: Goal: Adequate nutrition will be maintained Outcome: Not Progressing   

## 2023-10-09 NOTE — Progress Notes (Signed)
 SLP Cancellation Note  Patient Details Name: Tammy Terry MRN: [id number] DOB: 01-14-1941   Cancelled treatment:       Reason Eval/Treat Not Completed: Fatigue/lethargy limiting ability to participate;Other (comment) (SLP advised that Pt is now comfort care. SLP will sign off. Reconsult if the need arises.)  Thank you,  Claudetta Cuba, CCC-SLP [phone]  [name] 10/09/2023, 1:28 PM

## 2023-10-09 NOTE — Progress Notes (Signed)
 Palliative:  Tammy Terry is lying quietly in bed.  She appears acutely/chronically ill and frail.  She will briefly make but not keep eye contact.  She is unable to answer my orientation questions.  I do not believe that she can make her basic needs known.  Her son is not present at bedside at this time although he has been present for most of the morning.  I returned later in the morning to find son Tammy Terry at Tammy Terry's bedside.  We talked about her acute concerns including, but not limited to, no MRI results, decreasing hemoglobin, continued vaginal bleeding.  We continue with plan for family phone conference 12:30.  I returned to the room later in the day for scheduled phone conference.  We go to the family waiting room where time reaches out to his aunt Tammy Terry, cousin Tammy Terry, and [name]'s husband Tammy Terry.  Bedside nursing staff is also present.  We talk in detail about Tammy Terry's acute health concerns and treatment options.  I share that no MRI results.  We talked about continued decrease in hemoglobin.  We talked about acute stroke.  We talked about a few "what if's and maybe's".  I encouraged family to think about what this time looks like and feels like for Tammy Terry.  We talk in detail about comfort and dignity, let nature take its course, what this would look like and feel like.  We talked about prognosis with permission.  After discussion, family elects comfort care.  End-of-life order set implemented.  Family states that they are coming in from [place].  Plan: Comfort and dignity at end-of-life, comfort measures only.  End-of-life order set implemented Prognosis: Anticipate less than 24 hours based on acuity of condition, active vaginal bleed.  70 minutes, extended time Tammy Lab, NP Palliative medicine team Team phone [phone]

## 2023-10-10 DIAGNOSIS — F0390 Unspecified dementia without behavioral disturbance: Secondary | ICD-10-CM | POA: Diagnosis not present

## 2023-10-10 DIAGNOSIS — Z515 Encounter for palliative care: Secondary | ICD-10-CM | POA: Diagnosis not present

## 2023-10-10 DIAGNOSIS — I639 Cerebral infarction, unspecified: Secondary | ICD-10-CM | POA: Diagnosis not present

## 2023-10-10 DIAGNOSIS — N939 Abnormal uterine and vaginal bleeding, unspecified: Principal | ICD-10-CM

## 2023-10-10 NOTE — Progress Notes (Signed)
 Palliative:  Face to face discussion with bedside nursing staff related to patient condition, needs.   Mrs. Tammy Terry is lying quietly in bed.  She was made comfort care 4/15 and appears to be comfortable. She does not interact with me in any meaningful way.  Her son, Tammy Terry, is present at bedside.    We talk about Tammy Terry's condition and symptom management needs.  Support provided.   Conference with attending, bedside nursing staff and transition of care team related to patient condition, needs, goals of care.   Plan:   FULL COMFORT CARE 4/15.  End of life order set in place.  Prognosis:   Hours, anticipated in hospital death, based on large, active gynecological bleed and acute large L MCA stroke.   35 minutes  Arla Lab, NP Palliative Medicine Team  Team phone [phone]

## 2023-10-10 NOTE — Progress Notes (Signed)
 PROGRESS NOTE  Tammy ESQUIVIAS ZOX:[id number] DOB: 03-Sep-1940 DOA: 10/06/2023 PCP: Benita Stabile, MD  Brief History:  83 y.o. adult with medical history significant for DVT on apixaban, aortic stenosis, hypertension, diastolic CHF.  Patient presented to the ED with complaints of vaginal bleeding over the past week, that has been increasing.  Patient has cognitive deficits, and is unable to give details of how much blood she saw.  But she is able to answer simple questions, she denies dizziness, no difficulty breathing, no chest pain.    Apixaban was reversed in the ED Gynecology was consulted, recommendations: tranexamic acid given She continued to bleed, Hg dropped to 5.7. PRBC transfusion given (2 units) AMS worsened and CT and MRI demonstrated acute large right MCA CVA Pt continued to have heavy vaginal bleeding.  Dr. Despina Hidden consulted, felt she likely has endometrial carcinoma with limited treatment options to stop vaginal bleeding.  Palliative medicine consulted and patient was transitioned to full comfort measures on 10/09/23.     Assessment/Plan: Postmenopausal vaginal bleeding - Severe Acute blood loss Anemia  - pt continues to have large blood clots and vaginal bleeding despite treatments - s/p 1 dose of tranexamic acid on 4/13 and repeated dose on 4/13  - hg down to 5.7 ordered 2 units PRBC 4/14 - I called and requested assistance from Gyn on call Dr. Despina Hidden for consultation, treatment options are very  limited due to acute CVA, not able to give IV premarin due to hypercoagulable state with acute CVA - given this is mostly likely an endometrial malignancy in addition to acute CVA with ongoing severe hemorrhage requiring transfusions and inability to anticoagulate I have asked for a palliative medicine consultation for goals of care as her prognosis is extremely limited given no good options to manage hemorrhage and she would be a poor candidate and could not tolerate aggressive  treatments for malignancy. I updated son at bedside and after conference with palliative medicine with patient's sister on telephone, transition made to full comfort measures   Large Acute left MCA ischemic stroke - CT scan done on 4/13 concerning for acute CVA - MRI / MRA brain confirms large acute left MCA CVA  - not a candidate for anticoagulation or antiplatelets due to ongoing severe vaginal bleeding  - palliative medicine consultation meeting with family today with transition to full comfort measures -LDL 84 -4/14 A1C--5.7 -4/14 Echo--EF >75%, G1DD, normal RVF, mild-mod AS   History of DVT  - s/p reversal of apixaban given in ED - discontinued apixaban    Aortic stenosis - stable    Chronic HFpEF - stable and compensated - -4/14 Echo--EF >75%, G1DD, normal RVF, mild-mod AS   Major neurocognitive disorder  - pt has significant cognitive deficits and resides in memory care - delirium precautions advised     Family Communication:   son at bedside 4/16  Consultants:  palliative  Code Status:  FULL COMFORT  DVT Prophylaxis:  FULL COMFORT   Procedures: As Listed in Progress Note Above  Antibiotics: None    Subjective: No possible due to dementia and dysphasia  Objective: Vitals:   10/09/23 1200 10/09/23 1300 10/09/23 1946 10/10/23 1037  BP: (!) 123/45 (!) 138/50 133/73 (!) 109/55  Pulse: 79 91 (!) 106 87  Resp: 15 (!) 22 18   Temp:   98.7 F (37.1 C)   TempSrc:   Oral   SpO2: 100%  97% 98%  Weight:  Height:       No intake or output data in the 24 hours ending 10/10/23 1811 Weight change:  Exam:  General:  Pt is alert, does not follows commands appropriately, not in acute distress HEENT: No icterus, No thrush, No neck mass, [place]/AT Cardiovascular: RRR, S1/S2, no rubs, no gallops Respiratory: CTA bilaterally, no wheezing, no crackles, no rhonchi Abdomen: Soft/+BS, non tender, non distended, no guarding Extremities: No edema, No lymphangitis, No  petechiae, No rashes, no synovitis   Data Reviewed: I have personally reviewed following labs and imaging studies Basic Metabolic Panel: Recent Labs  Lab 10/06/23 2053 10/09/23 0423  NA 141 140  K 4.2 4.0  CL 104 107  CO2 28 27  GLUCOSE 108* 99  BUN 23 27*  CREATININE 0.81 0.68  CALCIUM 9.3 8.2*  MG  --  2.0   Liver Function Tests: Recent Labs  Lab 10/06/23 2053  AST 16  ALT 12  ALKPHOS 96  BILITOT 0.5  PROT 6.6  ALBUMIN 3.4*   No results for input(s): "LIPASE", "AMYLASE" in the last 168 hours. No results for input(s): "AMMONIA" in the last 168 hours. Coagulation Profile: Recent Labs  Lab 10/06/23 2053  INR 1.5*   CBC: Recent Labs  Lab 10/06/23 2053 10/07/23 0302 10/07/23 1022 10/07/23 1549 10/08/23 0114 10/08/23 0946 10/09/23 0423  WBC 5.5   < > 7.4 10.4 9.1 9.5 6.5  NEUTROABS 3.5  --   --   --   --   --   --   HGB 8.9*   < > 7.8* 7.2* 5.7* 8.6* 7.0*  HCT 29.1*   < > 25.4* 23.7* 18.1* 26.4* 22.8*  MCV 100.3*   < > 100.8* 102.2* 98.9 93.3 97.4  PLT 145*   < > 139* 155 141* 141* 109*   < > = values in this interval not displayed.   Cardiac Enzymes: No results for input(s): "CKTOTAL", "CKMB", "CKMBINDEX", "TROPONINI" in the last 168 hours. BNP: Invalid input(s): "POCBNP" CBG: No results for input(s): "GLUCAP" in the last 168 hours. HbA1C: Recent Labs    10/08/23 0946  HGBA1C 5.7*   Urine analysis:    Component Value Date/Time   COLORURINE YELLOW 05/20/2021 1834   APPEARANCEUR HAZY (A) 05/20/2021 1834   LABSPEC >1.030 (H) 05/20/2021 1834   PHURINE 5.5 05/20/2021 1834   GLUCOSEU NEGATIVE 05/20/2021 1834   HGBUR SMALL (A) 05/20/2021 1834   BILIRUBINUR NEGATIVE 05/20/2021 1834   KETONESUR NEGATIVE 05/20/2021 1834   PROTEINUR NEGATIVE 05/20/2021 1834   NITRITE NEGATIVE 05/20/2021 1834   LEUKOCYTESUR MODERATE (A) 05/20/2021 1834   Sepsis Labs: @LABRCNTIP (procalcitonin:4,lacticidven:4) ) Recent Results (from the past 240 hours)  MRSA Next  Gen by PCR, Nasal     Status: None   Collection Time: 10/08/23  3:00 AM   Specimen: Nasal Mucosa; Nasal Swab  Result Value Ref Range Status   MRSA by PCR Next Gen NOT DETECTED NOT DETECTED Final    Comment: (NOTE) The GeneXpert MRSA Assay (FDA approved for NASAL specimens only), is one component of a comprehensive MRSA colonization surveillance program. It is not intended to diagnose MRSA infection nor to guide or monitor treatment for MRSA infections. Test performance is not FDA approved in patients less than 39 years old. Performed at [facility], [address] [place], [place] [address]      Scheduled Meds: Continuous Infusions:  morphine      Procedures/Studies: MR BRAIN WO CONTRAST Result Date: 10/09/2023 CLINICAL DATA:  Stroke/TIA, determine embolic  source; Neuro deficit, acute, stroke suspected. EXAM: MRI HEAD WITHOUT CONTRAST MRA HEAD WITHOUT CONTRAST TECHNIQUE: Multiplanar, multi-echo pulse sequences of the brain and surrounding structures were acquired without intravenous contrast. Angiographic images of the Circle of Willis were acquired using MRA technique without intravenous contrast. COMPARISON:  Head CT 10/07/2023 and MRI 06/13/2016 FINDINGS: MRI HEAD FINDINGS Multiple sequences are severely motion degraded. Brain: As seen on CT, there is a large acute infarct in the inferior/posterior left MCA territory including extensive involvement of the temporal lobe as well as parietal and lateral occipital lobe involvement in addition to involvement of the insula. There is well-defined cytotoxic edema with regional sulcal effacement but no midline shift or other significant mass effect. There are also 1 or 2 punctate acute infarcts in the left frontal lobe. Scattered chronic microhemorrhages are present in the cerebellum, brainstem, and cerebral hemispheres including in the deep gray nuclei suggestive of chronic hypertension. There is a background of mild to moderate chronic small  vessel ischemia in the cerebral white matter. There is advanced cerebral atrophy. No extra-axial fluid collection is evident. Vascular: See below. Skull and upper cervical spine: Unremarkable bone marrow signal. Sinuses/Orbits: Sinusitis, including complete right maxillary sinus opacification. No gross orbital abnormality. No significant mastoid fluid. Other: None. MRA HEAD FINDINGS The study is severely motion degraded. Anterior circulation: The petrous portions of the internal carotid arteries were incompletely imaged. Both ICAs are patent from the included distal petrous segments through the termini with motion artifact limiting assessment of the cavernous segments, particularly in the regions of the anterior genu bilaterally. There is no evidence of a high-grade ICA stenosis elsewhere. The left M1 segment is patent proximally. There is severe motion artifact through the distal left M1 segment with an underlying severe stenosis or segmental occlusion not excluded. There is return of flow related enhancement within the proximal left M2 segments, however motion artifact severely limits branch vessel assessment and this study is inadequate for assessment of an M2 branch occlusion. The right MCA and both ACAs are patent proximally with severe motion artifact limiting assessment, particularly of the branch vessels. No large aneurysm is identified. Posterior circulation: The intracranial vertebral arteries were not imaged. The basilar artery is widely patent. Posterior communicating arteries are diminutive or absent. Both PCAs are widely patent proximally. There is the appearance severe mid and distal left P2 and bilateral P3 stenoses, although this may be in part artifactual due to motion. No aneurysm is identified. Anatomic variants: None. IMPRESSION: 1. Large acute left MCA infarct. 2. Severely motion degraded head MRA. Motion artifact through the distal left M1 segment with an underlying severe stenosis or  segmental occlusion not excluded. The study is inadequate to assess for a possible M2 occlusion. 3. Possible severe stenoses versus artifact in the left P2 and bilateral P3 segments. 4. Mild to moderate chronic small vessel ischemia and advanced cerebral atrophy. Electronically Signed   By: Aundra Lee M.D.   On: 10/09/2023 13:50   MR ANGIO HEAD WO CONTRAST Result Date: 10/09/2023 CLINICAL DATA:  Stroke/TIA, determine embolic source; Neuro deficit, acute, stroke suspected. EXAM: MRI HEAD WITHOUT CONTRAST MRA HEAD WITHOUT CONTRAST TECHNIQUE: Multiplanar, multi-echo pulse sequences of the brain and surrounding structures were acquired without intravenous contrast. Angiographic images of the Circle of Willis were acquired using MRA technique without intravenous contrast. COMPARISON:  Head CT 10/07/2023 and MRI 06/13/2016 FINDINGS: MRI HEAD FINDINGS Multiple sequences are severely motion degraded. Brain: As seen on CT, there is a large acute infarct in  the inferior/posterior left MCA territory including extensive involvement of the temporal lobe as well as parietal and lateral occipital lobe involvement in addition to involvement of the insula. There is well-defined cytotoxic edema with regional sulcal effacement but no midline shift or other significant mass effect. There are also 1 or 2 punctate acute infarcts in the left frontal lobe. Scattered chronic microhemorrhages are present in the cerebellum, brainstem, and cerebral hemispheres including in the deep gray nuclei suggestive of chronic hypertension. There is a background of mild to moderate chronic small vessel ischemia in the cerebral white matter. There is advanced cerebral atrophy. No extra-axial fluid collection is evident. Vascular: See below. Skull and upper cervical spine: Unremarkable bone marrow signal. Sinuses/Orbits: Sinusitis, including complete right maxillary sinus opacification. No gross orbital abnormality. No significant mastoid fluid. Other:  None. MRA HEAD FINDINGS The study is severely motion degraded. Anterior circulation: The petrous portions of the internal carotid arteries were incompletely imaged. Both ICAs are patent from the included distal petrous segments through the termini with motion artifact limiting assessment of the cavernous segments, particularly in the regions of the anterior genu bilaterally. There is no evidence of a high-grade ICA stenosis elsewhere. The left M1 segment is patent proximally. There is severe motion artifact through the distal left M1 segment with an underlying severe stenosis or segmental occlusion not excluded. There is return of flow related enhancement within the proximal left M2 segments, however motion artifact severely limits branch vessel assessment and this study is inadequate for assessment of an M2 branch occlusion. The right MCA and both ACAs are patent proximally with severe motion artifact limiting assessment, particularly of the branch vessels. No large aneurysm is identified. Posterior circulation: The intracranial vertebral arteries were not imaged. The basilar artery is widely patent. Posterior communicating arteries are diminutive or absent. Both PCAs are widely patent proximally. There is the appearance severe mid and distal left P2 and bilateral P3 stenoses, although this may be in part artifactual due to motion. No aneurysm is identified. Anatomic variants: None. IMPRESSION: 1. Large acute left MCA infarct. 2. Severely motion degraded head MRA. Motion artifact through the distal left M1 segment with an underlying severe stenosis or segmental occlusion not excluded. The study is inadequate to assess for a possible M2 occlusion. 3. Possible severe stenoses versus artifact in the left P2 and bilateral P3 segments. 4. Mild to moderate chronic small vessel ischemia and advanced cerebral atrophy. Electronically Signed   By: Sebastian Ache M.D.   On: 10/09/2023 13:50   ECHOCARDIOGRAM COMPLETE Result  Date: 10/08/2023    ECHOCARDIOGRAM REPORT   Patient Name:   Dan Maker Date of Exam: 10/08/2023 Medical Rec #:  [id number]      Height:       65.0 in Accession #:    [id number]     Weight:       175.7 lb Date of Birth:  [birth year]      BSA:          1.872 m Patient Age:    82 years       BP:           103/46 mmHg Patient Gender: F              HR:           80 bpm. [facility] Procedure: 2D Echo, Cardiac Doppler and Color Doppler (Both Spectral and Color  Flow Doppler were utilized during procedure). Indications:    Stroke l63.9  History:        Patient has prior history of Echocardiogram examinations, most                 recent 09/30/2021. Risk Factors:Dyslipidemia and Hypertension.                 Cancer (HCC) (From Hx), (HFpEF) heart failure with preserved                 ejection fraction/chronic Diastolic CHF.  Sonographer:    Denese Finn RCS Referring Phys: [phone] [place]  Sonographer Comments: Technically challenging study due to limited acoustic windows. IMPRESSIONS  1. Left ventricular ejection fraction, by estimation, is >75%. The left ventricle has hyperdynamic function. Small intracavitary gradient of 7 mm Hg and increased flow accerelation in the LVOT likely from hyperdynamic LV and severe LVH The left ventricle has no regional wall motion abnormalities. There is severe asymmetric left ventricular hypertrophy of the septal and basal segments. Left ventricular diastolic parameters are consistent with Grade I diastolic dysfunction (impaired relaxation). Elevated left ventricular end-diastolic pressure.  2. Right ventricular systolic function is normal. The right ventricular size is normal. Tricuspid regurgitation signal is inadequate for assessing PA pressure.  3. The mitral valve is normal in structure. No evidence of mitral valve regurgitation. No evidence of mitral stenosis. The mean mitral valve gradient is 3.0 mmHg.  4. The aortic valve was not well visualized.  There is moderate calcification of the aortic valve. Aortic valve regurgitation is not visualized. Mild to moderate aortic valve stenosis. Aortic valve area, by VTI measures 1.37 cm. Aortic valve mean gradient measures 16.0 mmHg. Aortic valve Vmax measures 2.75 m/s.  5. The inferior vena cava is normal in size with greater than 50% respiratory variability, suggesting right atrial pressure of 3 mmHg.  6. Increased flow velocities may be secondary to anemia, thyrotoxicosis, hyperdynamic or high flow state. FINDINGS  Left Ventricle: Left ventricular ejection fraction, by estimation, is >75%. The left ventricle has hyperdynamic function. The left ventricle has no regional wall motion abnormalities. Strain was performed and the global longitudinal strain is indeterminate. The left ventricular internal cavity size was small. There is severe asymmetric left ventricular hypertrophy of the septal and basal segments. Left ventricular diastolic parameters are consistent with Grade I diastolic dysfunction (impaired relaxation). Elevated left ventricular end-diastolic pressure. Right Ventricle: The right ventricular size is normal. No increase in right ventricular wall thickness. Right ventricular systolic function is normal. Tricuspid regurgitation signal is inadequate for assessing PA pressure. Left Atrium: Left atrial size was normal in size. Right Atrium: Right atrial size was normal in size. Pericardium: There is no evidence of pericardial effusion. Mitral Valve: The mitral valve is normal in structure. No evidence of mitral valve regurgitation. No evidence of mitral valve stenosis. MV peak gradient, 10.2 mmHg. The mean mitral valve gradient is 3.0 mmHg. Tricuspid Valve: The tricuspid valve is normal in structure. Tricuspid valve regurgitation is trivial. No evidence of tricuspid stenosis. Aortic Valve: The aortic valve was not well visualized. There is moderate calcification of the aortic valve. Aortic valve regurgitation  is not visualized. Mild to moderate aortic stenosis is present. Aortic valve mean gradient measures 16.0 mmHg. Aortic valve peak gradient measures 30.2 mmHg. Aortic valve area, by VTI measures 1.37 cm. Pulmonic Valve: The pulmonic valve was not well visualized. Pulmonic valve regurgitation is not visualized. No evidence of pulmonic stenosis. Aorta: The  aortic root is normal in size and structure. Venous: The inferior vena cava is normal in size with greater than 50% respiratory variability, suggesting right atrial pressure of 3 mmHg. IAS/Shunts: The atrial septum is grossly normal. Additional Comments: 3D was performed not requiring image post processing on an independent workstation and was indeterminate.  LEFT VENTRICLE PLAX 2D LVIDd:         3.80 cm   Diastology LVIDs:         2.40 cm   LV e' medial:    4.79 cm/s LV PW:         1.10 cm   LV E/e' medial:  29.2 LV IVS:        1.70 cm   LV e' lateral:   4.90 cm/s LVOT diam:     1.70 cm   LV E/e' lateral: 28.6 LV SV:         85 LV SV Index:   45 LVOT Area:     2.27 cm  RIGHT VENTRICLE RV S prime:     12.50 cm/s TAPSE (M-mode): 1.6 cm LEFT ATRIUM             Index        RIGHT ATRIUM           Index LA diam:        3.40 cm 1.82 cm/m   RA Area:     10.50 cm LA Vol (A2C):   63.2 ml 33.76 ml/m  RA Volume:   21.10 ml  11.27 ml/m LA Vol (A4C):   56.0 ml 29.91 ml/m LA Biplane Vol: 59.4 ml 31.73 ml/m  AORTIC VALVE AV Area (Vmax):    1.39 cm AV Area (Vmean):   1.57 cm AV Area (VTI):     1.37 cm AV Vmax:           275.00 cm/s AV Vmean:          184.000 cm/s AV VTI:            0.618 m AV Peak Grad:      30.2 mmHg AV Mean Grad:      16.0 mmHg LVOT Vmax:         168.00 cm/s LVOT Vmean:        127.000 cm/s LVOT VTI:          0.373 m LVOT/AV VTI ratio: 0.60  AORTA Ao Root diam: 3.10 cm MITRAL VALVE MV Area (PHT): 1.93 cm     SHUNTS MV Area VTI:   2.09 cm     Systemic VTI:  0.37 m MV Peak grad:  10.2 mmHg    Systemic Diam: 1.70 cm MV Mean grad:  3.0 mmHg MV Vmax:        1.60 m/s MV Vmean:      80.2 cm/s MV Decel Time: 394 msec MV E velocity: 140.00 cm/s MV A velocity: 190.00 cm/s MV E/A ratio:  0.74 Vishnu Priya Mallipeddi Electronically signed by Lucetta Russel Mallipeddi Signature Date/Time: 10/08/2023/4:17:30 PM    Final    CT HEAD WO CONTRAST ( ) Addendum Date: 10/07/2023 ADDENDUM REPORT: 10/07/2023 22:10 ADDENDUM: These results were called by telephone at the time of interpretation on 10/07/2023 at 9:10 pm to provider Dr. Corinna Dickens, who verbally acknowledged these results. Electronically Signed   By: Tyron Gallon M.D.   On: 10/07/2023 22:10   Result Date: 10/07/2023 CLINICAL DATA:  Mental status change. EXAM: CT HEAD WITHOUT CONTRAST TECHNIQUE: Contiguous axial images were obtained from the  base of the skull through the vertex without intravenous contrast. RADIATION DOSE REDUCTION: This exam was performed according to the departmental dose-optimization program which includes automated exposure control, adjustment of the mA and/or kV according to patient size and/or use of iterative reconstruction technique. COMPARISON:  MRI brain 06/13/2016. FINDINGS: Brain: There is hypodensity, loss of gray-white matter distinction and mild sulcal effacement in the left temporoparietal region worrisome for acute infarct. There is no significant midline shift. No acute intracranial hemorrhage or extra-axial fluid collection. Moderate diffuse atrophy with compensatory dilatation of ventricular system appears unchanged from 2017. Vascular: Atherosclerotic calcifications are present within the cavernous internal carotid arteries. Skull: Normal. Negative for fracture or focal lesion. Sinuses/Orbits: There are air-fluid levels in the bilateral frontal sinuses. There is complete opacification of the right maxillary sinus and mucosal thickening of ethmoid air cells and sphenoid sinuses. Orbits are within normal limits. Other: None. IMPRESSION: 1. Hypodensity, loss of gray-white matter distinction  and mild sulcal effacement in the left temporoparietal region worrisome for acute infarct. No acute intracranial hemorrhage. 2. Moderate diffuse atrophy. 3. Air-fluid levels in the bilateral frontal sinuses. Correlate for acute sinusitis. Electronically Signed: By: Tyron Gallon M.D. On: 10/07/2023 20:20   US  PELVIS (TRANSABDOMINAL ONLY) Result Date: 10/06/2023 CLINICAL DATA:  Vaginal bleeding EXAM: TRANSABDOMINAL ULTRASOUND OF PELVIS TECHNIQUE: Transabdominal ultrasound examination of the pelvis was performed including evaluation of the uterus, ovaries, adnexal regions, and pelvic cul-de-sac. COMPARISON:  05/24/2015 FINDINGS: Uterus Measurements: 11.2 x 5.0 x 5.9 cm = volume: 172 mL. No fibroids or other mass visualized. Endometrium Thickness: Question septate uterus. There appears to be 2 limbs of the endometrium measuring 1.4 cm and 1.2 cm, thickened. No focal abnormality visualized. Right ovary Measurements: 1.5 x 1.1 x 1.1 cm = volume: 0.9 mL. Normal appearance/no adnexal mass. Left ovary Measurements: 1.5 x 1.0 x 2.0 cm = volume: 1.5 mL. Normal appearance/no adnexal mass. Other findings:  Trace free fluid in the cul-de-sac. IMPRESSION: There appears to be septate uterus. Both limbs of the endometrium are thickened for postmenopausal state. In the setting of post-menopausal bleeding, endometrial sampling is indicated to exclude carcinoma. If results are benign, sonohysterogram should be considered for focal lesion work-up. (Ref: Radiological Reasoning: Algorithmic Workup of Abnormal Vaginal Bleeding with Endovaginal Sonography and Sonohysterography. AJR 2008; 235:T73-22) Electronically Signed   By: Janeece Mechanic M.D.   On: 10/06/2023 22:08    Demaris Fillers, DO  [facility]  If 7PM-7AM, please contact night-coverage [email or web address] Password [email or web address] 10/10/2023, 6:11 PM   LOS: 2 days

## 2023-10-10 NOTE — Plan of Care (Signed)
  Problem: Education: Goal: Knowledge of General Education information will improve Description: Including pain rating scale, medication(s)/side effects and non-pharmacologic comfort measures Outcome: Not Applicable   Problem: Health Behavior/Discharge Planning: Goal: Ability to manage health-related needs will improve Outcome: Not Applicable

## 2023-10-10 NOTE — Plan of Care (Signed)
  Problem: Activity: Goal: Risk for activity intolerance will decrease Outcome: Not Progressing   Problem: Nutrition: Goal: Adequate nutrition will be maintained Outcome: Not Progressing   Problem: Education: Goal: Knowledge of disease or condition will improve Outcome: Not Progressing

## 2023-10-10 NOTE — Progress Notes (Signed)
 Pt appears to be more agitated when stimulated. She is still having heavy vaginal bleeding with large clots. Son remained at bedside overnight. This RN spoke with him at length and answered his questions regarding comfort care and patient's status.  This RN offered ativan for the agitation and he would like to hold off for now since she settles when not stimulated. She does not appear to be in pain.  Emotional support provided to the patients son. No acute events overnight. Kellogg RN

## 2023-10-11 DIAGNOSIS — I639 Cerebral infarction, unspecified: Secondary | ICD-10-CM | POA: Diagnosis not present

## 2023-10-11 DIAGNOSIS — F0390 Unspecified dementia without behavioral disturbance: Secondary | ICD-10-CM | POA: Diagnosis not present

## 2023-10-11 DIAGNOSIS — N939 Abnormal uterine and vaginal bleeding, unspecified: Secondary | ICD-10-CM | POA: Diagnosis not present

## 2023-10-11 DIAGNOSIS — I5032 Chronic diastolic (congestive) heart failure: Secondary | ICD-10-CM | POA: Diagnosis not present

## 2023-10-11 NOTE — Plan of Care (Signed)

## 2023-10-11 NOTE — Plan of Care (Signed)

## 2023-10-11 NOTE — Progress Notes (Signed)
 PROGRESS NOTE  Tammy Terry WUJ:[id number] DOB: 09/29/1940 DOA: 10/06/2023 PCP: Benita Stabile, MD  Brief History:  83 y.o. adult with medical history significant for DVT on apixaban, aortic stenosis, hypertension, diastolic CHF.  Patient presented to the ED with complaints of vaginal bleeding over the past week, that has been increasing.  Patient has cognitive deficits, and is unable to give details of how much blood she saw.  But she is able to answer simple questions, she denies dizziness, no difficulty breathing, no chest pain.    Apixaban was reversed in the ED Gynecology was consulted, recommendations: tranexamic acid given She continued to bleed, Hg dropped to 5.7. PRBC transfusion given (2 units) AMS worsened and CT and MRI demonstrated acute large right MCA CVA Pt continued to have heavy vaginal bleeding.  Dr. Despina Hidden consulted, felt she likely has endometrial carcinoma with limited treatment options to stop vaginal bleeding.  Palliative medicine consulted and patient was transitioned to full comfort measures on 10/09/23.     Assessment/Plan: Postmenopausal vaginal bleeding - Severe Acute blood loss Anemia  - pt continues to have large blood clots and vaginal bleeding despite treatments - s/p 1 dose of tranexamic acid on 4/13 and repeated dose on 4/13  - hg down to 5.7 ordered 2 units PRBC 4/14 - I called and requested assistance from Gyn on call Dr. Despina Hidden for consultation, treatment options are very  limited due to acute CVA, not able to give IV premarin due to hypercoagulable state with acute CVA - given this is mostly likely an endometrial malignancy in addition to acute CVA with ongoing severe hemorrhage requiring transfusions and inability to anticoagulate I have asked for a palliative medicine consultation for goals of care as her prognosis is extremely limited given no good options to manage hemorrhage and she would be a poor candidate and could not tolerate aggressive  treatments for malignancy. I updated son at bedside and after conference with palliative medicine with patient's sister on telephone, transition made to full comfort measures   Large Acute left MCA ischemic stroke - CT scan done on 4/13 concerning for acute CVA - MRI / MRA brain confirms large acute left MCA CVA  - not a candidate for anticoagulation or antiplatelets due to ongoing severe vaginal bleeding  - palliative medicine consultation meeting with family today with transition to full comfort measures -LDL 84 -4/14 A1C--5.7 -4/14 Echo--EF >75%, G1DD, normal RVF, mild-mod AS   History of DVT  - s/p reversal of apixaban given in ED - discontinued apixaban    Aortic stenosis - stable    Chronic HFpEF - stable and compensated - -4/14 Echo--EF >75%, G1DD, normal RVF, mild-mod AS   Major neurocognitive disorder  - pt has significant cognitive deficits and resides in memory care - delirium precautions advised   Goals of Care -4/17--reconfirmed full comfort measures with son -if patient remains stable hemodynamically, he is amendable for residential hospice--will ask them to eval for feasibility       Family Communication:   son at bedside 4/17   Consultants:  palliative   Code Status:  FULL COMFORT   DVT Prophylaxis:  FULL COMFORT     Procedures: As Listed in Progress Note Above   Antibiotics: None          Subjective: Pt is awake.  No distress noted.  Does not answer question.  Objective: Vitals:   10/09/23 1300 10/09/23 1946 10/10/23 1037 10/10/23  1918  BP: (!) 138/50 133/73 (!) 109/55 (!) 123/51  Pulse: 91 (!) 106 87 (!) 109  Resp: (!) 22 18  18   Temp:  98.7 F (37.1 C)  98.6 F (37 C)  TempSrc:  Oral  Axillary  SpO2:  97% 98% 95%  Weight:      Height:       No intake or output data in the 24 hours ending 10/11/23 1816 Weight change:  Exam:  General:  Pt is alert, does not follow commands appropriately, not in acute distress HEENT: No  icterus, No thrush, No neck mass, [place]/AT Cardiovascular: RRR, S1/S2, no rubs, no gallops Respiratory:bibasilar rales. No wheeze Abdomen: Soft/+BS, non tender, non distended, no guarding Extremities: No edema, No lymphangitis, No petechiae, No rashes, no synovitis   Data Reviewed: I have personally reviewed following labs and imaging studies Basic Metabolic Panel: Recent Labs  Lab 10/06/23 2053 10/09/23 0423  NA 141 140  K 4.2 4.0  CL 104 107  CO2 28 27  GLUCOSE 108* 99  BUN 23 27*  CREATININE 0.81 0.68  CALCIUM 9.3 8.2*  MG  --  2.0   Liver Function Tests: Recent Labs  Lab 10/06/23 2053  AST 16  ALT 12  ALKPHOS 96  BILITOT 0.5  PROT 6.6  ALBUMIN 3.4*   No results for input(s): "LIPASE", "AMYLASE" in the last 168 hours. No results for input(s): "AMMONIA" in the last 168 hours. Coagulation Profile: Recent Labs  Lab 10/06/23 2053  INR 1.5*   CBC: Recent Labs  Lab 10/06/23 2053 10/07/23 0302 10/07/23 1022 10/07/23 1549 10/08/23 0114 10/08/23 0946 10/09/23 0423  WBC 5.5   < > 7.4 10.4 9.1 9.5 6.5  NEUTROABS 3.5  --   --   --   --   --   --   HGB 8.9*   < > 7.8* 7.2* 5.7* 8.6* 7.0*  HCT 29.1*   < > 25.4* 23.7* 18.1* 26.4* 22.8*  MCV 100.3*   < > 100.8* 102.2* 98.9 93.3 97.4  PLT 145*   < > 139* 155 141* 141* 109*   < > = values in this interval not displayed.   Cardiac Enzymes: No results for input(s): "CKTOTAL", "CKMB", "CKMBINDEX", "TROPONINI" in the last 168 hours. BNP: Invalid input(s): "POCBNP" CBG: No results for input(s): "GLUCAP" in the last 168 hours. HbA1C: No results for input(s): "HGBA1C" in the last 72 hours. Urine analysis:    Component Value Date/Time   COLORURINE YELLOW 05/20/2021 1834   APPEARANCEUR HAZY (A) 05/20/2021 1834   LABSPEC >1.030 (H) 05/20/2021 1834   PHURINE 5.5 05/20/2021 1834   GLUCOSEU NEGATIVE 05/20/2021 1834   HGBUR SMALL (A) 05/20/2021 1834   BILIRUBINUR NEGATIVE 05/20/2021 1834   KETONESUR NEGATIVE 05/20/2021  1834   PROTEINUR NEGATIVE 05/20/2021 1834   NITRITE NEGATIVE 05/20/2021 1834   LEUKOCYTESUR MODERATE (A) 05/20/2021 1834   Sepsis Labs: @LABRCNTIP (procalcitonin:4,lacticidven:4) ) Recent Results (from the past 240 hours)  MRSA Next Gen by PCR, Nasal     Status: None   Collection Time: 10/08/23  3:00 AM   Specimen: Nasal Mucosa; Nasal Swab  Result Value Ref Range Status   MRSA by PCR Next Gen NOT DETECTED NOT DETECTED Final    Comment: (NOTE) The GeneXpert MRSA Assay (FDA approved for NASAL specimens only), is one component of a comprehensive MRSA colonization surveillance program. It is not intended to diagnose MRSA infection nor to guide or monitor treatment for MRSA infections. Test performance is not FDA  approved in patients less than 78 years old. Performed at [facility], [address] [place], [place] [address]      Scheduled Meds: Continuous Infusions:  morphine      Procedures/Studies: MR BRAIN WO CONTRAST Result Date: 10/09/2023 CLINICAL DATA:  Stroke/TIA, determine embolic source; Neuro deficit, acute, stroke suspected. EXAM: MRI HEAD WITHOUT CONTRAST MRA HEAD WITHOUT CONTRAST TECHNIQUE: Multiplanar, multi-echo pulse sequences of the brain and surrounding structures were acquired without intravenous contrast. Angiographic images of the Circle of Willis were acquired using MRA technique without intravenous contrast. COMPARISON:  Head CT 10/07/2023 and MRI 06/13/2016 FINDINGS: MRI HEAD FINDINGS Multiple sequences are severely motion degraded. Brain: As seen on CT, there is a large acute infarct in the inferior/posterior left MCA territory including extensive involvement of the temporal lobe as well as parietal and lateral occipital lobe involvement in addition to involvement of the insula. There is well-defined cytotoxic edema with regional sulcal effacement but no midline shift or other significant mass effect. There are also 1 or 2 punctate acute infarcts in the left  frontal lobe. Scattered chronic microhemorrhages are present in the cerebellum, brainstem, and cerebral hemispheres including in the deep gray nuclei suggestive of chronic hypertension. There is a background of mild to moderate chronic small vessel ischemia in the cerebral white matter. There is advanced cerebral atrophy. No extra-axial fluid collection is evident. Vascular: See below. Skull and upper cervical spine: Unremarkable bone marrow signal. Sinuses/Orbits: Sinusitis, including complete right maxillary sinus opacification. No gross orbital abnormality. No significant mastoid fluid. Other: None. MRA HEAD FINDINGS The study is severely motion degraded. Anterior circulation: The petrous portions of the internal carotid arteries were incompletely imaged. Both ICAs are patent from the included distal petrous segments through the termini with motion artifact limiting assessment of the cavernous segments, particularly in the regions of the anterior genu bilaterally. There is no evidence of a high-grade ICA stenosis elsewhere. The left M1 segment is patent proximally. There is severe motion artifact through the distal left M1 segment with an underlying severe stenosis or segmental occlusion not excluded. There is return of flow related enhancement within the proximal left M2 segments, however motion artifact severely limits branch vessel assessment and this study is inadequate for assessment of an M2 branch occlusion. The right MCA and both ACAs are patent proximally with severe motion artifact limiting assessment, particularly of the branch vessels. No large aneurysm is identified. Posterior circulation: The intracranial vertebral arteries were not imaged. The basilar artery is widely patent. Posterior communicating arteries are diminutive or absent. Both PCAs are widely patent proximally. There is the appearance severe mid and distal left P2 and bilateral P3 stenoses, although this may be in part artifactual due  to motion. No aneurysm is identified. Anatomic variants: None. IMPRESSION: 1. Large acute left MCA infarct. 2. Severely motion degraded head MRA. Motion artifact through the distal left M1 segment with an underlying severe stenosis or segmental occlusion not excluded. The study is inadequate to assess for a possible M2 occlusion. 3. Possible severe stenoses versus artifact in the left P2 and bilateral P3 segments. 4. Mild to moderate chronic small vessel ischemia and advanced cerebral atrophy. Electronically Signed   By: Aundra Lee M.D.   On: 10/09/2023 13:50   MR ANGIO HEAD WO CONTRAST Result Date: 10/09/2023 CLINICAL DATA:  Stroke/TIA, determine embolic source; Neuro deficit, acute, stroke suspected. EXAM: MRI HEAD WITHOUT CONTRAST MRA HEAD WITHOUT CONTRAST TECHNIQUE: Multiplanar, multi-echo pulse sequences of the brain and surrounding structures were  acquired without intravenous contrast. Angiographic images of the Circle of Willis were acquired using MRA technique without intravenous contrast. COMPARISON:  Head CT 10/07/2023 and MRI 06/13/2016 FINDINGS: MRI HEAD FINDINGS Multiple sequences are severely motion degraded. Brain: As seen on CT, there is a large acute infarct in the inferior/posterior left MCA territory including extensive involvement of the temporal lobe as well as parietal and lateral occipital lobe involvement in addition to involvement of the insula. There is well-defined cytotoxic edema with regional sulcal effacement but no midline shift or other significant mass effect. There are also 1 or 2 punctate acute infarcts in the left frontal lobe. Scattered chronic microhemorrhages are present in the cerebellum, brainstem, and cerebral hemispheres including in the deep gray nuclei suggestive of chronic hypertension. There is a background of mild to moderate chronic small vessel ischemia in the cerebral white matter. There is advanced cerebral atrophy. No extra-axial fluid collection is evident.  Vascular: See below. Skull and upper cervical spine: Unremarkable bone marrow signal. Sinuses/Orbits: Sinusitis, including complete right maxillary sinus opacification. No gross orbital abnormality. No significant mastoid fluid. Other: None. MRA HEAD FINDINGS The study is severely motion degraded. Anterior circulation: The petrous portions of the internal carotid arteries were incompletely imaged. Both ICAs are patent from the included distal petrous segments through the termini with motion artifact limiting assessment of the cavernous segments, particularly in the regions of the anterior genu bilaterally. There is no evidence of a high-grade ICA stenosis elsewhere. The left M1 segment is patent proximally. There is severe motion artifact through the distal left M1 segment with an underlying severe stenosis or segmental occlusion not excluded. There is return of flow related enhancement within the proximal left M2 segments, however motion artifact severely limits branch vessel assessment and this study is inadequate for assessment of an M2 branch occlusion. The right MCA and both ACAs are patent proximally with severe motion artifact limiting assessment, particularly of the branch vessels. No large aneurysm is identified. Posterior circulation: The intracranial vertebral arteries were not imaged. The basilar artery is widely patent. Posterior communicating arteries are diminutive or absent. Both PCAs are widely patent proximally. There is the appearance severe mid and distal left P2 and bilateral P3 stenoses, although this may be in part artifactual due to motion. No aneurysm is identified. Anatomic variants: None. IMPRESSION: 1. Large acute left MCA infarct. 2. Severely motion degraded head MRA. Motion artifact through the distal left M1 segment with an underlying severe stenosis or segmental occlusion not excluded. The study is inadequate to assess for a possible M2 occlusion. 3. Possible severe stenoses versus  artifact in the left P2 and bilateral P3 segments. 4. Mild to moderate chronic small vessel ischemia and advanced cerebral atrophy. Electronically Signed   By: Sebastian Ache M.D.   On: 10/09/2023 13:50   ECHOCARDIOGRAM COMPLETE Result Date: 10/08/2023    ECHOCARDIOGRAM REPORT   Patient Name:   Dan Maker Date of Exam: 10/08/2023 Medical Rec #:  [id number]      Height:       65.0 in Accession #:    [id number]     Weight:       175.7 lb Date of Birth:  [birth year]      BSA:          1.872 m Patient Age:    82 years       BP:           103/46 mmHg Patient Gender: F  HR:           80 bpm. Exam Location:  [facility] Procedure: 2D Echo, Cardiac Doppler and Color Doppler (Both Spectral and Color            Flow Doppler were utilized during procedure). Indications:    Stroke l63.9  History:        Patient has prior history of Echocardiogram examinations, most                 recent 09/30/2021. Risk Factors:Dyslipidemia and Hypertension.                 Cancer (HCC) (From Hx), (HFpEF) heart failure with preserved                 ejection fraction/chronic Diastolic CHF.  Sonographer:    Denese Finn RCS Referring Phys: [phone] [place]  Sonographer Comments: Technically challenging study due to limited acoustic windows. IMPRESSIONS  1. Left ventricular ejection fraction, by estimation, is >75%. The left ventricle has hyperdynamic function. Small intracavitary gradient of 7 mm Hg and increased flow accerelation in the LVOT likely from hyperdynamic LV and severe LVH The left ventricle has no regional wall motion abnormalities. There is severe asymmetric left ventricular hypertrophy of the septal and basal segments. Left ventricular diastolic parameters are consistent with Grade I diastolic dysfunction (impaired relaxation). Elevated left ventricular end-diastolic pressure.  2. Right ventricular systolic function is normal. The right ventricular size is normal. Tricuspid regurgitation signal is inadequate  for assessing PA pressure.  3. The mitral valve is normal in structure. No evidence of mitral valve regurgitation. No evidence of mitral stenosis. The mean mitral valve gradient is 3.0 mmHg.  4. The aortic valve was not well visualized. There is moderate calcification of the aortic valve. Aortic valve regurgitation is not visualized. Mild to moderate aortic valve stenosis. Aortic valve area, by VTI measures 1.37 cm. Aortic valve mean gradient measures 16.0 mmHg. Aortic valve Vmax measures 2.75 m/s.  5. The inferior vena cava is normal in size with greater than 50% respiratory variability, suggesting right atrial pressure of 3 mmHg.  6. Increased flow velocities may be secondary to anemia, thyrotoxicosis, hyperdynamic or high flow state. FINDINGS  Left Ventricle: Left ventricular ejection fraction, by estimation, is >75%. The left ventricle has hyperdynamic function. The left ventricle has no regional wall motion abnormalities. Strain was performed and the global longitudinal strain is indeterminate. The left ventricular internal cavity size was small. There is severe asymmetric left ventricular hypertrophy of the septal and basal segments. Left ventricular diastolic parameters are consistent with Grade I diastolic dysfunction (impaired relaxation). Elevated left ventricular end-diastolic pressure. Right Ventricle: The right ventricular size is normal. No increase in right ventricular wall thickness. Right ventricular systolic function is normal. Tricuspid regurgitation signal is inadequate for assessing PA pressure. Left Atrium: Left atrial size was normal in size. Right Atrium: Right atrial size was normal in size. Pericardium: There is no evidence of pericardial effusion. Mitral Valve: The mitral valve is normal in structure. No evidence of mitral valve regurgitation. No evidence of mitral valve stenosis. MV peak gradient, 10.2 mmHg. The mean mitral valve gradient is 3.0 mmHg. Tricuspid Valve: The tricuspid valve  is normal in structure. Tricuspid valve regurgitation is trivial. No evidence of tricuspid stenosis. Aortic Valve: The aortic valve was not well visualized. There is moderate calcification of the aortic valve. Aortic valve regurgitation is not visualized. Mild to moderate aortic stenosis is present. Aortic valve mean  gradient measures 16.0 mmHg. Aortic valve peak gradient measures 30.2 mmHg. Aortic valve area, by VTI measures 1.37 cm. Pulmonic Valve: The pulmonic valve was not well visualized. Pulmonic valve regurgitation is not visualized. No evidence of pulmonic stenosis. Aorta: The aortic root is normal in size and structure. Venous: The inferior vena cava is normal in size with greater than 50% respiratory variability, suggesting right atrial pressure of 3 mmHg. IAS/Shunts: The atrial septum is grossly normal. Additional Comments: 3D was performed not requiring image post processing on an independent workstation and was indeterminate.  LEFT VENTRICLE PLAX 2D LVIDd:         3.80 cm   Diastology LVIDs:         2.40 cm   LV e' medial:    4.79 cm/s LV PW:         1.10 cm   LV E/e' medial:  29.2 LV IVS:        1.70 cm   LV e' lateral:   4.90 cm/s LVOT diam:     1.70 cm   LV E/e' lateral: 28.6 LV SV:         85 LV SV Index:   45 LVOT Area:     2.27 cm  RIGHT VENTRICLE RV S prime:     12.50 cm/s TAPSE (M-mode): 1.6 cm LEFT ATRIUM             Index        RIGHT ATRIUM           Index LA diam:        3.40 cm 1.82 cm/m   RA Area:     10.50 cm LA Vol (A2C):   63.2 ml 33.76 ml/m  RA Volume:   21.10 ml  11.27 ml/m LA Vol (A4C):   56.0 ml 29.91 ml/m LA Biplane Vol: 59.4 ml 31.73 ml/m  AORTIC VALVE AV Area (Vmax):    1.39 cm AV Area (Vmean):   1.57 cm AV Area (VTI):     1.37 cm AV Vmax:           275.00 cm/s AV Vmean:          184.000 cm/s AV VTI:            0.618 m AV Peak Grad:      30.2 mmHg AV Mean Grad:      16.0 mmHg LVOT Vmax:         168.00 cm/s LVOT Vmean:        127.000 cm/s LVOT VTI:          0.373 m  LVOT/AV VTI ratio: 0.60  AORTA Ao Root diam: 3.10 cm MITRAL VALVE MV Area (PHT): 1.93 cm     SHUNTS MV Area VTI:   2.09 cm     Systemic VTI:  0.37 m MV Peak grad:  10.2 mmHg    Systemic Diam: 1.70 cm MV Mean grad:  3.0 mmHg MV Vmax:       1.60 m/s MV Vmean:      80.2 cm/s MV Decel Time: 394 msec MV E velocity: 140.00 cm/s MV A velocity: 190.00 cm/s MV E/A ratio:  0.74 Vishnu Priya Mallipeddi Electronically signed by Lucetta Russel Mallipeddi Signature Date/Time: 10/08/2023/4:17:30 PM    Final    CT HEAD WO CONTRAST ( ) Addendum Date: 10/07/2023 ADDENDUM REPORT: 10/07/2023 22:10 ADDENDUM: These results were called by telephone at the time of interpretation on 10/07/2023 at 9:10 pm to provider Dr. Corinna Dickens, who verbally  acknowledged these results. Electronically Signed   By: Tyron Gallon M.D.   On: 10/07/2023 22:10   Result Date: 10/07/2023 CLINICAL DATA:  Mental status change. EXAM: CT HEAD WITHOUT CONTRAST TECHNIQUE: Contiguous axial images were obtained from the base of the skull through the vertex without intravenous contrast. RADIATION DOSE REDUCTION: This exam was performed according to the departmental dose-optimization program which includes automated exposure control, adjustment of the mA and/or kV according to patient size and/or use of iterative reconstruction technique. COMPARISON:  MRI brain 06/13/2016. FINDINGS: Brain: There is hypodensity, loss of gray-white matter distinction and mild sulcal effacement in the left temporoparietal region worrisome for acute infarct. There is no significant midline shift. No acute intracranial hemorrhage or extra-axial fluid collection. Moderate diffuse atrophy with compensatory dilatation of ventricular system appears unchanged from 2017. Vascular: Atherosclerotic calcifications are present within the cavernous internal carotid arteries. Skull: Normal. Negative for fracture or focal lesion. Sinuses/Orbits: There are air-fluid levels in the bilateral frontal sinuses.  There is complete opacification of the right maxillary sinus and mucosal thickening of ethmoid air cells and sphenoid sinuses. Orbits are within normal limits. Other: None. IMPRESSION: 1. Hypodensity, loss of gray-white matter distinction and mild sulcal effacement in the left temporoparietal region worrisome for acute infarct. No acute intracranial hemorrhage. 2. Moderate diffuse atrophy. 3. Air-fluid levels in the bilateral frontal sinuses. Correlate for acute sinusitis. Electronically Signed: By: Tyron Gallon M.D. On: 10/07/2023 20:20   US  PELVIS (TRANSABDOMINAL ONLY) Result Date: 10/06/2023 CLINICAL DATA:  Vaginal bleeding EXAM: TRANSABDOMINAL ULTRASOUND OF PELVIS TECHNIQUE: Transabdominal ultrasound examination of the pelvis was performed including evaluation of the uterus, ovaries, adnexal regions, and pelvic cul-de-sac. COMPARISON:  05/24/2015 FINDINGS: Uterus Measurements: 11.2 x 5.0 x 5.9 cm = volume: 172 mL. No fibroids or other mass visualized. Endometrium Thickness: Question septate uterus. There appears to be 2 limbs of the endometrium measuring 1.4 cm and 1.2 cm, thickened. No focal abnormality visualized. Right ovary Measurements: 1.5 x 1.1 x 1.1 cm = volume: 0.9 mL. Normal appearance/no adnexal mass. Left ovary Measurements: 1.5 x 1.0 x 2.0 cm = volume: 1.5 mL. Normal appearance/no adnexal mass. Other findings:  Trace free fluid in the cul-de-sac. IMPRESSION: There appears to be septate uterus. Both limbs of the endometrium are thickened for postmenopausal state. In the setting of post-menopausal bleeding, endometrial sampling is indicated to exclude carcinoma. If results are benign, sonohysterogram should be considered for focal lesion work-up. (Ref: Radiological Reasoning: Algorithmic Workup of Abnormal Vaginal Bleeding with Endovaginal Sonography and Sonohysterography. AJR 2008; 295:J88-41) Electronically Signed   By: Janeece Mechanic M.D.   On: 10/06/2023 22:08    Demaris Fillers, DO  [facility]  If 7PM-7AM, please contact night-coverage [email or web address] Password [email or web address] 10/11/2023, 6:16 PM   LOS: 3 days

## 2023-10-12 DIAGNOSIS — I639 Cerebral infarction, unspecified: Secondary | ICD-10-CM | POA: Diagnosis not present

## 2023-10-12 DIAGNOSIS — N939 Abnormal uterine and vaginal bleeding, unspecified: Secondary | ICD-10-CM | POA: Diagnosis not present

## 2023-10-12 NOTE — Plan of Care (Signed)
  Problem: Clinical Measurements: Goal: Ability to maintain clinical measurements within normal limits will improve Outcome: Progressing Goal: Will remain free from infection Outcome: Progressing Goal: Diagnostic test results will improve Outcome: Progressing Goal: Respiratory complications will improve Outcome: Progressing Goal: Cardiovascular complication will be avoided Outcome: Progressing   Problem: Activity: Goal: Risk for activity intolerance will decrease Outcome: Progressing   Problem: Nutrition: Goal: Adequate nutrition will be maintained Outcome: Progressing   Problem: Coping: Goal: Level of anxiety will decrease Outcome: Progressing   Problem: Elimination: Goal: Will not experience complications related to bowel motility Outcome: Progressing Goal: Will not experience complications related to urinary retention Outcome: Progressing   Problem: Pain Managment: Goal: General experience of comfort will improve and/or be controlled Outcome: Progressing   Problem: Safety: Goal: Ability to remain free from injury will improve Outcome: Progressing   Problem: Skin Integrity: Goal: Risk for impaired skin integrity will decrease Outcome: Progressing   Problem: Education: Goal: Knowledge of disease or condition will improve Outcome: Progressing Goal: Knowledge of secondary prevention will improve (MUST DOCUMENT ALL) Outcome: Progressing Goal: Knowledge of patient specific risk factors will improve (DELETE if not current risk factor) Outcome: Progressing   Problem: Ischemic Stroke/TIA Tissue Perfusion: Goal: Complications of ischemic stroke/TIA will be minimized Outcome: Progressing   Problem: Coping: Goal: Will verbalize positive feelings about self Outcome: Progressing Goal: Will identify appropriate support needs Outcome: Progressing   Problem: Health Behavior/Discharge Planning: Goal: Ability to manage health-related needs will improve Outcome:  Progressing Goal: Goals will be collaboratively established with patient/family Outcome: Progressing   Problem: Self-Care: Goal: Ability to participate in self-care as condition permits will improve Outcome: Progressing Goal: Verbalization of feelings and concerns over difficulty with self-care will improve Outcome: Progressing Goal: Ability to communicate needs accurately will improve Outcome: Progressing   Problem: Nutrition: Goal: Risk of aspiration will decrease Outcome: Progressing Goal: Dietary intake will improve Outcome: Progressing

## 2023-10-12 NOTE — Progress Notes (Signed)
 Report called to [facility], patient transported via EMS. Patient's son notified of patient transport to [facility].

## 2023-10-12 NOTE — Discharge Summary (Signed)
 Physician Discharge Summary   Patient: Tammy Terry MRN: [id number] DOB: 1941-04-27  Admit date:     10/06/2023  Discharge date: 10/12/23  Discharge Physician: Myrtie Atkinson Raelie Lohr   PCP: Omie Bickers, MD   Recommendations at discharge:  TRANSITION TO [facility] Course: 83 y.o. adult with medical history significant for DVT on apixaban , aortic stenosis, hypertension, diastolic CHF.  Patient presented to the ED with complaints of vaginal bleeding over the past week, that has been increasing.  Patient has cognitive deficits, and is unable to give details of how much blood she saw.  But she is able to answer simple questions, she denies dizziness, no difficulty breathing, no chest pain.    Apixaban  was reversed in the ED Gynecology was consulted, recommendations: tranexamic acid  given She continued to bleed, Hg dropped to 5.7. PRBC transfusion given (2 units) AMS worsened and CT and MRI demonstrated acute large right MCA CVA Pt continued to have heavy vaginal bleeding.  Dr. Randolm [place] consulted, felt she likely has endometrial carcinoma with limited treatment options to stop vaginal bleeding.  Palliative medicine consulted and patient was transitioned to full comfort measures on 10/09/23.   The patient had poor po intake and continued to have vaginal bleeding.  Full comfort measures were continued.  On 10/12/23, patient was evaluated by [facility] whom  felt patient was an appropriate candidate for residential hospice.  Patient remained hemodynamically stable to transfer to [facility] with which son agreeded.  Assessment and Plan: Postmenopausal vaginal bleeding - Severe Acute blood loss Anemia  - pt continues to have large blood clots and vaginal bleeding despite treatments - s/p 1 dose of tranexamic acid  on 4/13 and repeated dose on 4/13  - hg down to 5.7 ordered 2 units PRBC 4/14 - I called and requested assistance from Gyn on call Dr. Randolm [place] for consultation, treatment options are  very  limited due to acute CVA, not able to give IV premarin due to hypercoagulable state with acute CVA - given this is mostly likely an endometrial malignancy in addition to acute CVA with ongoing severe hemorrhage requiring transfusions and inability to anticoagulate I have asked for a palliative medicine consultation for goals of care as her prognosis is extremely limited given no good options to manage hemorrhage and she would be a poor candidate and could not tolerate aggressive treatments for malignancy. I updated son at bedside and after conference with palliative medicine with patient's sister on telephone, transition made to full comfort measures -pt continues to have vaginal bleeding, but now transitioned to full comfort measures   Large Acute left MCA ischemic stroke - CT scan done on 4/13 concerning for acute CVA - MRI / MRA brain confirms large acute left MCA CVA  - not a candidate for anticoagulation or antiplatelets due to ongoing severe vaginal bleeding  - palliative medicine consultation meeting with family today with transition to full comfort measures -LDL 84 -4/14 A1C--5.7 -4/14 Echo--EF >75%, G1DD, normal RVF, mild-mod AS   History of DVT  - s/p reversal of apixaban  given in ED - discontinued apixaban   - now full comfort measures   Aortic stenosis - stable    Chronic HFpEF - stable and compensated - -4/14 Echo--EF >75%, G1DD, normal RVF, mild-mod AS   Major neurocognitive disorder  - pt has significant cognitive deficits and resides in memory care - delirium precautions advised    Goals of Care -4/17--reconfirmed full comfort measures with son -if patient remains stable hemodynamically,  he is amendable for residential hospice--will ask them to eval for feasibility -4/18--hospice evaluated patient and spoke with son;  felt patient was appropriate for residential hospice with which agrees --pt remains stable to transfer to [facility] house         Consultants:  palliative medicine Procedures performed: none  Disposition: Residential Hospice Diet recommendation:  Comfort Feeds DISCHARGE MEDICATION: Allergies as of 10/12/2023       Reactions   Amoxil [amoxicillin] Other (See Comments)   Unknown reaction   Codeine    Unknown reaction   Penicillins Other (See Comments)   Unknown reaction - confirmed no anaphylaxis, though.        Medication List     STOP taking these medications    acetaminophen  325 MG tablet Commonly known as: TYLENOL    apixaban  5 MG Tabs tablet Commonly known as: ELIQUIS    ascorbic acid  500 MG tablet Commonly known as: VITAMIN C   guaiFENesin 600 MG 12 hr tablet Commonly known as: MUCINEX   metoprolol  succinate 50 MG 24 hr tablet Commonly known as: TOPROL -XL   multivitamin tablet   nitrofurantoin  (macrocrystal-monohydrate) 100 MG capsule Commonly known as: MACROBID    nystatin  powder Commonly known as: MYCOSTATIN /NYSTOP    ondansetron  4 MG tablet Commonly known as: ZOFRAN    pantoprazole  40 MG tablet Commonly known as: PROTONIX    sertraline 25 MG tablet Commonly known as: ZOLOFT   zinc  sulfate (50mg  elemental zinc ) 220 (50 Zn) MG capsule        Follow-up Information     [facility]. Schedule an appointment as soon as possible for a visit in 1 week.   Specialty: Obstetrics and Gynecology Contact information: [address] [place]  [address] [phone]               Discharge Exam: Cleavon Curls Weights   10/07/23 1540 10/07/23 1541 10/08/23 0320  Weight: 79.4 kg 79.3 kg 79.7 kg   HEENT:  [place]/AT, No thrush, no icterus CV:  RRR, no rub, no S3, no S4 Lung:  bibasilar rales.  No wheeze Abd:  soft/+BS, NT Ext:  No edema, no lymphangitis, no synovitis, no rash   Condition at discharge: stable  The results of significant diagnostics from this hospitalization (including imaging, microbiology, ancillary and laboratory) are listed below for reference.    Imaging Studies: MR BRAIN WO CONTRAST Result Date: 10/09/2023 CLINICAL DATA:  Stroke/TIA, determine embolic source; Neuro deficit, acute, stroke suspected. EXAM: MRI HEAD WITHOUT CONTRAST MRA HEAD WITHOUT CONTRAST TECHNIQUE: Multiplanar, multi-echo pulse sequences of the brain and surrounding structures were acquired without intravenous contrast. Angiographic images of the Circle of Willis were acquired using MRA technique without intravenous contrast. COMPARISON:  Head CT 10/07/2023 and MRI 06/13/2016 FINDINGS: MRI HEAD FINDINGS Multiple sequences are severely motion degraded. Brain: As seen on CT, there is a large acute infarct in the inferior/posterior left MCA territory including extensive involvement of the temporal lobe as well as parietal and lateral occipital lobe involvement in addition to involvement of the insula. There is well-defined cytotoxic edema with regional sulcal effacement but no midline shift or other significant mass effect. There are also 1 or 2 punctate acute infarcts in the left frontal lobe. Scattered chronic microhemorrhages are present in the cerebellum, brainstem, and cerebral hemispheres including in the deep gray nuclei suggestive of chronic hypertension. There is a background of mild to moderate chronic small vessel ischemia in the cerebral white matter. There is advanced cerebral atrophy. No extra-axial fluid collection is evident.  Vascular: See below. Skull and upper cervical spine: Unremarkable bone marrow signal. Sinuses/Orbits: Sinusitis, including complete right maxillary sinus opacification. No gross orbital abnormality. No significant mastoid fluid. Other: None. MRA HEAD FINDINGS The study is severely motion degraded. Anterior circulation: The petrous portions of the internal carotid arteries were incompletely imaged. Both ICAs are patent from the included distal petrous segments through the termini with motion artifact limiting assessment of the cavernous segments,  particularly in the regions of the anterior genu bilaterally. There is no evidence of a high-grade ICA stenosis elsewhere. The left M1 segment is patent proximally. There is severe motion artifact through the distal left M1 segment with an underlying severe stenosis or segmental occlusion not excluded. There is return of flow related enhancement within the proximal left M2 segments, however motion artifact severely limits branch vessel assessment and this study is inadequate for assessment of an M2 branch occlusion. The right MCA and both ACAs are patent proximally with severe motion artifact limiting assessment, particularly of the branch vessels. No large aneurysm is identified. Posterior circulation: The intracranial vertebral arteries were not imaged. The basilar artery is widely patent. Posterior communicating arteries are diminutive or absent. Both PCAs are widely patent proximally. There is the appearance severe mid and distal left P2 and bilateral P3 stenoses, although this may be in part artifactual due to motion. No aneurysm is identified. Anatomic variants: None. IMPRESSION: 1. Large acute left MCA infarct. 2. Severely motion degraded head MRA. Motion artifact through the distal left M1 segment with an underlying severe stenosis or segmental occlusion not excluded. The study is inadequate to assess for a possible M2 occlusion. 3. Possible severe stenoses versus artifact in the left P2 and bilateral P3 segments. 4. Mild to moderate chronic small vessel ischemia and advanced cerebral atrophy. Electronically Signed   By: Aundra Lee M.D.   On: 10/09/2023 13:50   MR ANGIO HEAD WO CONTRAST Result Date: 10/09/2023 CLINICAL DATA:  Stroke/TIA, determine embolic source; Neuro deficit, acute, stroke suspected. EXAM: MRI HEAD WITHOUT CONTRAST MRA HEAD WITHOUT CONTRAST TECHNIQUE: Multiplanar, multi-echo pulse sequences of the brain and surrounding structures were acquired without intravenous contrast.  Angiographic images of the Circle of Willis were acquired using MRA technique without intravenous contrast. COMPARISON:  Head CT 10/07/2023 and MRI 06/13/2016 FINDINGS: MRI HEAD FINDINGS Multiple sequences are severely motion degraded. Brain: As seen on CT, there is a large acute infarct in the inferior/posterior left MCA territory including extensive involvement of the temporal lobe as well as parietal and lateral occipital lobe involvement in addition to involvement of the insula. There is well-defined cytotoxic edema with regional sulcal effacement but no midline shift or other significant mass effect. There are also 1 or 2 punctate acute infarcts in the left frontal lobe. Scattered chronic microhemorrhages are present in the cerebellum, brainstem, and cerebral hemispheres including in the deep gray nuclei suggestive of chronic hypertension. There is a background of mild to moderate chronic small vessel ischemia in the cerebral white matter. There is advanced cerebral atrophy. No extra-axial fluid collection is evident. Vascular: See below. Skull and upper cervical spine: Unremarkable bone marrow signal. Sinuses/Orbits: Sinusitis, including complete right maxillary sinus opacification. No gross orbital abnormality. No significant mastoid fluid. Other: None. MRA HEAD FINDINGS The study is severely motion degraded. Anterior circulation: The petrous portions of the internal carotid arteries were incompletely imaged. Both ICAs are patent from the included distal petrous segments through the termini with motion artifact limiting assessment of the cavernous segments, particularly in  the regions of the anterior genu bilaterally. There is no evidence of a high-grade ICA stenosis elsewhere. The left M1 segment is patent proximally. There is severe motion artifact through the distal left M1 segment with an underlying severe stenosis or segmental occlusion not excluded. There is return of flow related enhancement within the  proximal left M2 segments, however motion artifact severely limits branch vessel assessment and this study is inadequate for assessment of an M2 branch occlusion. The right MCA and both ACAs are patent proximally with severe motion artifact limiting assessment, particularly of the branch vessels. No large aneurysm is identified. Posterior circulation: The intracranial vertebral arteries were not imaged. The basilar artery is widely patent. Posterior communicating arteries are diminutive or absent. Both PCAs are widely patent proximally. There is the appearance severe mid and distal left P2 and bilateral P3 stenoses, although this may be in part artifactual due to motion. No aneurysm is identified. Anatomic variants: None. IMPRESSION: 1. Large acute left MCA infarct. 2. Severely motion degraded head MRA. Motion artifact through the distal left M1 segment with an underlying severe stenosis or segmental occlusion not excluded. The study is inadequate to assess for a possible M2 occlusion. 3. Possible severe stenoses versus artifact in the left P2 and bilateral P3 segments. 4. Mild to moderate chronic small vessel ischemia and advanced cerebral atrophy. Electronically Signed   By: Aundra Lee M.D.   On: 10/09/2023 13:50   ECHOCARDIOGRAM COMPLETE Result Date: 10/08/2023    ECHOCARDIOGRAM REPORT   Patient Name:   Tiana Flurry Date of Exam: 10/08/2023 Medical Rec #:  [id number]      Height:       65.0 in Accession #:    [id number]     Weight:       175.7 lb Date of Birth:  [birth year]      BSA:          1.872 m Patient Age:    82 years       BP:           103/46 mmHg Patient Gender: F              HR:           80 bpm. Exam Location:  [facility] Procedure: 2D Echo, Cardiac Doppler and Color Doppler (Both Spectral and Color            Flow Doppler were utilized during procedure). Indications:    Stroke l63.9  History:        Patient has prior history of Echocardiogram examinations, most                 recent 09/30/2021.  Risk Factors:Dyslipidemia and Hypertension.                 Cancer (HCC) (From Hx), (HFpEF) heart failure with preserved                 ejection fraction/chronic Diastolic CHF.  Sonographer:    Denese Finn RCS Referring Phys: [phone] [place]  Sonographer Comments: Technically challenging study due to limited acoustic windows. IMPRESSIONS  1. Left ventricular ejection fraction, by estimation, is >75%. The left ventricle has hyperdynamic function. Small intracavitary gradient of 7 mm Hg and increased flow accerelation in the LVOT likely from hyperdynamic LV and severe LVH The left ventricle has no regional wall motion abnormalities. There is severe asymmetric left ventricular hypertrophy of the septal and basal segments. Left ventricular diastolic parameters are  consistent with Grade I diastolic dysfunction (impaired relaxation). Elevated left ventricular end-diastolic pressure.  2. Right ventricular systolic function is normal. The right ventricular size is normal. Tricuspid regurgitation signal is inadequate for assessing PA pressure.  3. The mitral valve is normal in structure. No evidence of mitral valve regurgitation. No evidence of mitral stenosis. The mean mitral valve gradient is 3.0 mmHg.  4. The aortic valve was not well visualized. There is moderate calcification of the aortic valve. Aortic valve regurgitation is not visualized. Mild to moderate aortic valve stenosis. Aortic valve area, by VTI measures 1.37 cm. Aortic valve mean gradient measures 16.0 mmHg. Aortic valve Vmax measures 2.75 m/s.  5. The inferior vena cava is normal in size with greater than 50% respiratory variability, suggesting right atrial pressure of 3 mmHg.  6. Increased flow velocities may be secondary to anemia, thyrotoxicosis, hyperdynamic or high flow state. FINDINGS  Left Ventricle: Left ventricular ejection fraction, by estimation, is >75%. The left ventricle has hyperdynamic function. The left ventricle has no regional  wall motion abnormalities. Strain was performed and the global longitudinal strain is indeterminate. The left ventricular internal cavity size was small. There is severe asymmetric left ventricular hypertrophy of the septal and basal segments. Left ventricular diastolic parameters are consistent with Grade I diastolic dysfunction (impaired relaxation). Elevated left ventricular end-diastolic pressure. Right Ventricle: The right ventricular size is normal. No increase in right ventricular wall thickness. Right ventricular systolic function is normal. Tricuspid regurgitation signal is inadequate for assessing PA pressure. Left Atrium: Left atrial size was normal in size. Right Atrium: Right atrial size was normal in size. Pericardium: There is no evidence of pericardial effusion. Mitral Valve: The mitral valve is normal in structure. No evidence of mitral valve regurgitation. No evidence of mitral valve stenosis. MV peak gradient, 10.2 mmHg. The mean mitral valve gradient is 3.0 mmHg. Tricuspid Valve: The tricuspid valve is normal in structure. Tricuspid valve regurgitation is trivial. No evidence of tricuspid stenosis. Aortic Valve: The aortic valve was not well visualized. There is moderate calcification of the aortic valve. Aortic valve regurgitation is not visualized. Mild to moderate aortic stenosis is present. Aortic valve mean gradient measures 16.0 mmHg. Aortic valve peak gradient measures 30.2 mmHg. Aortic valve area, by VTI measures 1.37 cm. Pulmonic Valve: The pulmonic valve was not well visualized. Pulmonic valve regurgitation is not visualized. No evidence of pulmonic stenosis. Aorta: The aortic root is normal in size and structure. Venous: The inferior vena cava is normal in size with greater than 50% respiratory variability, suggesting right atrial pressure of 3 mmHg. IAS/Shunts: The atrial septum is grossly normal. Additional Comments: 3D was performed not requiring image post processing on an  independent workstation and was indeterminate.  LEFT VENTRICLE PLAX 2D LVIDd:         3.80 cm   Diastology LVIDs:         2.40 cm   LV e' medial:    4.79 cm/s LV PW:         1.10 cm   LV E/e' medial:  29.2 LV IVS:        1.70 cm   LV e' lateral:   4.90 cm/s LVOT diam:     1.70 cm   LV E/e' lateral: 28.6 LV SV:         85 LV SV Index:   45 LVOT Area:     2.27 cm  RIGHT VENTRICLE RV S prime:     12.50 cm/s TAPSE (M-mode):  1.6 cm LEFT ATRIUM             Index        RIGHT ATRIUM           Index LA diam:        3.40 cm 1.82 cm/m   RA Area:     10.50 cm LA Vol (A2C):   63.2 ml 33.76 ml/m  RA Volume:   21.10 ml  11.27 ml/m LA Vol (A4C):   56.0 ml 29.91 ml/m LA Biplane Vol: 59.4 ml 31.73 ml/m  AORTIC VALVE AV Area (Vmax):    1.39 cm AV Area (Vmean):   1.57 cm AV Area (VTI):     1.37 cm AV Vmax:           275.00 cm/s AV Vmean:          184.000 cm/s AV VTI:            0.618 m AV Peak Grad:      30.2 mmHg AV Mean Grad:      16.0 mmHg LVOT Vmax:         168.00 cm/s LVOT Vmean:        127.000 cm/s LVOT VTI:          0.373 m LVOT/AV VTI ratio: 0.60  AORTA Ao Root diam: 3.10 cm MITRAL VALVE MV Area (PHT): 1.93 cm     SHUNTS MV Area VTI:   2.09 cm     Systemic VTI:  0.37 m MV Peak grad:  10.2 mmHg    Systemic Diam: 1.70 cm MV Mean grad:  3.0 mmHg MV Vmax:       1.60 m/s MV Vmean:      80.2 cm/s MV Decel Time: 394 msec MV E velocity: 140.00 cm/s MV A velocity: 190.00 cm/s MV E/A ratio:  0.74 Vishnu Priya Mallipeddi Electronically signed by Lucetta Russel Mallipeddi Signature Date/Time: 10/08/2023/4:17:30 PM    Final    CT HEAD WO CONTRAST ( ) Addendum Date: 10/07/2023 ADDENDUM REPORT: 10/07/2023 22:10 ADDENDUM: These results were called by telephone at the time of interpretation on 10/07/2023 at 9:10 pm to provider Dr. Corinna Dickens, who verbally acknowledged these results. Electronically Signed   By: Tyron Gallon M.D.   On: 10/07/2023 22:10   Result Date: 10/07/2023 CLINICAL DATA:  Mental status change. EXAM: CT HEAD  WITHOUT CONTRAST TECHNIQUE: Contiguous axial images were obtained from the base of the skull through the vertex without intravenous contrast. RADIATION DOSE REDUCTION: This exam was performed according to the departmental dose-optimization program which includes automated exposure control, adjustment of the mA and/or kV according to patient size and/or use of iterative reconstruction technique. COMPARISON:  MRI brain 06/13/2016. FINDINGS: Brain: There is hypodensity, loss of gray-white matter distinction and mild sulcal effacement in the left temporoparietal region worrisome for acute infarct. There is no significant midline shift. No acute intracranial hemorrhage or extra-axial fluid collection. Moderate diffuse atrophy with compensatory dilatation of ventricular system appears unchanged from 2017. Vascular: Atherosclerotic calcifications are present within the cavernous internal carotid arteries. Skull: Normal. Negative for fracture or focal lesion. Sinuses/Orbits: There are air-fluid levels in the bilateral frontal sinuses. There is complete opacification of the right maxillary sinus and mucosal thickening of ethmoid air cells and sphenoid sinuses. Orbits are within normal limits. Other: None. IMPRESSION: 1. Hypodensity, loss of gray-white matter distinction and mild sulcal effacement in the left temporoparietal region worrisome for acute infarct. No acute intracranial hemorrhage. 2. Moderate diffuse atrophy. 3. Air-fluid levels  in the bilateral frontal sinuses. Correlate for acute sinusitis. Electronically Signed: By: Tyron Gallon M.D. On: 10/07/2023 20:20   US  PELVIS (TRANSABDOMINAL ONLY) Result Date: 10/06/2023 CLINICAL DATA:  Vaginal bleeding EXAM: TRANSABDOMINAL ULTRASOUND OF PELVIS TECHNIQUE: Transabdominal ultrasound examination of the pelvis was performed including evaluation of the uterus, ovaries, adnexal regions, and pelvic cul-de-sac. COMPARISON:  05/24/2015 FINDINGS: Uterus Measurements: 11.2 x  5.0 x 5.9 cm = volume: 172 mL. No fibroids or other mass visualized. Endometrium Thickness: Question septate uterus. There appears to be 2 limbs of the endometrium measuring 1.4 cm and 1.2 cm, thickened. No focal abnormality visualized. Right ovary Measurements: 1.5 x 1.1 x 1.1 cm = volume: 0.9 mL. Normal appearance/no adnexal mass. Left ovary Measurements: 1.5 x 1.0 x 2.0 cm = volume: 1.5 mL. Normal appearance/no adnexal mass. Other findings:  Trace free fluid in the cul-de-sac. IMPRESSION: There appears to be septate uterus. Both limbs of the endometrium are thickened for postmenopausal state. In the setting of post-menopausal bleeding, endometrial sampling is indicated to exclude carcinoma. If results are benign, sonohysterogram should be considered for focal lesion work-up. (Ref: Radiological Reasoning: Algorithmic Workup of Abnormal Vaginal Bleeding with Endovaginal Sonography and Sonohysterography. AJR 2008; [id number]:J19-14) Electronically Signed   By: Janeece Mechanic M.D.   On: 10/06/2023 22:08    Microbiology: Results for orders placed or performed during the hospital encounter of 10/06/23  MRSA Next Gen by PCR, Nasal     Status: None   Collection Time: 10/08/23  3:00 AM   Specimen: Nasal Mucosa; Nasal Swab  Result Value Ref Range Status   MRSA by PCR Next Gen NOT DETECTED NOT DETECTED Final    Comment: (NOTE) The GeneXpert MRSA Assay (FDA approved for NASAL specimens only), is one component of a comprehensive MRSA colonization surveillance program. It is not intended to diagnose MRSA infection nor to guide or monitor treatment for MRSA infections. Test performance is not FDA approved in patients less than 31 years old. Performed at [facility], [address], [place], [place] [address]     Labs: CBC: Recent Labs  Lab 10/06/23 2053 10/07/23 0302 10/07/23 1022 10/07/23 1549 10/08/23 0114 10/08/23 0946 10/09/23 0423  WBC 5.5   < > 7.4 10.4 9.1 9.5 6.5  NEUTROABS 3.5  --   --   --   --    --   --   HGB 8.9*   < > 7.8* 7.2* 5.7* 8.6* 7.0*  HCT 29.1*   < > 25.4* 23.7* 18.1* 26.4* 22.8*  MCV 100.3*   < > 100.8* 102.2* 98.9 93.3 97.4  PLT 145*   < > 139* 155 141* 141* 109*   < > = values in this interval not displayed.   Basic Metabolic Panel: Recent Labs  Lab 10/06/23 2053 10/09/23 0423  NA 141 140  K 4.2 4.0  CL 104 107  CO2 28 27  GLUCOSE 108* 99  BUN 23 27*  CREATININE 0.81 0.68  CALCIUM 9.3 8.2*  MG  --  2.0   Liver Function Tests: Recent Labs  Lab 10/06/23 2053  AST 16  ALT 12  ALKPHOS 96  BILITOT 0.5  PROT 6.6  ALBUMIN 3.4*   CBG: No results for input(s): "GLUCAP" in the last 168 hours.  Discharge time spent: greater than 30 minutes.  Signed: Demaris Fillers, MD [facility] 10/12/2023

## 2023-10-12 NOTE — TOC Progression Note (Addendum)
 Transition of Care [facility]) - Progression Note    Patient Details  Name: Tammy Terry MRN: [id number] Date of Birth: [birth year]  Transition [facility] CM/SW Contact  Katrine Parody, [place] Phone Number: 10/12/2023, 9:32 AM  Clinical Narrative:     CSW referred pt to [facility] via [employer], then followed up with a message to the provider that referring pt for residential hospice.  TOC to continue to follow.   Addendum: Pt contacted by [facility]- pt accepted, clarified that son was present in room during assessment and is in agreement.  CSW  followed up with MD, CSW then completed MN document and added pt to Transport list.  CSW spoke to son Andy Bannister, advised him, he is aware. He asked for a call of when she leaves if he is no longer at hospital.  CSW agreed that she or RN staff would call.   [place] to RN advising that pt is on transport list, and that son would like a call verifying that pt has left, should he no longer be at the hospital.  No further TOC needs at this time.   Expected Discharge Plan: Assisted Living Barriers to Discharge: Hospice Bed not available  Expected Discharge Plan and Services In-house Referral: Clinical Social Work Discharge Planning Services: CM Consult Post Acute Care Choice: Resumption of Svcs/PTA Provider Living arrangements for the past 2 months: Assisted Living Facility                                       Social Determinants of Health (SDOH) Interventions SDOH Screenings   Food Insecurity: Patient Unable To Answer (10/07/2023)  Housing: Patient Unable To Answer (10/07/2023)  Transportation Needs: Patient Unable To Answer (10/07/2023)  Utilities: Patient Unable To Answer (10/07/2023)  Recent Concern: Utilities - At Risk (10/04/2023)  Alcohol  Screen: Low Risk  (10/04/2023)  Depression (PHQ2-9): Low Risk  (10/04/2023)  Financial Resource Strain: Low Risk  (10/04/2023)  Physical Activity: Inactive (10/04/2023)  Social Connections:  Patient Unable To Answer (10/07/2023)  Recent Concern: Social Connections - Socially Isolated (10/04/2023)  Stress: No Stress Concern Present (10/04/2023)  Tobacco Use: Low Risk  (10/08/2023)    Readmission Risk Interventions    10/09/2023   10:27 AM  Readmission Risk Prevention Plan  Transportation Screening Complete  HRI or Home Care Consult Complete  Social Work Consult for Recovery Care Planning/Counseling Complete  Palliative Care Screening Complete  Medication Review Oceanographer) Complete

## 2023-10-25 DEATH — deceased
